# Patient Record
Sex: Male | Born: 1965 | Race: White | Hispanic: No | Marital: Married | Smoking: Never smoker
Health system: Southern US, Community
[De-identification: ages and names within clinical notes are randomized; demographics above are authoritative.]

## PROBLEM LIST (undated history)

## (undated) DIAGNOSIS — I341 Nonrheumatic mitral (valve) prolapse: Secondary | ICD-10-CM

## (undated) DIAGNOSIS — R490 Dysphonia: Secondary | ICD-10-CM

## (undated) DIAGNOSIS — M51369 Other intervertebral disc degeneration, lumbar region without mention of lumbar back pain or lower extremity pain: Secondary | ICD-10-CM

## (undated) DIAGNOSIS — K625 Hemorrhage of anus and rectum: Secondary | ICD-10-CM

## (undated) DIAGNOSIS — M1990 Unspecified osteoarthritis, unspecified site: Secondary | ICD-10-CM

## (undated) DIAGNOSIS — E785 Hyperlipidemia, unspecified: Secondary | ICD-10-CM

## (undated) DIAGNOSIS — D689 Coagulation defect, unspecified: Secondary | ICD-10-CM

## (undated) DIAGNOSIS — F329 Major depressive disorder, single episode, unspecified: Secondary | ICD-10-CM

## (undated) DIAGNOSIS — K5900 Constipation, unspecified: Secondary | ICD-10-CM

## (undated) DIAGNOSIS — M51379 Other intervertebral disc degeneration, lumbosacral region without mention of lumbar back pain or lower extremity pain: Secondary | ICD-10-CM

## (undated) DIAGNOSIS — IMO0002 Reserved for concepts with insufficient information to code with codable children: Secondary | ICD-10-CM

## (undated) DIAGNOSIS — R197 Diarrhea, unspecified: Secondary | ICD-10-CM

## (undated) DIAGNOSIS — D1720 Benign lipomatous neoplasm of skin and subcutaneous tissue of unspecified limb: Secondary | ICD-10-CM

## (undated) DIAGNOSIS — K279 Peptic ulcer, site unspecified, unspecified as acute or chronic, without hemorrhage or perforation: Secondary | ICD-10-CM

## (undated) DIAGNOSIS — R51 Headache: Secondary | ICD-10-CM

## (undated) DIAGNOSIS — D649 Anemia, unspecified: Secondary | ICD-10-CM

## (undated) DIAGNOSIS — R0602 Shortness of breath: Secondary | ICD-10-CM

## (undated) DIAGNOSIS — R140 Abdominal distension (gaseous): Secondary | ICD-10-CM

## (undated) DIAGNOSIS — F32A Depression, unspecified: Secondary | ICD-10-CM

## (undated) DIAGNOSIS — Z9889 Other specified postprocedural states: Secondary | ICD-10-CM

## (undated) DIAGNOSIS — G8929 Other chronic pain: Secondary | ICD-10-CM

## (undated) DIAGNOSIS — R112 Nausea with vomiting, unspecified: Secondary | ICD-10-CM

## (undated) DIAGNOSIS — N529 Male erectile dysfunction, unspecified: Secondary | ICD-10-CM

## (undated) DIAGNOSIS — G4733 Obstructive sleep apnea (adult) (pediatric): Secondary | ICD-10-CM

## (undated) DIAGNOSIS — M5030 Other cervical disc degeneration, unspecified cervical region: Secondary | ICD-10-CM

## (undated) DIAGNOSIS — K219 Gastro-esophageal reflux disease without esophagitis: Secondary | ICD-10-CM

## (undated) DIAGNOSIS — R39198 Other difficulties with micturition: Secondary | ICD-10-CM

## (undated) DIAGNOSIS — R1310 Dysphagia, unspecified: Secondary | ICD-10-CM

## (undated) DIAGNOSIS — R519 Headache, unspecified: Secondary | ICD-10-CM

## (undated) DIAGNOSIS — D126 Benign neoplasm of colon, unspecified: Secondary | ICD-10-CM

## (undated) DIAGNOSIS — M5135 Other intervertebral disc degeneration, thoracolumbar region: Secondary | ICD-10-CM

## (undated) DIAGNOSIS — Z8489 Family history of other specified conditions: Secondary | ICD-10-CM

## (undated) DIAGNOSIS — R002 Palpitations: Secondary | ICD-10-CM

## (undated) DIAGNOSIS — R0981 Nasal congestion: Secondary | ICD-10-CM

## (undated) DIAGNOSIS — I2699 Other pulmonary embolism without acute cor pulmonale: Secondary | ICD-10-CM

## (undated) DIAGNOSIS — M5137 Other intervertebral disc degeneration, lumbosacral region: Secondary | ICD-10-CM

## (undated) DIAGNOSIS — F419 Anxiety disorder, unspecified: Secondary | ICD-10-CM

## (undated) DIAGNOSIS — C801 Malignant (primary) neoplasm, unspecified: Secondary | ICD-10-CM

## (undated) DIAGNOSIS — Z9289 Personal history of other medical treatment: Secondary | ICD-10-CM

## (undated) DIAGNOSIS — Z800 Family history of malignant neoplasm of digestive organs: Secondary | ICD-10-CM

## (undated) DIAGNOSIS — M5136 Other intervertebral disc degeneration, lumbar region: Secondary | ICD-10-CM

## (undated) DIAGNOSIS — K921 Melena: Secondary | ICD-10-CM

## (undated) DIAGNOSIS — M549 Dorsalgia, unspecified: Secondary | ICD-10-CM

## (undated) HISTORY — DX: Headache, unspecified: R51.9

## (undated) HISTORY — DX: Anemia, unspecified: D64.9

## (undated) HISTORY — DX: Hemorrhage of anus and rectum: K62.5

## (undated) HISTORY — DX: Constipation, unspecified: K59.00

## (undated) HISTORY — DX: Other intervertebral disc degeneration, lumbar region: M51.36

## (undated) HISTORY — DX: Benign neoplasm of colon, unspecified: D12.6

## (undated) HISTORY — DX: Male erectile dysfunction, unspecified: N52.9

## (undated) HISTORY — DX: Dysphagia, unspecified: R13.10

## (undated) HISTORY — DX: Benign lipomatous neoplasm of skin and subcutaneous tissue of unspecified limb: D17.20

## (undated) HISTORY — DX: Depression, unspecified: F32.A

## (undated) HISTORY — DX: Family history of malignant neoplasm of digestive organs: Z80.0

## (undated) HISTORY — DX: Palpitations: R00.2

## (undated) HISTORY — DX: Coagulation defect, unspecified: D68.9

## (undated) HISTORY — DX: Abdominal distension (gaseous): R14.0

## (undated) HISTORY — DX: Diarrhea, unspecified: R19.7

## (undated) HISTORY — DX: Anxiety disorder, unspecified: F41.9

## (undated) HISTORY — DX: Hyperlipidemia, unspecified: E78.5

## (undated) HISTORY — PX: LUMBAR FUSION: SHX111

## (undated) HISTORY — DX: Reserved for concepts with insufficient information to code with codable children: IMO0002

## (undated) HISTORY — DX: Gastro-esophageal reflux disease without esophagitis: K21.9

## (undated) HISTORY — DX: Melena: K92.1

## (undated) HISTORY — DX: Major depressive disorder, single episode, unspecified: F32.9

## (undated) HISTORY — DX: Obstructive sleep apnea (adult) (pediatric): G47.33

## (undated) HISTORY — PX: EXCISIONAL HEMORRHOIDECTOMY: SHX1541

## (undated) HISTORY — PX: OTHER SURGICAL HISTORY: SHX169

## (undated) HISTORY — DX: Nonrheumatic mitral (valve) prolapse: I34.1

## (undated) HISTORY — DX: Headache: R51

## (undated) HISTORY — DX: Other difficulties with micturition: R39.198

## (undated) HISTORY — DX: Peptic ulcer, site unspecified, unspecified as acute or chronic, without hemorrhage or perforation: K27.9

## (undated) HISTORY — DX: Nasal congestion: R09.81

## (undated) HISTORY — DX: Dysphonia: R49.0

## (undated) HISTORY — DX: Other intervertebral disc degeneration, lumbar region without mention of lumbar back pain or lower extremity pain: M51.369

---
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
## 1986-09-06 HISTORY — PX: VAGOTOMY AND PYLOROPLASTY: SHX831

## 1992-09-06 DIAGNOSIS — K279 Peptic ulcer, site unspecified, unspecified as acute or chronic, without hemorrhage or perforation: Secondary | ICD-10-CM

## 1992-09-06 DIAGNOSIS — D1720 Benign lipomatous neoplasm of skin and subcutaneous tissue of unspecified limb: Secondary | ICD-10-CM

## 1992-09-06 HISTORY — DX: Peptic ulcer, site unspecified, unspecified as acute or chronic, without hemorrhage or perforation: K27.9

## 1992-09-06 HISTORY — DX: Benign lipomatous neoplasm of skin and subcutaneous tissue of unspecified limb: D17.20

## 1993-01-30 ENCOUNTER — Encounter: Payer: Self-pay | Admitting: Gastroenterology

## 1998-07-16 ENCOUNTER — Encounter: Admission: RE | Admit: 1998-07-16 | Discharge: 1998-07-16 | Payer: Self-pay | Admitting: Family Medicine

## 1998-07-17 ENCOUNTER — Encounter: Admission: RE | Admit: 1998-07-17 | Discharge: 1998-10-15 | Payer: Self-pay

## 1998-07-28 ENCOUNTER — Encounter: Admission: RE | Admit: 1998-07-28 | Discharge: 1998-07-28 | Payer: Self-pay | Admitting: Family Medicine

## 1998-08-18 ENCOUNTER — Encounter: Admission: RE | Admit: 1998-08-18 | Discharge: 1998-08-18 | Payer: Self-pay | Admitting: Family Medicine

## 1998-11-03 ENCOUNTER — Encounter: Admission: RE | Admit: 1998-11-03 | Discharge: 1998-11-03 | Payer: Self-pay | Admitting: Family Medicine

## 1999-06-15 ENCOUNTER — Encounter: Admission: RE | Admit: 1999-06-15 | Discharge: 1999-06-15 | Payer: Self-pay | Admitting: Family Medicine

## 2001-03-20 ENCOUNTER — Encounter: Payer: Self-pay | Admitting: Internal Medicine

## 2001-03-20 ENCOUNTER — Encounter: Admission: RE | Admit: 2001-03-20 | Discharge: 2001-03-20 | Payer: Self-pay | Admitting: Internal Medicine

## 2006-09-06 HISTORY — PX: TONSILLECTOMY: SUR1361

## 2007-06-27 ENCOUNTER — Ambulatory Visit: Payer: Self-pay | Admitting: Gastroenterology

## 2007-07-08 HISTORY — PX: COLONOSCOPY: SHX174

## 2007-07-19 ENCOUNTER — Ambulatory Visit: Payer: Self-pay | Admitting: Gastroenterology

## 2007-07-19 ENCOUNTER — Encounter: Payer: Self-pay | Admitting: Gastroenterology

## 2007-11-03 DIAGNOSIS — K921 Melena: Secondary | ICD-10-CM

## 2007-11-03 DIAGNOSIS — M129 Arthropathy, unspecified: Secondary | ICD-10-CM | POA: Insufficient documentation

## 2007-11-03 DIAGNOSIS — K621 Rectal polyp: Secondary | ICD-10-CM

## 2007-11-03 DIAGNOSIS — K644 Residual hemorrhoidal skin tags: Secondary | ICD-10-CM | POA: Insufficient documentation

## 2007-11-03 DIAGNOSIS — K620 Anal polyp: Secondary | ICD-10-CM | POA: Insufficient documentation

## 2007-11-03 DIAGNOSIS — F411 Generalized anxiety disorder: Secondary | ICD-10-CM | POA: Insufficient documentation

## 2007-11-03 DIAGNOSIS — Z8711 Personal history of peptic ulcer disease: Secondary | ICD-10-CM

## 2007-11-03 DIAGNOSIS — K648 Other hemorrhoids: Secondary | ICD-10-CM | POA: Insufficient documentation

## 2009-01-21 ENCOUNTER — Encounter: Admission: RE | Admit: 2009-01-21 | Discharge: 2009-01-21 | Payer: Self-pay | Admitting: Neurological Surgery

## 2009-09-03 ENCOUNTER — Ambulatory Visit (HOSPITAL_COMMUNITY): Admission: RE | Admit: 2009-09-03 | Discharge: 2009-09-04 | Payer: Self-pay | Admitting: Neurological Surgery

## 2009-10-13 ENCOUNTER — Encounter: Admission: RE | Admit: 2009-10-13 | Discharge: 2009-10-13 | Payer: Self-pay | Admitting: Neurological Surgery

## 2009-12-08 ENCOUNTER — Encounter: Admission: RE | Admit: 2009-12-08 | Discharge: 2009-12-08 | Payer: Self-pay | Admitting: Neurological Surgery

## 2010-03-16 ENCOUNTER — Encounter: Admission: RE | Admit: 2010-03-16 | Discharge: 2010-03-16 | Payer: Self-pay | Admitting: Neurological Surgery

## 2010-09-06 HISTORY — PX: POSTERIOR CERVICAL FUSION/FORAMINOTOMY: SHX5038

## 2010-09-27 ENCOUNTER — Encounter (HOSPITAL_BASED_OUTPATIENT_CLINIC_OR_DEPARTMENT_OTHER): Payer: Self-pay | Admitting: Internal Medicine

## 2010-09-28 ENCOUNTER — Encounter: Payer: Self-pay | Admitting: Neurological Surgery

## 2010-10-26 ENCOUNTER — Other Ambulatory Visit: Payer: Self-pay | Admitting: Neurological Surgery

## 2010-10-26 DIAGNOSIS — M545 Low back pain: Secondary | ICD-10-CM

## 2010-10-30 ENCOUNTER — Ambulatory Visit
Admission: RE | Admit: 2010-10-30 | Discharge: 2010-10-30 | Disposition: A | Discharge status: Home / Self Care | Payer: BC Managed Care – PPO | Source: Ambulatory Visit | Attending: Neurological Surgery | Admitting: Neurological Surgery

## 2010-10-30 DIAGNOSIS — M545 Low back pain: Secondary | ICD-10-CM

## 2010-12-07 LAB — BASIC METABOLIC PANEL
CO2: 26 mEq/L (ref 19–32)
Calcium: 9.2 mg/dL (ref 8.4–10.5)
Chloride: 105 mEq/L (ref 96–112)
GFR calc Af Amer: >60 mL/min (ref >60)
Potassium: 4.1 mEq/L (ref 3.5–5.1)
Sodium: 139 mEq/L (ref 135–145)

## 2010-12-07 LAB — DIFFERENTIAL
Basophils Relative: 1 % (ref 0–1)
Eosinophils Relative: 7 % — ABNORMAL HIGH (ref 0–5)
Monocytes Absolute: 0.6 10*3/uL (ref 0.1–1.0)
Monocytes Relative: 8 % (ref 3–12)
Neutro Abs: 4.6 10*3/uL (ref 1.7–7.7)

## 2010-12-07 LAB — CBC
HCT: 43.4 % (ref 39.0–52.0)
Hemoglobin: 14.9 g/dL (ref 13.0–17.0)
MCHC: 34.4 g/dL (ref 30.0–36.0)
RBC: 4.73 MIL/uL (ref 4.22–5.81)

## 2010-12-07 LAB — APTT: aPTT: 29 seconds (ref 24–37)

## 2011-01-19 NOTE — Assessment & Plan Note (Signed)
[name]                         GASTROENTEROLOGY OFFICE NOTE   Paul Griffin, Paul Griffin                  MRN:          [id number]  DATE:06/27/2007                            DOB:          Aug 13, 1966    REFERRING PHYSICIAN:  Barry Griffin. Paul Griffin, M.D.   REASON FOR CONSULTATION:  Hematochezia.   HISTORY OF PRESENT ILLNESS:  Mr. Paul Griffin is a 45 year old white male  that I have previously evaluated.  I saw him during a hospitalization in  May of 1994 for a bleeding duodenal ulcer and he eventually underwent  vagotomy and pyloroplasty.  He relates frequent problems with heartburn  and indigestion.  He notes long-term problems with alternating diarrhea  and constipation, but these symptoms have not changed for some time.  He  has had rare episodes of small amounts of bright red blood per rectum  over the past ten to twelve years that he has attributed to hemorrhoids.  Over the past two to three months, he has had about ten episodes of  moderate amounts of bright red blood per rectum in the commode,  associated with bowel movements.  He relates no rectal pain or abdominal  pain.  He previously underwent colonoscopy by Dr. Danise Griffin in  April of 1992 that showed small hyperplastic polyps in the rectum.  He  subsequently underwent colonoscopy by me in March of 2004 for  hematochezia.  Polypoid areas in the colon were removed, which turned  out to be areas of enlarged lymphoid aggregates, but adenomatous polyps  or hyperplastic polyps were not noted.  Internal and external  hemorrhoids were noted on that procedure.  There is no family history of  colon cancer, but he thinks there may be a relative or two on his  mother's side with colon cancer.   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   CURRENT  MEDICATIONS:  1. Celebrex 200 mg daily.  2. Zoloft 100 mg daily.  3. Ultram p.r.n.   MEDICATION ALLERGIES:  None known.   SOCIAL HISTORY:  Per the handwritten form.   REVIEW OF SYSTEMS:  Per the handwritten form.   PHYSICAL EXAM:  Well-developed, mildly overweight white male, in no  acute distress.  Height 5 feet 11 inches, weight 200.6 pounds.  Blood pressure is 98/60,  pulse 80 and regular.  HEENT EXAM:  Anicteric sclerae.  Oropharynx clear.  CHEST:  Clear to auscultation bilaterally.  CARDIAC:  Regular rate and rhythm without murmurs appreciated.  ABDOMEN:  Soft, nontender, nondistended.  Normoactive bowel sounds.  No  palpable organomegaly, masses or hernias.  RECTAL EXAMINATION:  Deferred to time of colonoscopy.  EXTREMITIES:  Without clubbing, cyanosis or edema.  NEUROLOGIC:  Alert and oriented times three, grossly nonfocal.   ASSESSMENT AND PLAN:  Moderate volume hematochezia with a history of  internal and external hemorrhoids.  He has not had adenomatous polyps  diagnosed.  He has long-term alternating diarrhea and constipation,  consistent with irritable bowel syndrome.  He is to substantially  increase his fiber and fluid intake.  Risks, benefits, and alternatives  to colonoscopy and possible biopsy, possible polypectomy and possible  destruction of internal hemorrhoids discussed with the patient.  He  consents to proceed.  This will be scheduled electively.     Venita Lick. Paul Dar, MD, [facility]  Electronically Signed    MTS/MedQ  DD: 06/27/2007  DT: 06/28/2007  Job #: [id number]

## 2012-06-19 ENCOUNTER — Encounter: Payer: Self-pay | Admitting: Gastroenterology

## 2012-06-27 ENCOUNTER — Encounter: Payer: Self-pay | Admitting: Gastroenterology

## 2012-08-06 DIAGNOSIS — D126 Benign neoplasm of colon, unspecified: Secondary | ICD-10-CM

## 2012-08-06 HISTORY — DX: Benign neoplasm of colon, unspecified: D12.6

## 2012-08-14 ENCOUNTER — Ambulatory Visit (AMBULATORY_SURGERY_CENTER): Payer: BC Managed Care – PPO | Admitting: *Deleted

## 2012-08-14 VITALS — Ht 71.0 in | Wt 203.0 lb

## 2012-08-14 DIAGNOSIS — Z1211 Encounter for screening for malignant neoplasm of colon: Secondary | ICD-10-CM

## 2012-08-14 MED ORDER — MOVIPREP 100 G PO SOLR: ORAL | Status: DC

## 2012-09-05 ENCOUNTER — Encounter: Payer: Self-pay | Admitting: Gastroenterology

## 2012-09-05 ENCOUNTER — Ambulatory Visit (AMBULATORY_SURGERY_CENTER): Payer: BC Managed Care – PPO | Admitting: Gastroenterology

## 2012-09-05 VITALS — BP 108/74 | HR 58 | Temp 97.5°F | Resp 12 | Ht 71.0 in | Wt 203.0 lb

## 2012-09-05 DIAGNOSIS — D126 Benign neoplasm of colon, unspecified: Secondary | ICD-10-CM

## 2012-09-05 DIAGNOSIS — Z86010 Personal history of colonic polyps: Secondary | ICD-10-CM

## 2012-09-05 DIAGNOSIS — Z1211 Encounter for screening for malignant neoplasm of colon: Secondary | ICD-10-CM

## 2012-09-05 MED ORDER — SODIUM CHLORIDE 0.9 % IV SOLN: 500.0000 mL | INTRAVENOUS | Status: DC

## 2012-09-05 NOTE — Op Note (Signed)
[facility] [address] [place], [address]   COLONOSCOPY PROCEDURE REPORT  PATIENT: Paul Griffin, Paul Griffin  MR#: [id number] BIRTHDATE: 1966/03/30 , 46  yrs. old GENDER: Male ENDOSCOPIST: Meryl Dare, MD, [facility] PROCEDURE DATE:  09/05/2012 PROCEDURE:   Colonoscopy with snare polypectomy and Colonoscopy with biopsy ASA CLASS:   Class II INDICATIONS:Patient's personal history of multiple hyperplastic colon polyps. MEDICATIONS: MAC sedation, administered by CRNA and propofol (Diprivan) 400mg  IV  DESCRIPTION OF PROCEDURE:   After the risks benefits and alternatives of the procedure were thoroughly explained, informed consent was obtained.  A digital rectal exam revealed no abnormalities of the rectum.   The Fuse-Demo-Scope  endoscope was introduced through the anus and advanced to the cecum, which was identified by both the appendix and ileocecal valve. No adverse events experienced.   The quality of the prep was adequate, using MoviPrep  The instrument was then slowly withdrawn as the colon was fully examined.   COLON FINDINGS: Two sessile polyps measuring 5-6 mm in size were found at the cecum.  A polypectomy was performed with a cold snare. The resection was complete and the polyp tissue was completely retrieved.   Two sessile polyps measuring 6-7 mm in size were found in the transverse colon.  A polypectomy was performed with a cold snare.  The resection was complete and the polyp tissue was completely retrieved.  Three sessile polyps ranging between 3-4 mm in size were found in the transverse colon.  A polypectomy was performed with cold forceps.  The resection was complete and the polyp tissue was completely retrieved.   A sessile polyp measuring 6 mm in size was found in the descending colon.  A polypectomy was performed with a cold snare.  The resection was complete and the polyp tissue was completely retrieved. The colon was otherwise normal.  There  was no diverticulosis, inflammation, polyps or cancers unless previously stated.  Images taken but only available in hard copy form that will be scanned into EP[employer]). Retroflexed views revealed internal hemorrhoids. The time to cecum=2 minutes 00 seconds.  Withdrawal time=20 minutes 06 seconds. The scope was withdrawn and the procedure completed.  COMPLICATIONS: There were no complications.  ENDOSCOPIC IMPRESSION: 1.   Two sessile polyps measuring 5-6 mm at the cecum; polypectomy performed with a cold snare 2.   Two sessile polyps measuring 6-7 mm in the transverse colon; polypectomy performed with a cold snare 3.   Three sessile polyps ranging between 3-4 mm in the transverse colon; polypectomy performed with cold forceps 4.   Sessile polyp measuring 6 mm in the descending colon; polypectomy performed with a cold snare 5.   Internal hemorrhoids  RECOMMENDATIONS: 1.  Await pathology results 2.  Repeat Colonoscopy in 5 years with a more extensive bowel   eSigned:  Meryl Dare, MD, [facility] 09/05/2012 10:43 AM   cc: Jarome Matin, MD

## 2012-09-05 NOTE — Progress Notes (Signed)
Patient did not experience any of the following events: a burn prior to discharge; a fall within the facility; wrong site/side/patient/procedure/implant event; or a hospital transfer or hospital admission upon discharge from the facility. (G8907) Patient did not have preoperative order for IV antibiotic SSI prophylaxis. (G8918)  

## 2012-09-05 NOTE — Patient Instructions (Addendum)

## 2012-09-05 NOTE — Progress Notes (Signed)
Pt in recovery complaining of a headache he states he has had since this am. emw

## 2012-09-07 ENCOUNTER — Telehealth: Payer: Self-pay | Admitting: *Deleted

## 2012-09-07 NOTE — Telephone Encounter (Signed)
Left message that we called for f/u 

## 2012-09-12 ENCOUNTER — Encounter: Payer: Self-pay | Admitting: Gastroenterology

## 2012-10-12 ENCOUNTER — Telehealth (INDEPENDENT_AMBULATORY_CARE_PROVIDER_SITE_OTHER): Payer: Self-pay | Admitting: General Surgery

## 2012-10-12 NOTE — Telephone Encounter (Signed)
Called and left message for Malachi Bonds at [facility] to request the office notes and any test results on this patient who was referred by Dr. Jarold Motto. Patient scheduled to be seen by Dr. Derrell Lolling on 10/20/12 at 3:30.

## 2012-10-18 ENCOUNTER — Encounter (INDEPENDENT_AMBULATORY_CARE_PROVIDER_SITE_OTHER): Payer: Self-pay | Admitting: General Surgery

## 2012-10-20 ENCOUNTER — Other Ambulatory Visit (INDEPENDENT_AMBULATORY_CARE_PROVIDER_SITE_OTHER): Payer: Self-pay | Admitting: General Surgery

## 2012-10-20 ENCOUNTER — Encounter (INDEPENDENT_AMBULATORY_CARE_PROVIDER_SITE_OTHER): Payer: Self-pay | Admitting: General Surgery

## 2012-10-20 ENCOUNTER — Telehealth (INDEPENDENT_AMBULATORY_CARE_PROVIDER_SITE_OTHER): Payer: Self-pay | Admitting: General Surgery

## 2012-10-20 ENCOUNTER — Ambulatory Visit (INDEPENDENT_AMBULATORY_CARE_PROVIDER_SITE_OTHER): Payer: BC Managed Care – PPO | Admitting: General Surgery

## 2012-10-20 VITALS — BP 122/70 | HR 74 | Temp 98.0°F | Resp 18 | Ht 71.0 in | Wt 206.2 lb

## 2012-10-20 DIAGNOSIS — K432 Incisional hernia without obstruction or gangrene: Secondary | ICD-10-CM

## 2012-10-20 NOTE — Progress Notes (Addendum)
[name], male   DOB: Feb 06, 1966, 47 y.o.   MRN: [id number]  Chief Complaint  Patient presents with  . Incisional Hernia    HPI Paul Griffin is a 47 y.o. male.  He is referred by Dr. Jarome Matin for evaluation of an incisional hernia.  This patient underwent surgery by me 20 years ago for recurrent duodenal ulcer. He states that I did a vagotomy and pyloroplasty. I do not have those records. He is followed by Dr. Claudette Head for irritable bowel syndrome and gets colonoscopies periodically. Last colonoscopy was 09/05/2012 with findings of a villous adenoma without dysplasia. He has never had another endoscopy.  He has noted a bulge in his upper midline in his incision for about 1 year. He says it has doubled in size over the past 8 months, and he pointed it out to Dr. Jarold Motto. Dr. Jarold Motto referred him for my opinion.  He also has symptoms of postprandial food trapping in the stomach, thinks his stomach doesn't empty, only slowly passes food out. Denies nausea or vomiting. He does have irritable bowel syndrome with alternating diarrhea and constipation and intermittent mid and lower abdominal cramps.  He is in no distress today  Comorbidities include a history of bleeding duodenal ulcer 1994, hemorrhoids, cervical disc surgery 2010, mild obstructive sleep apnea, erectile dysfunction, and chronic anxiety. Family history is positive for lung cancer, his mother died of metastatic colon cancer and breast cancer. HPI  Past Medical History  Diagnosis Date  . Arthritis   . Anxiety     panic attacks  . Ulcer     gastric 1988  . Low back pain   . Obstructive sleep apnea     miald w aith RDI of 6.4 per hour  . Chronic neck pain   . Peptic ulcer 1994    severe requiring vagotomy and pyloroplasty  . Hoarseness of voice     chronic  . Dyslipidemia   . Erectile dysfunction     organic  . Family history of colon cancer   . Lipoma of arm 1994    right  .  Blood transfusion without reported diagnosis   . Constipation   . Diarrhea   . Nasal congestion   . Trouble swallowing   . Palpitations   . Abdominal distension   . Rectal bleeding   . Blood in stool   . Generalized headaches   . Difficulty urinating     Past Surgical History  Procedure Laterality Date  . Stomach surgery  1988    ulcers  . Colonoscopy  07/2007    showed polyps, follow up in 2013  . Tonsillectomy  2008  . Hernia repair  1994    Family History  Problem Relation Age of Onset  . Colon cancer Mother   . Cancer Mother   . COPD Mother     colon  . Breast cancer Sister   . Ovarian cancer Sister   . Cancer Sister     breast  . Cancer Sister     ovarian  . Cancer Father     kidney    Social History History  Substance Use Topics  . Smoking status: Never Smoker   . Smokeless tobacco: Never Used  . Alcohol Use: Yes     Comment: rarely    Allergies  Allergen Reactions  . Morphine And Related Other (See Comments)    hallucinations    Current Outpatient Prescriptions  Medication Sig Dispense Refill  .  LORazepam (ATIVAN) 0.5 MG tablet as needed.      . celecoxib (CELEBREX) 200 MG capsule Take 200 mg by mouth 2 (two) times daily.      [name] HYDROcodone-acetaminophen (VICODIN) 5-500 MG per tablet Take 1 tablet by mouth every 6 (six) hours as needed. pain      . metaxalone (SKELAXIN) 800 MG tablet Take 800 mg by mouth as needed.      . sertraline (ZOLOFT) 100 MG tablet Take 1 tablet by mouth Daily.       No current facility-administered medications for this visit.    Review of Systems Review of Systems  Constitutional: Negative for fever, chills and unexpected weight change.  HENT: Positive for neck pain and neck stiffness. Negative for hearing loss, congestion, sore throat, trouble swallowing and voice change.   Eyes: Negative for visual disturbance.  Respiratory: Negative for cough and wheezing.   Cardiovascular: Negative for chest pain, palpitations  and leg swelling.  Gastrointestinal: Positive for abdominal pain, diarrhea and constipation. Negative for nausea, vomiting, blood in stool, abdominal distention, anal bleeding and rectal pain.  Genitourinary: Negative for hematuria and difficulty urinating.  Musculoskeletal: Negative for arthralgias.  Skin: Negative for rash and wound.  Neurological: Negative for seizures, syncope, weakness and headaches.  Hematological: Negative for adenopathy. Does not bruise/bleed easily.  Psychiatric/Behavioral: Negative for confusion. The patient is nervous/anxious.     Blood pressure 122/70, pulse 74, temperature 98 F (36.7 C), temperature source Temporal, resp. rate 18, height 5\' 11"  (1.803 m), weight 206 lb 4 oz (93.554 kg).  Physical Exam Physical Exam  Constitutional: He is oriented to person, place, and time. He appears well-developed and well-nourished. No distress.  HENT:  Head: Normocephalic.  Nose: Nose normal.  Mouth/Throat: No oropharyngeal exudate.  Eyes: Conjunctivae and EOM are normal. Pupils are equal, round, and reactive to light. Right eye exhibits no discharge. Left eye exhibits no discharge. No scleral icterus.  Neck: Normal range of motion. Neck supple. No JVD present. No tracheal deviation present. No thyromegaly present.  Cardiovascular: Normal rate, regular rhythm, normal heart sounds and intact distal pulses.   No murmur heard. Pulmonary/Chest: Effort normal and breath sounds normal. No stridor. No respiratory distress. He has no wheezes. He has no rales. He exhibits no tenderness.  Abdominal: Soft. Bowel sounds are normal. He exhibits no distension and no mass. There is no tenderness. There is no rebound and no guarding.    Midline incision from xiphoid to just above umbilicus. Small reducible hernia midway between xiphoid and umbilicus. Defect probably 3-5 cm at most. Nontender. Also a small umbilical hernia.  Musculoskeletal: Normal range of motion. He exhibits no edema  and no tenderness.  Lymphadenopathy:    He has no cervical adenopathy.  Neurological: He is alert and oriented to person, place, and time. He has normal reflexes. Coordination normal.  Skin: Skin is warm and dry. No rash noted. He is not diaphoretic. No erythema. No pallor.  Psychiatric: He has a normal mood and affect. His behavior is normal. Judgment and thought content normal.  He seems mildly anxious    Data Reviewed Dr. Silvano Rusk notes. Hospital records.  Assessment    Incisional hernia, upper midline, reducible, defect 5 cm or less. This is enlarging by history.Elective laparoscopic repair with mesh is feasible.  Umbilical hernia, asymptomatic  Postprandial fullness, early satiety, delayed emptying.  Irritable bowel syndrome, followed by Dr. Russella Dar  Adenomatous colon polyps, gets periodic colonoscopy by Dr. Russella Dar  Chronic anxiety  History  cervical disc surgery 2010  History of definitive gastric surgery for bleeding duodenal ulcer by me, 1994. We'll try to get all records  Mild obstructive sleep apnea     Plan    We are going to get an upper GI study before doing anything, because his symptoms of postprandial fullness and delayed gastric emptying are probably not due to the hernia. They may be functional but because of his history of ulcer I to be sure there's not a mechanical gastric problem.. He has never had an endoscopy, according to him, since his ulcer surgery  Return to see me in 2-3 weeks after the upper GI. Hopefully we can make definitive plans regarding laparoscopic repair of his incisional hernia. We can probably cover the umbilical hernia with mesh at the same time.  I gave him booklets and information about hernia surgery.        Angelia Mould. Derrell Lolling, M.D., [facility] General and Minimally invasive Surgery Breast and Colorectal Surgery Office:   [phone] Pager:   [phone]  10/20/2012, 4:06 PM

## 2012-10-20 NOTE — Telephone Encounter (Signed)
Called patient and left message on cell number requesting return call to confirm he will arrive on time for his appointment this afternoon. Requested patient arrive at clinic at 3:00 for the 3:30 appointment. Tried patient work number and unable to leave a message. Line rang continuously without pick up.

## 2012-10-20 NOTE — Patient Instructions (Signed)
You have a small to medium size incisional hernia, and you also have a small umbilical hernia. You should consider having these repaired electively before they get much bigger.  They can probably be repaired laparoscopically through small puncture wounds. Please read over the information booklet that I gave you.  Your symptoms of not emptying your stomach and the trapping of food  In your stomach after eating is probably not from  your hernia. It may be due to irritable bowel syndrome.  You will be scheduled for an upper GI x-ray to make sure that your stomach empties properly.  You will be scheduled to see Dr. Derrell Lolling in the office after the upper GI test to make final decisions about your incisional hernia repair.

## 2012-10-23 ENCOUNTER — Telehealth: Payer: Self-pay | Admitting: Gastroenterology

## 2012-10-23 ENCOUNTER — Telehealth (INDEPENDENT_AMBULATORY_CARE_PROVIDER_SITE_OTHER): Payer: Self-pay | Admitting: General Surgery

## 2012-10-23 NOTE — Telephone Encounter (Signed)
Called patient and left message to advise the test ordered by Dr. Derrell Lolling was scheduled for 11/03/12 at 7:45 am. Advised in message that I was unable to obtain an afternoon appointment as this test is only am. Gave contact number for [facility] in order for the patient to call them back directly if appointment needs to be changed based on his work schedule. Test to be done at the [address] location.

## 2012-10-23 NOTE — Telephone Encounter (Signed)
Patient's wife advised that he will need an office visit to discuss.  Patient has seen Dr. Russella Dar for a colonoscopy in 08/2012 and Dr. Russella Dar recommended an office visit.  He will come in and see Dr. Russella Dar on 11/06/12 2:00.  His wife is asking if he needs UGI series if Dr. Russella Dar performs the endoscopy.  I explained that they are different diagnostic tests and Dr. Derrell Lolling will want them to have this done.  The wife wants to wait until he sees Dr. Russella Dar to decide, she is advised to discuss with Dr. Jacinto Halim office for that

## 2012-11-03 ENCOUNTER — Ambulatory Visit
Admission: RE | Admit: 2012-11-03 | Discharge: 2012-11-03 | Disposition: A | Discharge status: Home / Self Care | Payer: BC Managed Care – PPO | Source: Ambulatory Visit | Attending: General Surgery | Admitting: General Surgery

## 2012-11-03 ENCOUNTER — Other Ambulatory Visit (INDEPENDENT_AMBULATORY_CARE_PROVIDER_SITE_OTHER): Payer: Self-pay | Admitting: General Surgery

## 2012-11-03 DIAGNOSIS — K432 Incisional hernia without obstruction or gangrene: Secondary | ICD-10-CM

## 2012-11-06 ENCOUNTER — Ambulatory Visit (INDEPENDENT_AMBULATORY_CARE_PROVIDER_SITE_OTHER): Payer: BC Managed Care – PPO | Admitting: Gastroenterology

## 2012-11-06 ENCOUNTER — Encounter: Payer: Self-pay | Admitting: Gastroenterology

## 2012-11-06 VITALS — BP 92/70 | HR 76 | Ht 69.5 in | Wt 206.5 lb

## 2012-11-06 DIAGNOSIS — R1013 Epigastric pain: Secondary | ICD-10-CM

## 2012-11-06 DIAGNOSIS — K3189 Other diseases of stomach and duodenum: Secondary | ICD-10-CM

## 2012-11-06 DIAGNOSIS — K432 Incisional hernia without obstruction or gangrene: Secondary | ICD-10-CM

## 2012-11-06 DIAGNOSIS — R933 Abnormal findings on diagnostic imaging of other parts of digestive tract: Secondary | ICD-10-CM

## 2012-11-06 MED ORDER — FLUCONAZOLE 100 MG PO TABS: 100.0000 mg | ORAL_TABLET | Freq: Every day | ORAL | Status: DC

## 2012-11-06 MED ORDER — OMEPRAZOLE 40 MG PO CPDR: 40.0000 mg | DELAYED_RELEASE_CAPSULE | Freq: Every day | ORAL | Status: DC

## 2012-11-06 NOTE — Patient Instructions (Addendum)
We have sent the following medications to your pharmacy for you to pick up at your convenience: Diflucan, omeprazole.  You have been scheduled for an endoscopy with propofol. Please follow written instructions given to you at your visit today. If you use inhalers (even only as needed), please bring them with you on the day of your procedure.  You have been given a Gastroparesis diet.   Thank you for choosing me and [facility].  Venita Lick. Pleas Koch., MD., [facility]  cc: Jarome Matin, MD

## 2012-11-06 NOTE — Progress Notes (Addendum)
History of Present Illness: This is a 47 year old male accompanied by his wife, He has chronic complaints of postprandial gas bloating and urgent stools. In addition he has occasional episodes of constipation. He underwent duodenal ulcer surgery by Dr. Derrell Lolling in 1994 for a bleeding duodenal ulcer. Recently he has noted more problems with postprandial fullness after larger meals. He underwent colonoscopy in December 2013 with small colon polyps found. An upper GI series was performed on February 28. Denies weight loss, abdominal pain, change in stool caliber, melena, hematochezia, nausea, vomiting, dysphagia, reflux symptoms, chest pain.  UGI series: 1. Granular appearance of the mucosa of the esophagus highly suspicious for esophagitis, possibly monilial in origin.  2. Mild gastroesophageal reflux.  3. Some retained food debris within the stomach but no ulceration is seen.  4. Mild tertiary contractions within the distal esophagus.   Current Medications, Allergies, Past Medical History, Past Surgical History, Family History and Social History were reviewed in [employer] record.  Physical Exam: General: Well developed , well nourished, no acute distress Head: Normocephalic and atraumatic Eyes:  sclerae anicteric, EOMI Ears: Normal auditory acuity Mouth: No deformity or lesions Lungs: Clear throughout to auscultation Heart: Regular rate and rhythm; no murmurs, rubs or bruits Abdomen: Soft, non tender and non distended. No masses, hepatosplenomegaly or hernias noted. Normal Bowel sounds Musculoskeletal: Symmetrical with no gross deformities  Pulses:  Normal pulses noted Extremities: No clubbing, cyanosis, edema or deformities noted Neurological: Alert oriented x 4, grossly nonfocal Psychological:  Alert and cooperative. Normal mood and affect  Assessment and Recommendations:  1. Presumed gastroparesis, GERD and possibly esophagitis. Prior ulcer surgery. 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 Possible irritable bowel syndrome. Complete course of Diflucan. Begin omeprazole 40 mg daily for long-term use. Begin standard antireflux measures. Begin standard gastroparesis diet. Schedule upper endoscopy. The risks, benefits, and alternatives to endoscopy with possible biopsy and possible dilation were discussed with the patient and they consent to proceed.   2. Incisional hernia. Per Dr Derrell Lolling.  3. Personal history of adenomatous colon polyps. Surveillance colonoscopy recommended December 2018.

## 2012-11-14 ENCOUNTER — Encounter (INDEPENDENT_AMBULATORY_CARE_PROVIDER_SITE_OTHER): Payer: Self-pay | Admitting: General Surgery

## 2012-11-14 ENCOUNTER — Ambulatory Visit (INDEPENDENT_AMBULATORY_CARE_PROVIDER_SITE_OTHER): Payer: BC Managed Care – PPO | Admitting: General Surgery

## 2012-11-14 VITALS — BP 98/66 | HR 68 | Temp 97.1°F | Resp 16 | Ht 69.0 in | Wt 203.0 lb

## 2012-11-14 DIAGNOSIS — K432 Incisional hernia without obstruction or gangrene: Secondary | ICD-10-CM

## 2012-11-14 NOTE — Progress Notes (Signed)
[name], male   DOB: June 09, 1966, 47 y.o.   MRN: [id number] History: This patient returns to see me for further discussion regarding his ventral incisional hernia, umbilical hernia, and other GI problems. His wife is with him today. Because of his symptoms of postprandial air-trapping and early satiety, and because of his past history of of a vagotomy and pyloroplasty(presumably) for duodenal ulcer, an upper GI was performed. The upper GI shows esophagitis, possible monilial, mild GERD, and residual debris in the stomach.  No significant stricture. He has seen Dr. Russella Dar on March 3 who felt that he had GERD, possible dumping syndrome, possible irritable bowel syndrome, Possible gastroparesis. The patient was started on Diflucan and omeprazole, and is scheduled for upper endoscopy on March 24. In terms of his ventral hernia, he is  having some pain but not much. It really bothers him that bulges out and he wants something done. He would like to do the surgery at the end of April, if possible    ROS: 10 system review of systems is performed. Reveals anxiety, early satiety, food trapping, abdominal bulge and discomfort and is otherwise negative.    Exam: Patient looks well. A little anxious with a little pressured speech but very appropriate and cooperative. Neck no adenopathy or masses Lungs clear to auscultation bilaterally. Heart regular rate and rhythm. No ectopy. No murmur Abdomen examined supine and standing. Upper midline incision. One or 2 defects in the midline incision, not large, certainly smaller than 5 cm, and also small umbilical hernia.    Assessment: Incisional hernia, upper midline, reducible. There may be more than one defects but these are not very large. I think elective laparoscopic repair with mesh as feasible. Umbilical hernia, asymptomatic Irritable bowel syndrome Gastroparesis Adenomatous colon polyps, last colonoscopy December 2013 Family history  colon cancer in mother Chronic anxiety History cervical disc surgery 2010   history of definitive gastric surgery for bleeding duodenal ulcer by May 1994. We are still looking for the old chart of this surgery  mild obstructive sleep apnea    Plan: Schedule CT scan of Abd/pelvis to map out the hernia defects. Upper endoscopy March 24 Return to see me after upper endoscopy for final discussion and planning of his laparoscopic ventral incisional hernia repair with mesh, possible open. We discussed disabilities and out of work issues regarding this surgery. I discussed techniques and the details and numerous risks. He and his wife understand all these issues.   Angelia Mould. Derrell Lolling, M.D., [facility] General and Minimally invasive Surgery Breast and Colorectal Surgery Office:   [phone] Pager:   [phone]

## 2012-11-14 NOTE — Patient Instructions (Signed)
Your upper GI shows that you probably have esophagitis and mild reflux. There was also some food left over in your stomach,  which may be due to a condition called gastroparesis.  You are scheduled for an upper endoscopy on March 24 by Dr. Russella Dar. Be sure to go ahead with that test.  In terms of your ventral hernia, you will be scheduled for a CT scan of the abdomen and pelvis to be sure we understand the anatomy of your hernias.  Return to see Dr. Derrell Lolling before the end of March for a final discussion and planning for your ventral hernia surgery.  Once you have the surgery for your ventral hernia, you will not be able to play sports or lift anything more than 20 pounds for 5-6 weeks. After that time you will be able to resume full duty.

## 2012-11-15 ENCOUNTER — Telehealth (INDEPENDENT_AMBULATORY_CARE_PROVIDER_SITE_OTHER): Payer: Self-pay | Admitting: General Surgery

## 2012-11-15 DIAGNOSIS — K432 Incisional hernia without obstruction or gangrene: Secondary | ICD-10-CM

## 2012-11-15 NOTE — Telephone Encounter (Signed)
Patient calling because he spoke with his insurance company about his CT scan and it will be much cheaper for him to have this done at [facility] and he spoke with them and they can get this scheduled for tomorrow. I put in a new order for [facility] and order was given to our referral coordinator to set up. CT was cancelled for [facility]. Patient aware he will get a CD of images to bring to our office from [facility].

## 2012-11-16 ENCOUNTER — Other Ambulatory Visit: Payer: BC Managed Care – PPO

## 2012-11-28 ENCOUNTER — Ambulatory Visit (AMBULATORY_SURGERY_CENTER): Payer: BC Managed Care – PPO | Admitting: Gastroenterology

## 2012-11-28 ENCOUNTER — Encounter: Payer: Self-pay | Admitting: Gastroenterology

## 2012-11-28 VITALS — BP 106/69 | HR 62 | Temp 96.1°F | Resp 17 | Ht 69.0 in | Wt 206.0 lb

## 2012-11-28 DIAGNOSIS — R1013 Epigastric pain: Secondary | ICD-10-CM

## 2012-11-28 DIAGNOSIS — K209 Esophagitis, unspecified: Secondary | ICD-10-CM

## 2012-11-28 DIAGNOSIS — R933 Abnormal findings on diagnostic imaging of other parts of digestive tract: Secondary | ICD-10-CM

## 2012-11-28 DIAGNOSIS — K432 Incisional hernia without obstruction or gangrene: Secondary | ICD-10-CM

## 2012-11-28 DIAGNOSIS — K3189 Other diseases of stomach and duodenum: Secondary | ICD-10-CM

## 2012-11-28 MED ORDER — SODIUM CHLORIDE 0.9 % IV SOLN: 500.0000 mL | INTRAVENOUS | Status: DC

## 2012-11-28 NOTE — Op Note (Signed)
[facility] [address] [place], [address]   ENDOSCOPY PROCEDURE REPORT  PATIENT: Paul Griffin, Paul Griffin  MR#: [id number] BIRTHDATE: 09/26/1965 , 46  yrs. old GENDER: Male ENDOSCOPIST: Meryl Dare, MD, [facility] PROCEDURE DATE:  11/28/2012 PROCEDURE:  EGD w/ biopsy ASA CLASS:     Class II INDICATIONS:  Dyspepsia.   abnormal UGI series results. MEDICATIONS: MAC sedation, administered by CRNA and propofol (Diprivan) 300mg  IV TOPICAL ANESTHETIC: Cetacaine Spray DESCRIPTION OF PROCEDURE: After the risks benefits and alternatives of the procedure were thoroughly explained, informed consent was obtained.  The LB GIF-H180 [id number] endoscope was introduced through the mouth and advanced to the second portion of the duodenum. Retained solids obscured multiple areas in the stomach  The instrument was slowly withdrawn as the mucosa was fully examined.  ESOPHAGUS: Abnormal mucosa was found in the upper third of the esophagus, middle third of the esophagus, and lower third of the esophagus.  The mucosa was nodular throughout the esophagus, possibly from chronic reflux.  Multiple biopsies were performed. The esophagus was otherwise normal. STOMACH: The visualized mucosa of the stomach appeared normal but multiple areas were covered by retained solids. DUODENUM: The duodenal mucosa showed no abnormalities in the bulb and second portion of the duodenum.  Retroflexed views revealed no abnormalities.   The scope was then withdrawn from the patient and the procedure completed.  COMPLICATIONS: There were no complications.  ENDOSCOPIC IMPRESSION: 1.   Esophageal mucosa was nodular; multiple biopsies 2.   Retained gastric solids  RECOMMENDATIONS: 1.  Anti-reflux regimen and gastroparesis diet long term 2.  Await pathology results 3.  Continue PPI daily    eSigned:  Meryl Dare, MD, [facility] 11/28/2012 1:46 PM   MW:[id number] Eloise Harman, MD

## 2012-11-28 NOTE — Patient Instructions (Addendum)

## 2012-11-28 NOTE — Progress Notes (Signed)
Patient did not experience any of the following events: a burn prior to discharge; a fall within the facility; wrong site/side/patient/procedure/implant event; or a hospital transfer or hospital admission upon discharge from the facility. (G8907) Patient did not have preoperative order for IV antibiotic SSI prophylaxis. (G8918)  

## 2012-11-29 ENCOUNTER — Telehealth: Payer: Self-pay | Admitting: *Deleted

## 2012-11-29 NOTE — Telephone Encounter (Signed)
Number identifier, left message, follow-up  

## 2012-12-01 ENCOUNTER — Encounter (INDEPENDENT_AMBULATORY_CARE_PROVIDER_SITE_OTHER): Payer: BC Managed Care – PPO | Admitting: General Surgery

## 2012-12-04 ENCOUNTER — Ambulatory Visit (INDEPENDENT_AMBULATORY_CARE_PROVIDER_SITE_OTHER): Payer: BC Managed Care – PPO | Admitting: General Surgery

## 2012-12-04 ENCOUNTER — Encounter (INDEPENDENT_AMBULATORY_CARE_PROVIDER_SITE_OTHER): Payer: Self-pay | Admitting: General Surgery

## 2012-12-04 ENCOUNTER — Encounter: Payer: Self-pay | Admitting: Gastroenterology

## 2012-12-04 VITALS — BP 108/70 | HR 72 | Temp 97.6°F | Resp 16 | Ht 69.0 in | Wt 206.0 lb

## 2012-12-04 DIAGNOSIS — K420 Umbilical hernia with obstruction, without gangrene: Secondary | ICD-10-CM

## 2012-12-04 DIAGNOSIS — K432 Incisional hernia without obstruction or gangrene: Secondary | ICD-10-CM

## 2012-12-04 NOTE — Progress Notes (Signed)
[name], male   DOB: [birth year], 47 y.o.   MRN: [id number]  Chief Complaint  Patient presents with  . Incisional Hernia    discuss CT results    HPI Paul Griffin is a 47 y.o. male.  He returns to discuss elective repair of his multiple incisional hernias.  Recent GI workup by Dr. Russella Dar reveals reflux esophagitis and gastroparesis. He's had an upper GI and upper endoscopy and biopsies. Nothing other than that was found.   He is on Diflucan and proton pump inhibitors and being followed by Dr. Russella Dar.  He states his hernia still bothered him. They have enlarged over the past 8 months. A CT scan shows a small hernia at the umbilicus with fat in it and a larger hernia in the midline above the umbilicus also containing only fat. No other abnormalities noted on CT scan.  Comorbidities include history of bleeding duodenal ulcer, he states I performed a vagotomy and pyloroplasty 20 years ago. Cervical disc surgery 2010. Mild sleep apnea. Chronic anxiety.  Family history significant for lung cancer, colon cancer and breast cancer. HPI  Past Medical History  Diagnosis Date  . Arthritis   . Anxiety     panic attacks  . Ulcer     gastric 1988  . Low back pain   . Obstructive sleep apnea     miald w aith RDI of 6.4 per hour  . Chronic neck pain   . Peptic ulcer 1994    severe requiring vagotomy and pyloroplasty  . Hoarseness of voice     chronic  . Dyslipidemia   . Erectile dysfunction     organic  . Family history of colon cancer   . Lipoma of arm 1994    right  . Blood transfusion without reported diagnosis   . Constipation   . Diarrhea   . Nasal congestion   . Trouble swallowing   . Palpitations   . Abdominal distension   . Rectal bleeding   . Blood in stool   . Generalized headaches   . Difficulty urinating   . Tubular adenoma of colon 08/2012  . Mitral valve prolapse   . Anemia   . GERD (gastroesophageal reflux disease)   . Depression      Past Surgical History  Procedure Laterality Date  . Stomach surgery  1988    ulcers  . Colonoscopy  07/2007    showed polyps, follow up in 2013  . Tonsillectomy  2008  . Hernia repair  1994    pt denies 11/06/12 sd    Family History  Problem Relation Age of Onset  . Colon cancer Mother   . Lymphoma Mother     non- hodgkins  . COPD Mother   . Melanoma Mother   . Ovarian cancer Sister   . Ulcerative colitis Sister   . Kidney cancer Father   . Heart disease Father   . Cancer Sister     brain  . Breast cancer Sister     Social History History  Substance Use Topics  . Smoking status: Never Smoker   . Smokeless tobacco: Never Used  . Alcohol Use: Yes     Comment: rarely    Allergies  Allergen Reactions  . Morphine And Related Other (See Comments)    hallucinations    Current Outpatient Prescriptions  Medication Sig Dispense Refill  . celecoxib (CELEBREX) 200 MG capsule Take 200 mg by mouth 2 (two) times daily.      [name]  fluconazole (DIFLUCAN) 100 MG tablet Take 1 tablet (100 mg total) by mouth daily.  7 tablet  0  . HYDROcodone-acetaminophen (VICODIN) 5-500 MG per tablet Take 1 tablet by mouth every 6 (six) hours as needed. pain      . LORazepam (ATIVAN) 0.5 MG tablet as needed.      . metaxalone (SKELAXIN) 800 MG tablet Take 800 mg by mouth as needed.      [name] omeprazole (PRILOSEC) 40 MG capsule Take 1 capsule (40 mg total) by mouth daily.  30 capsule  11  . sertraline (ZOLOFT) 100 MG tablet Take 1 tablet by mouth Daily.       No current facility-administered medications for this visit.    Review of Systems Review of Systems  Gastrointestinal: Positive for abdominal pain.  Psychiatric/Behavioral: The patient is nervous/anxious.     Blood pressure 108/70, pulse 72, temperature 97.6 F (36.4 C), temperature source Temporal, resp. rate 16, height 5\' 9"  (1.753 m), weight 206 lb (93.441 kg).  Physical Exam Physical Exam  Constitutional: He is oriented to person,  place, and time. He appears well-developed and well-nourished. No distress.  HENT:  Head: Normocephalic.  Nose: Nose normal.  Mouth/Throat: No oropharyngeal exudate.  Eyes: Conjunctivae and EOM are normal. Pupils are equal, round, and reactive to light. Right eye exhibits no discharge. Left eye exhibits no discharge. No scleral icterus.  Neck: Normal range of motion. Neck supple. No JVD present. No tracheal deviation present. No thyromegaly present.  Cardiovascular: Normal rate, regular rhythm, normal heart sounds and intact distal pulses.   No murmur heard. Pulmonary/Chest: Effort normal and breath sounds normal. No stridor. No respiratory distress. He has no wheezes. He has no rales. He exhibits no tenderness.  Abdominal: Soft. Bowel sounds are normal. He exhibits no distension and no mass. There is no tenderness. There is no rebound and no guarding.  Midline incision from xiphoid to umbilicus. Small to medium-sized reducible hernia midway between xiphoid and umbilicus. This has an obvious bulge. Defect 5 cm or less. Small nonreducible umbilical hernia.  Musculoskeletal: Normal range of motion. He exhibits no edema and no tenderness.  Lymphadenopathy:    He has no cervical adenopathy.  Neurological: He is alert and oriented to person, place, and time. He has normal reflexes. Coordination normal.  Skin: Skin is warm and dry. No rash noted. He is not diaphoretic. No erythema. No pallor.  Psychiatric: He has a normal mood and affect. His behavior is normal. Judgment and thought content normal.  He is very pleasant and appropriate. Somewhat anxious.    Data Reviewed Dr. Ardell Isaacs GI workup. Recent CT scan. Assessment    Incisional hernia upper midline and incarcerated umbilical hernia. By history this is enlarging. He would like to have this repaired, laparoscopically if possible  Gastroparesis, question whether this might be related to his ulcer surgery  Reflux  esophagitis  IBS  Adenomatous colon polyps, follow by Dr. Russella Dar  Chronic anxiety  History cervical disc surgery 2010  History of gastric surgery for bleeding duodenal ulcer by May 1994.  Mild obstructive sleep apnea     Plan    We will schedule for laparoscopic repair of his incisional hernia and incarcerated umbilical hernia with mesh, possible open in the near future. [facility]  I discussed the indications, details, techniques, and numerous risks of the surgery with him. He understands all these issues and all his questions transferred. He agrees with this plan.  Angelia Mould. Derrell Lolling, M.D., [facility] General and Minimally invasive Surgery Breast and Colorectal Surgery Office:   [phone] Pager:   [phone]  12/04/2012, 4:06 PM

## 2012-12-04 NOTE — Patient Instructions (Addendum)
You have a larger hernia in your incision  and a smaller hernia at your umbilicus, or belly-button.  We have reviewed all of the GI workup that Dr. Russella Dar has performed, and that seems stable.  You have decided to go ahead and schedule surgery to repair your ventral hernia and your umbilical hernia with mesh. We will most likely be able to do this laparoscopically, but there is always a chance this may have to be converted to an open operation       Hernia Repair with Laparoscope A hernia occurs when an internal organ pushes out through a weak spot in the belly (abdominal) wall muscles. Hernias most commonly occur in the groin and around the navel. Hernias can also occur through a cut by the surgeon (incision) after an abdominal operation. A hernia may be caused by:  Lifting heavy objects.  Prolonged coughing.  Straining to move your bowels. Hernias can often be pushed back into place (reduced). Most hernias tend to get worse over time. Problems occur when abdominal contents get stuck in the opening and the blood supply is blocked or impaired (incarcerated hernia). Because of these risks, you require surgery to repair the hernia. Your hernia will be repaired using a laparoscope. Laparoscopic surgery is a type of minimally invasive surgery. It does not involve making a typical surgical cut (incision) in the skin. A laparoscope is a telescope-like rod and lens system. It is usually connected to a video camera and a light source so your caregiver can clearly see the operative area. The instruments are inserted through  to  inch (5 mm or 10 mm) openings in the skin at specific locations. A working and viewing space is created by blowing a small amount of carbon dioxide gas into the abdominal cavity. The abdomen is essentially blown up like a balloon (insufflated). This elevates the abdominal wall above the internal organs like a dome. The carbon dioxide gas is common to the human body and can be  absorbed by tissue and removed by the respiratory system. Once the repair is completed, the small incisions will be closed with either stitches (sutures) or staples (just like a paper stapler only this staple holds the skin together). LET YOUR CAREGIVERS KNOW ABOUT:  Allergies.  Medications taken including herbs, eye drops, over the counter medications, and creams.  Use of steroids (by mouth or creams).  Previous problems with anesthetics or Novocaine.  Possibility of pregnancy, if this applies.  History of blood clots (thrombophlebitis).  History of bleeding or blood problems.  Previous surgery.  Other health problems. BEFORE THE PROCEDURE  Laparoscopy can be done either in a hospital or out-patient clinic. You may be given a mild sedative to help you relax before the procedure. Once in the operating room, you will be given a general anesthesia to make you sleep (unless you and your caregiver choose a different anesthetic).  AFTER THE PROCEDURE  After the procedure you will be watched in a recovery area. Depending on what type of hernia was repaired, you might be admitted to the hospital or you might go home the same day. With this procedure you may have less pain and scarring. This usually results in a quicker recovery and less risk of infection. HOME CARE INSTRUCTIONS   Bed rest is not required. You may continue your normal activities but avoid heavy lifting (more than 10 pounds) or straining.  Cough gently. If you are a smoker it is best to stop, as even the best  hernia repair can break down with the continual strain of coughing.  Avoid driving until given the OK by your surgeon.  There are no dietary restrictions unless given otherwise.  TAKE ALL MEDICATIONS AS DIRECTED.  Only take over-the-counter or prescription medicines for pain, discomfort, or fever as directed by your caregiver. SEEK MEDICAL CARE IF:   There is increasing abdominal pain or pain in your  incisions.  There is more bleeding from incisions, other than minimal spotting.  You feel light headed or faint.  You develop an unexplained fever, chills, and/or an oral temperature above 102 F (38.9 C).  You have redness, swelling, or increasing pain in the wound.  Pus coming from wound.  A foul smell coming from the wound or dressings. SEEK IMMEDIATE MEDICAL CARE IF:   You develop a rash.  You have difficulty breathing.  You have any allergic problems. MAKE SURE YOU:   Understand these instructions.  Will watch your condition.  Will get help right away if you are not doing well or get worse. Document Released: 08/23/2005 Document Revised: 11/15/2011 Document Reviewed: 07/23/2009 [facility] Patient Information 2013 [place], [place].

## 2012-12-05 ENCOUNTER — Telehealth (INDEPENDENT_AMBULATORY_CARE_PROVIDER_SITE_OTHER): Payer: Self-pay

## 2012-12-05 NOTE — Telephone Encounter (Signed)
I called and scheduled the pt for his postoperative appointment 5/14 at 3:45pm

## 2012-12-07 ENCOUNTER — Encounter (INDEPENDENT_AMBULATORY_CARE_PROVIDER_SITE_OTHER): Payer: Self-pay

## 2012-12-12 ENCOUNTER — Encounter (HOSPITAL_COMMUNITY): Payer: Self-pay | Admitting: Pharmacy Technician

## 2012-12-14 ENCOUNTER — Encounter (INDEPENDENT_AMBULATORY_CARE_PROVIDER_SITE_OTHER): Payer: Self-pay

## 2012-12-18 ENCOUNTER — Encounter (HOSPITAL_COMMUNITY)
Admission: RE | Admit: 2012-12-18 | Discharge: 2012-12-18 | Disposition: A | Discharge status: Home / Self Care | Payer: BC Managed Care – PPO | Source: Ambulatory Visit | Attending: General Surgery | Admitting: General Surgery

## 2012-12-18 ENCOUNTER — Encounter (HOSPITAL_COMMUNITY): Payer: Self-pay

## 2012-12-18 ENCOUNTER — Ambulatory Visit (HOSPITAL_COMMUNITY)
Admission: RE | Admit: 2012-12-18 | Discharge: 2012-12-18 | Disposition: A | Discharge status: Home / Self Care | Payer: BC Managed Care – PPO | Source: Ambulatory Visit | Attending: General Surgery | Admitting: General Surgery

## 2012-12-18 DIAGNOSIS — K432 Incisional hernia without obstruction or gangrene: Secondary | ICD-10-CM | POA: Insufficient documentation

## 2012-12-18 DIAGNOSIS — I059 Rheumatic mitral valve disease, unspecified: Secondary | ICD-10-CM | POA: Insufficient documentation

## 2012-12-18 DIAGNOSIS — R002 Palpitations: Secondary | ICD-10-CM | POA: Insufficient documentation

## 2012-12-18 DIAGNOSIS — Z01812 Encounter for preprocedural laboratory examination: Secondary | ICD-10-CM | POA: Insufficient documentation

## 2012-12-18 DIAGNOSIS — Z01818 Encounter for other preprocedural examination: Secondary | ICD-10-CM | POA: Insufficient documentation

## 2012-12-18 DIAGNOSIS — G4730 Sleep apnea, unspecified: Secondary | ICD-10-CM | POA: Insufficient documentation

## 2012-12-18 LAB — CBC WITH DIFFERENTIAL/PLATELET
Basophils Absolute: 0.1 10*3/uL (ref 0.0–0.1)
Basophils Relative: 1 % (ref 0–1)
Eosinophils Relative: 5 % (ref 0–5)
HCT: 42.4 % (ref 39.0–52.0)
MCHC: 35.1 g/dL (ref 30.0–36.0)
MCV: 87.1 fL (ref 78.0–100.0)
Monocytes Absolute: 0.5 10*3/uL (ref 0.1–1.0)
Monocytes Relative: 7 % (ref 3–12)
RDW: 12.9 % (ref 11.5–15.5)

## 2012-12-18 LAB — SURGICAL PCR SCREEN
MRSA, PCR: NEGATIVE
Staphylococcus aureus: POSITIVE — AB

## 2012-12-18 LAB — COMPREHENSIVE METABOLIC PANEL
AST: 20 U/L (ref 0–37)
Albumin: 4.1 g/dL (ref 3.5–5.2)
BUN: 20 mg/dL (ref 6–23)
CO2: 26 mEq/L (ref 19–32)
Calcium: 9.5 mg/dL (ref 8.4–10.5)
Creatinine, Ser: 0.97 mg/dL (ref 0.50–1.35)
GFR calc non Af Amer: >90 mL/min (ref >90)

## 2012-12-18 LAB — URINALYSIS, ROUTINE W REFLEX MICROSCOPIC
Hgb urine dipstick: NEGATIVE
Leukocytes, UA: NEGATIVE
Nitrite: NEGATIVE
Protein, ur: NEGATIVE mg/dL
Specific Gravity, Urine: 1.024 (ref 1.005–1.030)
Urobilinogen, UA: 1 mg/dL (ref 0.0–1.0)

## 2012-12-18 MED ORDER — CHLORHEXIDINE GLUCONATE 4 % EX LIQD: 1.0000 "application " | Freq: Once | CUTANEOUS | Status: DC

## 2012-12-18 NOTE — Pre-Procedure Instructions (Deleted)
Paul Griffin  12/18/2012   Your procedure is scheduled on:  December 25, 2012  Report to [facility] at 10:00 AM.  Call this number if you have problems the morning of surgery: [phone]   Remember:   Do not eat food or drink liquids after midnight.   Take these medicines the morning of surgery with A SIP OF WATER: omeprazole(prilosec), sertraline(zoloft), hydrocodone-acetaminophen(vicodin) as needed, lorazepam   Do not wear jewelry, make-up or nail polish.  Do not wear lotions, powders, or perfumes. You may wear deodorant.  Do not shave 48 hours prior to surgery. Men may shave face and neck.  Do not bring valuables to the hospital.  Contacts, dentures or bridgework may not be worn into surgery.  Leave suitcase in the car. After surgery it may be brought to your room.  For patients admitted to the hospital, checkout time is 11:00 AM the day of  discharge.   Patients discharged the day of surgery will not be allowed to drive  home.  Name and phone number of your driver: family/friend  Special Instructions: Shower using CHG 2 nights before surgery and the night before surgery.  If you shower the day of surgery use CHG.  Use special wash - you have one bottle of CHG for all showers.  You should use approximately 1/3 of the bottle for each shower.   Please read over the following fact sheets that you were given: Pain Booklet, Coughing and Deep Breathing and Surgical Site Infection Prevention

## 2012-12-20 NOTE — H&P (Signed)
Paul Griffin   MRN:  [id number]   Description: 47 year old male  Provider: Ernestene Mention, MD  Department: [facility]       Diagnoses    Incisional hernia, without obstruction or gangrene    -  Primary    553.21    Umbilical hernia with obstruction        552.1     Current Vitals    BP Pulse Temp(Src) Resp Ht Wt    108/70 72 97.6 F (36.4 C) (Temporal) 16 5\' 9"  (1.753 m) 206 lb (93.441 kg)       BMI - 30.41 kg/m2                 History and Phtysical   Ernestene Mention, MD   Status: Signed                               HPI Paul Griffin is a 47 y.o. male.  He returns to discuss elective repair of his multiple incisional hernias.   Recent GI workup by Dr. Russella Dar reveals reflux esophagitis and gastroparesis. He's had an upper GI and upper endoscopy and biopsies. Nothing other than that was found.   He is on Diflucan and proton pump inhibitors and being followed by Dr. Russella Dar.   He states his hernia still bothered him. They have enlarged over the past 8 months. A CT scan shows a small hernia at the umbilicus with fat in it and a larger hernia in the midline above the umbilicus also containing only fat. No other abnormalities noted on CT scan.   Comorbidities include history of bleeding duodenal ulcer, he states I performed a vagotomy and pyloroplasty 20 years ago. Cervical disc surgery 2010. Mild sleep apnea. Chronic anxiety.   Family history significant for lung cancer, colon cancer and breast cancer.       Past Medical History   Diagnosis  Date   .  Arthritis     .  Anxiety         panic attacks   .  Ulcer         gastric 1988   .  Low back pain     .  Obstructive sleep apnea         miald w aith RDI of 6.4 per hour   .  Chronic neck pain     .  Peptic ulcer  1994       severe requiring vagotomy and pyloroplasty   .  Hoarseness of voice         chronic   .  Dyslipidemia     .  Erectile dysfunction         organic   .   Family history of colon cancer     .  Lipoma of arm  1994       right   .  Blood transfusion without reported diagnosis     .  Constipation     .  Diarrhea     .  Nasal congestion     .  Trouble swallowing     .  Palpitations     .  Abdominal distension     .  Rectal bleeding     .  Blood in stool     .  Generalized headaches     .  Difficulty urinating     .  Tubular adenoma of colon  08/2012   .  Mitral valve prolapse     .  Anemia     .  GERD (gastroesophageal reflux disease)     .  Depression           Past Surgical History   Procedure  Laterality  Date   .  Stomach surgery    1988       ulcers   .  Colonoscopy    07/2007       showed polyps, follow up in 2013   .  Tonsillectomy    2008   .  Hernia repair    1994       pt denies 11/06/12 sd         Family History   Problem  Relation  Age of Onset   .  Colon cancer  Mother     .  Lymphoma  Mother         non- hodgkins   .  COPD  Mother     .  Melanoma  Mother     .  Ovarian cancer  Sister     .  Ulcerative colitis  Sister     .  Kidney cancer  Father     .  Heart disease  Father     .  Cancer  Sister         brain   .  Breast cancer  Sister          Social History History   Substance Use Topics   .  Smoking status:  Never Smoker    .  Smokeless tobacco:  Never Used   .  Alcohol Use:  Yes         Comment: rarely         Allergies   Allergen  Reactions   .  Morphine And Related  Other (See Comments)       hallucinations         Current Outpatient Prescriptions   Medication  Sig  Dispense  Refill   .  celecoxib (CELEBREX) 200 MG capsule  Take 200 mg by mouth 2 (two) times daily.         .  fluconazole (DIFLUCAN) 100 MG tablet  Take 1 tablet (100 mg total) by mouth daily.   7 tablet   0   .  HYDROcodone-acetaminophen (VICODIN) 5-500 MG per tablet  Take 1 tablet by mouth every 6 (six) hours as needed. pain         .  LORazepam (ATIVAN) 0.5 MG tablet  as needed.         .  metaxalone  (SKELAXIN) 800 MG tablet  Take 800 mg by mouth as needed.         [name]  omeprazole (PRILOSEC) 40 MG capsule  Take 1 capsule (40 mg total) by mouth daily.   30 capsule   11   .  sertraline (ZOLOFT) 100 MG tablet  Take 1 tablet by mouth Daily.             No current facility-administered medications for this visit.        Review of Systems   Gastrointestinal: Positive for abdominal pain.  Psychiatric/Behavioral: The patient is nervous/anxious.       Blood pressure 108/70, pulse 72, temperature 97.6 F (36.4 C), temperature source Temporal, resp. rate 16, height 5\' 9"  (1.753 m), weight 206 lb (93.441 kg).   Physical Exam  Constitutional: He is oriented to person, place, and time. He appears well-developed and well-nourished. No distress.  HENT:   Head: Normocephalic.   Nose: Nose normal.   Mouth/Throat: No oropharyngeal exudate.  Eyes: Conjunctivae and EOM are normal. Pupils are equal, round, and reactive to light. Right eye exhibits no discharge. Left eye exhibits no discharge. No scleral icterus.  Neck: Normal range of motion. Neck supple. No JVD present. No tracheal deviation present. No thyromegaly present.  Cardiovascular: Normal rate, regular rhythm, normal heart sounds and intact distal pulses.    No murmur heard. Pulmonary/Chest: Effort normal and breath sounds normal. No stridor. No respiratory distress. He has no wheezes. He has no rales. He exhibits no tenderness.  Abdominal: Soft. Bowel sounds are normal. He exhibits no distension and no mass. There is no tenderness. There is no rebound and no guarding.  Midline incision from xiphoid to umbilicus. Small to medium-sized reducible hernia midway between xiphoid and umbilicus. This has an obvious bulge. Defect 5 cm or less. Small nonreducible umbilical hernia.  Musculoskeletal: Normal range of motion. He exhibits no edema and no tenderness.  Lymphadenopathy:    He has no cervical adenopathy.  Neurological: He is alert and  oriented to person, place, and time. He has normal reflexes. Coordination normal.  Skin: Skin is warm and dry. No rash noted. He is not diaphoretic. No erythema. No pallor.  Psychiatric: He has a normal mood and affect. His behavior is normal. Judgment and thought content normal.  He is very pleasant and appropriate. Somewhat anxious.      Data Reviewed Dr. Ardell Isaacs GI workup. Recent CT scan.    Assessment    Incisional hernia upper midline and incarcerated umbilical hernia. By history this is enlarging. He would like to have this repaired, laparoscopically if possible   Gastroparesis, question whether this might be related to his ulcer surgery   Reflux esophagitis   IBS   Adenomatous colon polyps, follow by Dr. Russella Dar   Chronic anxiety   History cervical disc surgery 2010   History of gastric surgery for bleeding duodenal ulcer by me 1994.   Mild obstructive sleep apnea       Plan    We will schedule for laparoscopic repair of his incisional hernia and incarcerated umbilical hernia with mesh, possible open in the near future. He requests [facility]   I discussed the indications, details, techniques, and numerous risks of the surgery with him. He understands all these issues and all his question sare answered.  He agrees with this plan.           Angelia Mould. Derrell Lolling, M.D., [facility] General and Minimally invasive Surgery Breast and Colorectal Surgery Office:   [phone] Pager:   [phone]

## 2012-12-24 MED ORDER — CEFAZOLIN SODIUM-DEXTROSE 2-3 GM-% IV SOLR
2.0000 g | INTRAVENOUS | Status: AC
  Administered 2012-12-25: 2 g via INTRAVENOUS
  Filled 2012-12-24: qty 50

## 2012-12-25 ENCOUNTER — Encounter (HOSPITAL_COMMUNITY)
Admission: RE | Disposition: A | Discharge status: Home / Self Care | Payer: Self-pay | Source: Ambulatory Visit | Attending: General Surgery

## 2012-12-25 ENCOUNTER — Inpatient Hospital Stay (HOSPITAL_COMMUNITY)
Admission: RE | Admit: 2012-12-25 | Discharge: 2012-12-29 | DRG: 160 | Disposition: A | Discharge status: Home / Self Care | Payer: BC Managed Care – PPO | Source: Ambulatory Visit | Attending: General Surgery | Admitting: General Surgery

## 2012-12-25 ENCOUNTER — Encounter (INDEPENDENT_AMBULATORY_CARE_PROVIDER_SITE_OTHER): Payer: Self-pay | Admitting: General Surgery

## 2012-12-25 ENCOUNTER — Encounter (HOSPITAL_COMMUNITY): Payer: Self-pay | Admitting: *Deleted

## 2012-12-25 ENCOUNTER — Ambulatory Visit (HOSPITAL_COMMUNITY): Payer: BC Managed Care – PPO | Admitting: Anesthesiology

## 2012-12-25 ENCOUNTER — Encounter (HOSPITAL_COMMUNITY): Payer: Self-pay | Admitting: Anesthesiology

## 2012-12-25 DIAGNOSIS — Z79899 Other long term (current) drug therapy: Secondary | ICD-10-CM

## 2012-12-25 DIAGNOSIS — K589 Irritable bowel syndrome without diarrhea: Secondary | ICD-10-CM | POA: Diagnosis present

## 2012-12-25 DIAGNOSIS — I059 Rheumatic mitral valve disease, unspecified: Secondary | ICD-10-CM | POA: Diagnosis present

## 2012-12-25 DIAGNOSIS — K429 Umbilical hernia without obstruction or gangrene: Secondary | ICD-10-CM | POA: Diagnosis present

## 2012-12-25 DIAGNOSIS — Z860100 Personal history of colon polyps, unspecified: Secondary | ICD-10-CM

## 2012-12-25 DIAGNOSIS — K5900 Constipation, unspecified: Secondary | ICD-10-CM | POA: Diagnosis present

## 2012-12-25 DIAGNOSIS — K3184 Gastroparesis: Secondary | ICD-10-CM | POA: Diagnosis present

## 2012-12-25 DIAGNOSIS — G4733 Obstructive sleep apnea (adult) (pediatric): Secondary | ICD-10-CM | POA: Diagnosis present

## 2012-12-25 DIAGNOSIS — K430 Incisional hernia with obstruction, without gangrene: Principal | ICD-10-CM | POA: Diagnosis present

## 2012-12-25 DIAGNOSIS — K432 Incisional hernia without obstruction or gangrene: Secondary | ICD-10-CM | POA: Diagnosis present

## 2012-12-25 DIAGNOSIS — F3289 Other specified depressive episodes: Secondary | ICD-10-CM | POA: Diagnosis present

## 2012-12-25 DIAGNOSIS — K2100 Gastro-esophageal reflux disease with esophagitis, without bleeding: Secondary | ICD-10-CM | POA: Diagnosis present

## 2012-12-25 DIAGNOSIS — F411 Generalized anxiety disorder: Secondary | ICD-10-CM | POA: Diagnosis present

## 2012-12-25 DIAGNOSIS — F329 Major depressive disorder, single episode, unspecified: Secondary | ICD-10-CM | POA: Diagnosis present

## 2012-12-25 HISTORY — PX: INCISIONAL HERNIA REPAIR: SHX193

## 2012-12-25 HISTORY — PX: INSERTION OF MESH: SHX5868

## 2012-12-25 HISTORY — PX: HERNIA REPAIR: SHX51

## 2012-12-25 LAB — CBC
MCHC: 34.7 g/dL (ref 30.0–36.0)
Platelets: 188 10*3/uL (ref 150–400)
RDW: 12.9 % (ref 11.5–15.5)

## 2012-12-25 LAB — CREATININE, SERUM
Creatinine, Ser: 0.97 mg/dL (ref 0.50–1.35)
GFR calc non Af Amer: >90 mL/min (ref >90)

## 2012-12-25 SURGERY — REPAIR, HERNIA, INCISIONAL, LAPAROSCOPIC
Anesthesia: General | Site: Abdomen | Wound class: Clean

## 2012-12-25 MED ORDER — HYDROMORPHONE HCL PF 1 MG/ML IJ SOLN
0.2500 mg | INTRAMUSCULAR | Status: DC | PRN
  Administered 2012-12-25 (×3): 0.5 mg via INTRAVENOUS

## 2012-12-25 MED ORDER — HYDROMORPHONE HCL PF 1 MG/ML IJ SOLN
INTRAMUSCULAR | Status: AC
  Administered 2012-12-25: 2 mg via INTRAVENOUS
  Filled 2012-12-25: qty 2

## 2012-12-25 MED ORDER — 0.9 % SODIUM CHLORIDE (POUR BTL) OPTIME
TOPICAL | Status: DC | PRN
  Administered 2012-12-25: 1000 mL

## 2012-12-25 MED ORDER — PANTOPRAZOLE SODIUM 40 MG PO TBEC
40.0000 mg | DELAYED_RELEASE_TABLET | Freq: Every day | ORAL | Status: DC
  Administered 2012-12-25: 40 mg via ORAL
  Filled 2012-12-25: qty 1

## 2012-12-25 MED ORDER — BUPIVACAINE-EPINEPHRINE 0.25% -1:200000 IJ SOLN
INTRAMUSCULAR | Status: DC | PRN
  Administered 2012-12-25: 20 mL

## 2012-12-25 MED ORDER — CELECOXIB 200 MG PO CAPS
200.0000 mg | ORAL_CAPSULE | Freq: Two times a day (BID) | ORAL | Status: DC
Start: 2012-12-25 — End: 2012-12-29
  Administered 2012-12-26 – 2012-12-28 (×5): 200 mg via ORAL
  Filled 2012-12-25 (×9): qty 1

## 2012-12-25 MED ORDER — CEFAZOLIN SODIUM-DEXTROSE 2-3 GM-% IV SOLR
2.0000 g | Freq: Three times a day (TID) | INTRAVENOUS | Status: AC
  Administered 2012-12-25 – 2012-12-26 (×3): 2 g via INTRAVENOUS
  Filled 2012-12-25 (×3): qty 50

## 2012-12-25 MED ORDER — FENTANYL CITRATE 0.05 MG/ML IJ SOLN
INTRAMUSCULAR | Status: DC | PRN
  Administered 2012-12-25 (×3): 50 ug via INTRAVENOUS
  Administered 2012-12-25: 100 ug via INTRAVENOUS

## 2012-12-25 MED ORDER — LIDOCAINE HCL (CARDIAC) 20 MG/ML IV SOLN
INTRAVENOUS | Status: DC | PRN
  Administered 2012-12-25: 50 mg via INTRAVENOUS

## 2012-12-25 MED ORDER — KETOROLAC TROMETHAMINE 30 MG/ML IJ SOLN
30.0000 mg | Freq: Once | INTRAMUSCULAR | Status: AC
  Administered 2012-12-25: 30 mg via INTRAVENOUS

## 2012-12-25 MED ORDER — METAXALONE 800 MG PO TABS
800.0000 mg | ORAL_TABLET | Freq: Three times a day (TID) | ORAL | Status: DC | PRN
  Administered 2012-12-26: 800 mg via ORAL
  Filled 2012-12-25: qty 1

## 2012-12-25 MED ORDER — HYDROMORPHONE HCL PF 1 MG/ML IJ SOLN
INTRAMUSCULAR | Status: AC
  Filled 2012-12-25: qty 1

## 2012-12-25 MED ORDER — GLYCOPYRROLATE 0.2 MG/ML IJ SOLN
INTRAMUSCULAR | Status: DC | PRN
  Administered 2012-12-25: 0.6 mg via INTRAVENOUS

## 2012-12-25 MED ORDER — ONDANSETRON HCL 4 MG PO TABS: 4.0000 mg | ORAL_TABLET | Freq: Four times a day (QID) | ORAL | Status: DC | PRN

## 2012-12-25 MED ORDER — ONDANSETRON HCL 4 MG/2ML IJ SOLN
INTRAMUSCULAR | Status: DC | PRN
  Administered 2012-12-25: 4 mg via INTRAVENOUS

## 2012-12-25 MED ORDER — PROPOFOL 10 MG/ML IV BOLUS
INTRAVENOUS | Status: DC | PRN
  Administered 2012-12-25: 200 mg via INTRAVENOUS

## 2012-12-25 MED ORDER — LORAZEPAM 0.5 MG PO TABS: 0.5000 mg | ORAL_TABLET | Freq: Every day | ORAL | Status: DC | PRN

## 2012-12-25 MED ORDER — OXYCODONE-ACETAMINOPHEN 5-325 MG PO TABS
1.0000 | ORAL_TABLET | ORAL | Status: DC | PRN
  Administered 2012-12-28: 1 via ORAL
  Filled 2012-12-25: qty 1

## 2012-12-25 MED ORDER — HYDROMORPHONE HCL PF 1 MG/ML IJ SOLN
1.0000 mg | INTRAMUSCULAR | Status: DC | PRN
  Administered 2012-12-26 (×3): 2 mg via INTRAVENOUS
  Filled 2012-12-25 (×3): qty 2

## 2012-12-25 MED ORDER — ARTIFICIAL TEARS OP OINT
TOPICAL_OINTMENT | OPHTHALMIC | Status: DC | PRN
  Administered 2012-12-25: 1 via OPHTHALMIC

## 2012-12-25 MED ORDER — WHITE PETROLATUM GEL
Status: AC
  Filled 2012-12-25: qty 5

## 2012-12-25 MED ORDER — SERTRALINE HCL 100 MG PO TABS
100.0000 mg | ORAL_TABLET | Freq: Every day | ORAL | Status: DC
  Administered 2012-12-26 – 2012-12-28 (×3): 100 mg via ORAL
  Filled 2012-12-25 (×5): qty 1

## 2012-12-25 MED ORDER — POTASSIUM CHLORIDE IN NACL 20-0.9 MEQ/L-% IV SOLN
INTRAVENOUS | Status: DC
  Administered 2012-12-26 – 2012-12-28 (×7): via INTRAVENOUS
  Filled 2012-12-25 (×10): qty 1000

## 2012-12-25 MED ORDER — PROMETHAZINE HCL 25 MG/ML IJ SOLN: 6.2500 mg | INTRAMUSCULAR | Status: DC | PRN

## 2012-12-25 MED ORDER — ROCURONIUM BROMIDE 100 MG/10ML IV SOLN
INTRAVENOUS | Status: DC | PRN
  Administered 2012-12-25: 50 mg via INTRAVENOUS
  Administered 2012-12-25: 20 mg via INTRAVENOUS

## 2012-12-25 MED ORDER — MIDAZOLAM HCL 5 MG/5ML IJ SOLN
INTRAMUSCULAR | Status: DC | PRN
  Administered 2012-12-25: 2 mg via INTRAVENOUS

## 2012-12-25 MED ORDER — NEOSTIGMINE METHYLSULFATE 1 MG/ML IJ SOLN
INTRAMUSCULAR | Status: DC | PRN
  Administered 2012-12-25: 4 mg via INTRAVENOUS

## 2012-12-25 MED ORDER — HEPARIN SODIUM (PORCINE) 5000 UNIT/ML IJ SOLN
5000.0000 [IU] | Freq: Three times a day (TID) | INTRAMUSCULAR | Status: DC
  Administered 2012-12-26 – 2012-12-29 (×10): 5000 [IU] via SUBCUTANEOUS
  Filled 2012-12-25 (×15): qty 1

## 2012-12-25 MED ORDER — BUPIVACAINE-EPINEPHRINE 0.25% -1:200000 IJ SOLN
INTRAMUSCULAR | Status: AC
  Filled 2012-12-25: qty 1

## 2012-12-25 MED ORDER — PROMETHAZINE HCL 25 MG/ML IJ SOLN
12.5000 mg | INTRAMUSCULAR | Status: DC | PRN
  Administered 2012-12-25 – 2012-12-26 (×3): 12.5 mg via INTRAVENOUS
  Filled 2012-12-25 (×4): qty 1

## 2012-12-25 MED ORDER — LACTATED RINGERS IV SOLN
INTRAVENOUS | Status: DC | PRN
  Administered 2012-12-25 (×2): via INTRAVENOUS

## 2012-12-25 MED ORDER — ONDANSETRON HCL 4 MG/2ML IJ SOLN
4.0000 mg | Freq: Four times a day (QID) | INTRAMUSCULAR | Status: DC | PRN
  Administered 2012-12-25 – 2012-12-26 (×3): 4 mg via INTRAVENOUS
  Filled 2012-12-25 (×3): qty 2

## 2012-12-25 SURGICAL SUPPLY — 53 items
ADH SKN CLS APL DERMABOND .7 (GAUZE/BANDAGES/DRESSINGS) ×1
APPLIER CLIP LOGIC TI 5 (MISCELLANEOUS) IMPLANT
APR CLP MED LRG 33X5 (MISCELLANEOUS)
BINDER ABD UNIV 12 45-62 (WOUND CARE) IMPLANT
BINDER ABDOMINAL 46IN 62IN (WOUND CARE) ×2
BLADE SURG ROTATE 9660 (MISCELLANEOUS) ×1 IMPLANT
CANISTER SUCTION 2500CC (MISCELLANEOUS) IMPLANT
CHLORAPREP W/TINT 26ML (MISCELLANEOUS) ×2 IMPLANT
CLOTH BEACON ORANGE TIMEOUT ST (SAFETY) ×2 IMPLANT
COVER SURGICAL LIGHT HANDLE (MISCELLANEOUS) ×2 IMPLANT
DECANTER SPIKE VIAL GLASS SM (MISCELLANEOUS) ×2 IMPLANT
DERMABOND ADVANCED (GAUZE/BANDAGES/DRESSINGS) ×1
DERMABOND ADVANCED .7 DNX12 (GAUZE/BANDAGES/DRESSINGS) IMPLANT
DEVICE SECURE STRAP 25 ABSORB (INSTRUMENTS) ×5 IMPLANT
DEVICE TROCAR PUNCTURE CLOSURE (ENDOMECHANICALS) ×2 IMPLANT
DRAPE UTILITY 15X26 W/TAPE STR (DRAPE) ×4 IMPLANT
DRSG PAD ABDOMINAL 8X10 ST (GAUZE/BANDAGES/DRESSINGS) ×2 IMPLANT
ELECT REM PT RETURN 9FT ADLT (ELECTROSURGICAL) ×2
ELECTRODE REM PT RTRN 9FT ADLT (ELECTROSURGICAL) ×1 IMPLANT
GLOVE BIO SURGEON STRL SZ7.5 (GLOVE) ×2 IMPLANT
GLOVE BIOGEL PI IND STRL 6.5 (GLOVE) IMPLANT
GLOVE BIOGEL PI IND STRL 7.0 (GLOVE) IMPLANT
GLOVE BIOGEL PI IND STRL 7.5 (GLOVE) IMPLANT
GLOVE BIOGEL PI INDICATOR 6.5 (GLOVE) ×2
GLOVE BIOGEL PI INDICATOR 7.0 (GLOVE) ×1
GLOVE BIOGEL PI INDICATOR 7.5 (GLOVE) ×2
GLOVE ECLIPSE 7.5 STRL STRAW (GLOVE) ×1 IMPLANT
GLOVE EUDERMIC 7 POWDERFREE (GLOVE) ×2 IMPLANT
GLOVE SURG SS PI 7.0 STRL IVOR (GLOVE) ×1 IMPLANT
GOWN PREVENTION PLUS XLARGE (GOWN DISPOSABLE) ×2 IMPLANT
GOWN STRL NON-REIN LRG LVL3 (GOWN DISPOSABLE) ×5 IMPLANT
KIT BASIN OR (CUSTOM PROCEDURE TRAY) ×2 IMPLANT
KIT ROOM TURNOVER OR (KITS) ×2 IMPLANT
MARKER SKIN DUAL TIP RULER LAB (MISCELLANEOUS) ×2 IMPLANT
MESH PARIETEX 25X20 (Mesh General) ×1 IMPLANT
NDL SPNL 22GX3.5 QUINCKE BK (NEEDLE) ×1 IMPLANT
NEEDLE SPNL 22GX3.5 QUINCKE BK (NEEDLE) ×2 IMPLANT
NS IRRIG 1000ML POUR BTL (IV SOLUTION) ×2 IMPLANT
PAD ARMBOARD 7.5X6 YLW CONV (MISCELLANEOUS) ×4 IMPLANT
SCALPEL HARMONIC ACE (MISCELLANEOUS) ×1 IMPLANT
SCISSORS LAP 5X35 DISP (ENDOMECHANICALS) ×1 IMPLANT
SET IRRIG TUBING LAPAROSCOPIC (IRRIGATION / IRRIGATOR) IMPLANT
SLEEVE ENDOPATH XCEL 5M (ENDOMECHANICALS) ×6 IMPLANT
SUT MON AB 4-0 PC3 18 (SUTURE) ×3 IMPLANT
SUT NOVA NAB DX-16 0-1 5-0 T12 (SUTURE) ×4 IMPLANT
SUT NOVA NAB GS-21 0 18 T12 DT (SUTURE) ×3 IMPLANT
TOWEL OR 17X24 6PK STRL BLUE (TOWEL DISPOSABLE) ×2 IMPLANT
TOWEL OR 17X26 10 PK STRL BLUE (TOWEL DISPOSABLE) ×2 IMPLANT
TOWEL OR NON WOVEN STRL DISP B (DISPOSABLE) ×1 IMPLANT
TRAY FOLEY CATH 14FR (SET/KITS/TRAYS/PACK) ×1 IMPLANT
TRAY LAPAROSCOPIC (CUSTOM PROCEDURE TRAY) ×2 IMPLANT
TROCAR XCEL NON-BLD 11X100MML (ENDOMECHANICALS) ×2 IMPLANT
TROCAR XCEL NON-BLD 5MMX100MML (ENDOMECHANICALS) ×2 IMPLANT

## 2012-12-25 NOTE — Anesthesia Postprocedure Evaluation (Signed)
  Anesthesia Post-op Note  Patient: Paul Griffin  Procedure(s) Performed: Procedure(s): LAPAROSCOPIC REPAIR MULITPLE INCISIONAL HERNIA WITH MESH (N/A) INSERTION OF MESH (N/A)  Patient Location: PACU  Anesthesia Type:General  Level of Consciousness: awake, oriented and patient cooperative  Airway and Oxygen Therapy: Patient Spontanous Breathing  Post-op Pain: mild  Post-op Assessment: Post-op Vital signs reviewed  Post-op Vital Signs: stable  Complications: No apparent anesthesia complications

## 2012-12-25 NOTE — Op Note (Signed)
Patient Name:           Paul Griffin   Date of Surgery:        12/25/2012  Pre op Diagnosis:      Incarcerated ventral incisional hernia, multiple  Post op Diagnosis:    Same  Procedure:                 Laparoscopic lysis of adhesions, reduction of incarcerated omentum, laparoscopic repair multiple incarcerated incisional hernias with 20 cm x 25 cm Parietex PCO composite mesh  Surgeon:                     Angelia Mould. Derrell Lolling, M.D., FACS  Assistant:                      None  Operative Indications:   Paul Griffin is a 47 y.o. male. He is referred by Dr. Jarome Matin for evaluation of an incisional hernia.  This patient underwent surgery by me 20 years ago for recurrent duodenal ulcer. He states that I did a vagotomy and pyloroplasty. I do not have those records. He is followed by Dr. Claudette Head for irritable bowel syndrome and gets colonoscopies periodically. Last colonoscopy was 09/05/2012 with findings of a villous adenoma without dysplasia. [name]  He has noted a bulge in his upper midline in his incision for about 1 year. He says it has doubled in size over the past 8 months, and he pointed it out to Dr. Jarold Motto. Dr. Jarold Motto referred him for my opinion. Examination reveals a ventral hernia in the upper midline in his incision and also a hernia around the umbilicus which is not reducible. He has had a CT scan which confirms multiple hernias. He also has symptoms of postprandial food trapping in the stomach, thinks his stomach doesn't empty, only slowly passes food out. Denies nausea or vomiting. He does have irritable bowel syndrome with alternating diarrhea and constipation and intermittent mid and lower abdominal cramps. He has had recent endoscopy and management of his esophagitis and gastroparesis by Dr. Russella Dar.   Comorbidities include a history of bleeding duodenal ulcer 1994, hemorrhoids, cervical disc surgery 2010, mild obstructive sleep apnea, erectile dysfunction, and  chronic anxiety.    Operative Findings:       The patient had a hernia at his umbilicus, a larger hernia in the midline above the umbilicus containing incarcerated omentum, and a small hernia in the midline above that containing incarcerated omentum. There were extensive omental adhesions from his previous gastric surgery. Each of these hernia defects was fairly small, but they were multiple.  Procedure in Detail:          Following the induction of general endotracheal anesthesia a Foley catheter was inserted and the abdomen was prepped and draped in a sterile fashion. Intravenous antibiotics were given. Surgical time out was performed. 0.5% Marcaine with epinephrine was used as local infiltration anesthetic. A 5 mm optical trocar was placed in the left subcostal region. Entry  was uneventful. Pneumoperitoneum was created with visualization as described above. On the left side I ultimately placed a total of two 5 mm trocars and one 11 mm trocar. On the right side ultimately placed two 5 mm trocars. We assessed the anatomy of the adhesions and incarcerated omentum. The small bowel did not appear to be involved. The gastric antrum was somewhat close but was easily dissected away. We took all the adhesions down sharply and with the  harmonic scalpel to completely reduce it. . I used a 25 cm vertically by 20 cm transversely piece of parietex  PCO composite mesh. The pneumoperitoneum was released and I marked a template on the abdominal wall and marked the mesh. I placed 8 equidistant sutures of #1 Novofil in the perimeter of the mesh. I moistened the mesh, rolled it up and inserted it into the peritoneal cavity and the pneumoperitoneum recreated. I positined the mesh.  . I was very careful to place the  nonadherent surface of the mesh toward the viscera and the rough side of the mesh towards the abdominal wall. I made a small stab wound in the abdominal wall skin at the 8 equidistant suture fixation  sites I drew  the sutures up through these puncture wounds, being careful to take about a 1 cm bite of fascia. After all the sutures were placed and pulled up I then tied them and   the mesh deployed nicely and without redundancy  . I then  further secured the mesh with the Secure Strap   device in a double crown technique. I fired the  device 80 or 85 times and  the coverage and security looked good. There did not appear to be any gaps anywhere in the closure. There was no bleeding. I inspected the stomach and small bowel, colon and omentum and everything looked good. Trocars were removed. The pneumoperitoneum was released. The skin incisions were closed with subcuticular sutures of 4-0 Monocryl and Dermabond.  A velcro binder was placed. The patient taken to recovery in stable condition. EBL 25 cc. Counts correct. Complications none.     Angelia Mould. Derrell Lolling, M.D., FACS General and Minimally Invasive Surgery Breast and Colorectal Surgery  12/25/2012 1:11 PM

## 2012-12-25 NOTE — Anesthesia Preprocedure Evaluation (Addendum)
Anesthesia Evaluation  Patient identified by MRN, date of birth, ID band Patient awake    Reviewed: Allergy & Precautions, H&P , NPO status , Patient's Chart, lab work & pertinent test results  Airway Mallampati: II TM Distance: >3 FB Neck ROM: Limited    Dental  (+) Dental Advisory Given,    Pulmonary sleep apnea ,  breath sounds clear to auscultation        Cardiovascular + Valvular Problems/Murmurs MVP Rhythm:Regular Rate:Normal     Neuro/Psych  Headaches, Anxiety Depression    GI/Hepatic PUD, GERD-  ,  Endo/Other    Renal/GU      Musculoskeletal   Abdominal   Peds  Hematology   Anesthesia Other Findings   Reproductive/Obstetrics                          Anesthesia Physical Anesthesia Plan  ASA: II  Anesthesia Plan: General   Post-op Pain Management:    Induction: Intravenous  Airway Management Planned: Oral ETT  Additional Equipment:   Intra-op Plan:   Post-operative Plan: Extubation in OR  Informed Consent: I have reviewed the patients History and Physical, chart, labs and discussed the procedure including the risks, benefits and alternatives for the proposed anesthesia with the patient or authorized representative who has indicated his/her understanding and acceptance.     Plan Discussed with: CRNA and Surgeon  Anesthesia Plan Comments:         Anesthesia Quick Evaluation

## 2012-12-25 NOTE — Transfer of Care (Signed)
Immediate Anesthesia Transfer of Care Note  Patient: Paul Griffin  Procedure(s) Performed: Procedure(s): LAPAROSCOPIC REPAIR MULITPLE INCISIONAL HERNIA WITH MESH (N/A) INSERTION OF MESH (N/A)  Patient Location: PACU  Anesthesia Type:General  Level of Consciousness: awake  Airway & Oxygen Therapy: Patient Spontanous Breathing and Patient connected to nasal cannula oxygen  Post-op Assessment: Report given to PACU RN, Post -op Vital signs reviewed and stable and Patient moving all extremities  Post vital signs: Reviewed and stable  Complications: No apparent anesthesia complications

## 2012-12-25 NOTE — Progress Notes (Signed)
Patient nauseated and vomiting x2. No relief with Zofran. MD on call, Tseui paged and orders given for Phenergan 12.5 q4h. Patient aware and Phenergan given. Will continue to monitor.

## 2012-12-26 ENCOUNTER — Observation Stay (HOSPITAL_COMMUNITY): Payer: BC Managed Care – PPO

## 2012-12-26 ENCOUNTER — Encounter (HOSPITAL_COMMUNITY): Payer: Self-pay | Admitting: General Surgery

## 2012-12-26 LAB — BASIC METABOLIC PANEL
CO2: 27 mEq/L (ref 19–32)
Chloride: 102 mEq/L (ref 96–112)
Creatinine, Ser: 0.92 mg/dL (ref 0.50–1.35)
GFR calc Af Amer: >90 mL/min (ref >90)
Sodium: 136 mEq/L (ref 135–145)

## 2012-12-26 LAB — CBC
MCV: 88.5 fL (ref 78.0–100.0)
Platelets: 196 10*3/uL (ref 150–400)
RBC: 4.18 MIL/uL — ABNORMAL LOW (ref 4.22–5.81)
RDW: 13 % (ref 11.5–15.5)
WBC: 9.7 10*3/uL (ref 4.0–10.5)

## 2012-12-26 MED ORDER — PANTOPRAZOLE SODIUM 40 MG IV SOLR
40.0000 mg | Freq: Two times a day (BID) | INTRAVENOUS | Status: DC
  Administered 2012-12-26 – 2012-12-27 (×4): 40 mg via INTRAVENOUS
  Filled 2012-12-26 (×7): qty 40

## 2012-12-26 MED ORDER — METOCLOPRAMIDE HCL 5 MG/ML IJ SOLN
10.0000 mg | Freq: Four times a day (QID) | INTRAMUSCULAR | Status: DC
  Administered 2012-12-26 – 2012-12-28 (×9): 10 mg via INTRAVENOUS
  Filled 2012-12-26 (×15): qty 2

## 2012-12-26 NOTE — Progress Notes (Signed)
Pt. cathed for 525 ml. Clear dark yellow urine.  Pt. Continues with inability to void.  States has no feeling to void.

## 2012-12-26 NOTE — Progress Notes (Signed)
Pt voiding small amounts at a time. At 2130 pt feeling discomfort in bladder. Pt wife I&O cath pt for 775 ml.

## 2012-12-26 NOTE — Progress Notes (Signed)
1 Day Post-Op  Subjective: Problems with repeated nausea and low volume emesis last might. Also trouble voiding, had the catheter once or twice. Pain seems would reasonably well-controlled with intermittent Dilaudid. Denies respiratory difficulty or shoulder pain.  Remote history vagotomy and pyloroplasty. 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 He was evaluated by Dr. Russella Dar.  Afebrile. BP 106/70. Heart rate 89 and regular. SpO2 92% on room air.  Objective: Vital signs in last 24 hours: Temp:  [97.1 F (36.2 C)-98.3 F (36.8 C)] 97.7 F (36.5 C) (04/22 0504) Pulse Rate:  [61-94] 89 (04/22 0504) Resp:  [11-20] 20 (04/22 0504) BP: (100-120)/(60-77) 106/70 mmHg (04/22 0504) SpO2:  [92 %-100 %] 92 % (04/22 0504) Weight:  [205 lb 14.6 oz (93.4 kg)] 205 lb 14.6 oz (93.4 kg) (04/21 1900) Last BM Date: 12/24/12  Intake/Output from previous day: 04/21 0701 - 04/22 0700 In: 2396.7 [I.V.:2396.7] Out: 579 [Urine:575; Emesis/NG output:4] Intake/Output this shift: Total I/O In: 896.7 [I.V.:896.7] Out: 579 [Urine:575; Emesis/NG output:4]    Exam: General appearance: sitting up in bed. Alert and cooperative. Mild distress from nausea and vomiting. Mental status normal. Wife in room with him. Does not look toxic or acutely ill. Resp: clear to auscultation bilaterally GI: abdomen is soft. Appropriately tender. Bowel sounds rare. Not distended. Not tympanitic.Wounds looked fine. No clinical evidence of peritonitis or acute abdomen.    Lab Results:  Results for orders placed during the hospital encounter of 12/25/12 (from the past 24 hour(s))  CBC     Status: Abnormal   Collection Time    12/25/12  3:30 PM      Result Value Range   WBC 14.8 (*) 4.0 - 10.5 K/uL   RBC 4.21 (*) 4.22 - 5.81 MIL/uL   Hemoglobin 12.8 (*) 13.0 - 17.0 g/dL   HCT 16.1 (*) 09.6 - 04.5 %   MCV 87.6  78.0 - 100.0 fL   MCH 30.4  26.0 - 34.0 pg   MCHC  34.7  30.0 - 36.0 g/dL   RDW 40.9  81.1 - 91.4 %   Platelets 188  150 - 400 K/uL  CREATININE, SERUM     Status: None   Collection Time    12/25/12  3:30 PM      Result Value Range   Creatinine, Ser 0.97  0.50 - 1.35 mg/dL   GFR calc non Af Amer >90  >90 mL/min   GFR calc Af Amer >90  >90 mL/min     Studies/Results: @RISRSLT24 @  . celecoxib  200 mg Oral BID  . heparin subcutaneous  5,000 Units Subcutaneous Q8H  . metoCLOPramide (REGLAN) injection  10 mg Intravenous Q6H  . pantoprazole (PROTONIX) IV  40 mg Intravenous Q12H  . sertraline  100 mg Oral Daily     Assessment/Plan: s/p Procedure(s): LAPAROSCOPIC REPAIR MULITPLE INCISIONAL HERNIA WITH MESH INSERTION OF MESH  POD #1. Laparoscopic lysis of omental adhesions and laparoscopic repair of incisional hernias with mesh. No apparent wound problems.  Nausea and vomiting. This may be multifactorial. He certainly carries a diagnosis of gastroparesis and delayed gastric emptying. Medication and narcotics maybe contributing to this. Anesthesia may have contributed to it. NPO.     Consider NG tube if persists. Not now. Check lab work IV Reglan and IV Protonix. Mobilize out of bed. Hopefully this will resolve spontaneously  Chronic anxiety, moderate to moderately severe. Continue anti-anxiety medications.  Urinary retention. Will monitor. Insert Foley if this does not  resolve.  @PROBHOSP @  LOS: 1 day    Lukus Binion M. Derrell Lolling, M.D., [facility] General and Minimally invasive Surgery Breast and Colorectal Surgery Office:   [phone] Pager:   [phone]  12/26/2012  . .prob

## 2012-12-26 NOTE — Progress Notes (Signed)
Patient with nausea and intermittent vomiting throughout night. Last vomiting episode was just 0600 and it appeared to be dark black specks that resembled coffee grounds. Zofran given early. Patient vomited a total of 4 times last night. Will continue to alternate Zofran and Phenergan as ordered by Dr. Harlon Flor.

## 2012-12-26 NOTE — Progress Notes (Signed)
O2 sats decreased to 88% on room air after administration of pain medication, afterwhich patient was lethargic, but still able to follow commands and was completely oriented.  Oxygen applied via nasal cannula at 2 liters to bring saturation up to 94%.  Will continue to monitor.

## 2012-12-27 MED ORDER — HYDROMORPHONE HCL PF 1 MG/ML IJ SOLN: 0.5000 mg | INTRAMUSCULAR | Status: DC | PRN

## 2012-12-27 NOTE — Progress Notes (Addendum)
2 Days Post-Op  Subjective: Having a better day. Nausea and vomiting has resolved. Voiding better this morning, 300 cc each time. PVR to be checked.  Ambulated in home.He is using very little pain medication now.  Lab work and abdominal x-rays yesterday were essentially normal.SpO2 98% on 2 L. No respiratory difficulties.  Objective: Vital signs in last 24 hours: Temp:  [97.8 F (36.6 C)-99.7 F (37.6 C)] 97.8 F (36.6 C) (04/23 0554) Pulse Rate:  [85-108] 85 (04/23 0554) Resp:  [17-22] 17 (04/23 0554) BP: (97-116)/(60-70) 97/60 mmHg (04/23 0554) SpO2:  [84 %-100 %] 98 % (04/23 0554) Last BM Date: 12/24/12  Intake/Output from previous day: 04/22 0701 - 04/23 0700 In: 2072.5 [I.V.:2072.5] Out: 3610 [Urine:3610] Intake/Output this shift: Total I/O In: 491.7 [I.V.:491.7] Out: 2325 [Urine:2325]    General appearance: alert. Cooperative. No distress. Wife in room Resp: lungs are clear bilaterally. No rhonchi or wheeze. GI: abdomen soft. Flat. Nondistended. Hypoactive bowel sounds. Minimally tender. Wounds looked fine.  Lab Results:  Results for orders placed during the hospital encounter of 12/25/12 (from the past 24 hour(s))  CBC     Status: Abnormal   Collection Time    12/26/12  6:45 AM      Result Value Range   WBC 9.7  4.0 - 10.5 K/uL   RBC 4.18 (*) 4.22 - 5.81 MIL/uL   Hemoglobin 12.7 (*) 13.0 - 17.0 g/dL   HCT 40.9 (*) 81.1 - 91.4 %   MCV 88.5  78.0 - 100.0 fL   MCH 30.4  26.0 - 34.0 pg   MCHC 34.3  30.0 - 36.0 g/dL   RDW 78.2  95.6 - 21.3 %   Platelets 196  150 - 400 K/uL  BASIC METABOLIC PANEL     Status: Abnormal   Collection Time    12/26/12  6:45 AM      Result Value Range   Sodium 136  135 - 145 mEq/L   Potassium 4.2  3.5 - 5.1 mEq/L   Chloride 102  96 - 112 mEq/L   CO2 27  19 - 32 mEq/L   Glucose, Bld 127 (*) 70 - 99 mg/dL   BUN 18  6 - 23 mg/dL   Creatinine, Ser 0.86  0.50 - 1.35 mg/dL   Calcium 8.6  8.4 - 57.8 mg/dL   GFR calc non Af Amer >90   >90 mL/min   GFR calc Af Amer >90  >90 mL/min     Studies/Results: @RISRSLT24 @  . celecoxib  200 mg Oral BID  . heparin subcutaneous  5,000 Units Subcutaneous Q8H  . metoCLOPramide (REGLAN) injection  10 mg Intravenous Q6H  . pantoprazole (PROTONIX) IV  40 mg Intravenous Q12H  . sertraline  100 mg Oral Daily     Assessment/Plan: s/p Procedure(s): LAPAROSCOPIC REPAIR MULITPLE INCISIONAL HERNIA WITH MESH INSERTION OF MESH  POD #2. Laparoscopic lysis of omental adhesions and laparoscopic repair of incisional hernias with mesh.  No apparent wound problems.   Nausea and vomiting. This appears to be resolving. I suspect this is temporary ileus secondary to surgery and narcotic superimposed on chronic gastroparesis. Will continue IV Reglan. Start full liquid diet.Decrease IV rate. Decreased narcotic dose.  Chronic anxiety, moderate to moderately severe. Continue anti-anxiety medications.   Urinary retention. Seems to be improving, but will need to check PVR this morning.   @PROBHOSP @  LOS: 2 days    .Angelia Mould. Derrell Lolling, M.D., [facility] General and Minimally invasive Surgery Breast  and Colorectal Surgery Office:   [phone] Pager:   [phone]  12/27/2012  . .prob

## 2012-12-28 MED ORDER — HYDROCODONE-ACETAMINOPHEN 10-325 MG PO TABS
1.0000 | ORAL_TABLET | ORAL | Status: DC | PRN
  Administered 2012-12-28 – 2012-12-29 (×2): 1 via ORAL
  Filled 2012-12-28 (×2): qty 1

## 2012-12-28 MED ORDER — POLYETHYLENE GLYCOL 3350 17 G PO PACK
17.0000 g | PACK | Freq: Two times a day (BID) | ORAL | Status: AC
  Administered 2012-12-28 (×2): 17 g via ORAL
  Filled 2012-12-28: qty 1

## 2012-12-28 MED ORDER — PANTOPRAZOLE SODIUM 40 MG PO TBEC
40.0000 mg | DELAYED_RELEASE_TABLET | Freq: Two times a day (BID) | ORAL | Status: DC
  Administered 2012-12-28 (×2): 40 mg via ORAL
  Filled 2012-12-28 (×2): qty 1

## 2012-12-28 MED ORDER — METOCLOPRAMIDE HCL 5 MG PO TABS
5.0000 mg | ORAL_TABLET | Freq: Three times a day (TID) | ORAL | Status: DC
  Administered 2012-12-28 (×4): 5 mg via ORAL
  Filled 2012-12-28 (×8): qty 1

## 2012-12-28 NOTE — Progress Notes (Signed)
UR completed. Addis Bennie RNBSN 

## 2012-12-28 NOTE — Progress Notes (Addendum)
3 Days Post-Op  Subjective: Satisfactory progress. Pain well-controlled. No more nausea and vomiting. Tolerating full liquids. Passing flatus but no stool. Voiding well. Ambulating in hall.  Objective: Vital signs in last 24 hours: Temp:  [97.9 F (36.6 C)-98.2 F (36.8 C)] 98.2 F (36.8 C) (04/23 2234) Pulse Rate:  [75-83] 75 (04/23 2234) Resp:  [18] 18 (04/23 2234) BP: (98-109)/(61-64) 98/61 mmHg (04/23 2234) SpO2:  [95 %-98 %] 98 % (04/23 2234) Last BM Date: 12/24/12  Intake/Output from previous day: 04/23 0701 - 04/24 0700 In: 1420 [P.O.:820; I.V.:600] Out: 3024 [Urine:3024] Intake/Output this shift: Total I/O In: 960 [P.O.:360; I.V.:600] Out: 775 [Urine:775]  General appearance: alert. Mental status normal. No distress. Wife in room. GI: Abdomen soft, flat, nondistended, minimally tender, hypoactive bowel sounds, wounds clean, benign postop exam.  Lab Results:  No results found for this or any previous visit (from the past 24 hour(s)).   Studies/Results: @RISRSLT24 @  . celecoxib  200 mg Oral BID  . heparin subcutaneous  5,000 Units Subcutaneous Q8H  . metoCLOPramide  5 mg Oral TID AC & HS  . pantoprazole  40 mg Oral BID  . polyethylene glycol  17 g Oral BID  . sertraline  100 mg Oral Daily     Assessment/Plan: s/p Procedure(s): LAPAROSCOPIC REPAIR MULITPLE INCISIONAL HERNIA WITH MESH INSERTION OF MESH  POD #3. Laparoscopic lysis of omental adhesions and laparoscopic repair of incisional hernias with mesh.  Satisfactory progress. Advanced to solid diet Switch Reglan and Protonix to tablet form [facility] at [facility] for constipation, chronic Possible discharge tomorrow  Nausea and vomiting. This has resolved.. I suspect this was temporary ileus secondary to surgery and narcotic superimposed on chronic gastroparesis. Switch to by mouth Reglan. Continue proton pump inhibitors in tablet form.  Chronic anxiety, moderate to moderately severe. Continue anti-anxiety  medications.   Urinary retention. Resolved   @PROBHOSP @  LOS: 3 days    Currie Dennin M. Derrell Lolling, M.D., [facility] General and Minimally invasive Surgery Breast and Colorectal Surgery Office:   [phone] Pager:   [phone]  12/28/2012  . .prob

## 2012-12-29 MED ORDER — HYDROCODONE-ACETAMINOPHEN 5-325 MG PO TABS: 1.0000 | ORAL_TABLET | ORAL | Status: DC | PRN

## 2012-12-29 MED ORDER — POLYETHYLENE GLYCOL 3350 17 G PO PACK: 17.0000 g | PACK | Freq: Once | ORAL | Status: DC

## 2012-12-29 NOTE — Progress Notes (Signed)
Pt discharged to home

## 2012-12-29 NOTE — Progress Notes (Signed)
4 Days Post-Op  Subjective: Patient is doing well. Ambulating independently. Voiding without difficulty. Passing flatus. No stool despite MiraLAX. States he feels well. No abdominal pain. Tolerating regular diet. Requesting to go home.  Objective: Vital signs in last 24 hours: Temp:  [98 F (36.7 C)-98.1 F (36.7 C)] 98.1 F (36.7 C) (04/25 0510) Pulse Rate:  [78-99] 88 (04/25 0510) Resp:  [16-18] 17 (04/25 0510) BP: (107-113)/(64-75) 113/74 mmHg (04/25 0510) SpO2:  [94 %-97 %] 95 % (04/25 0510) Last BM Date: 12/24/12  Intake/Output from previous day: 04/24 0701 - 04/25 0700 In: 1813.8 [P.O.:880; I.V.:933.8] Out: 1575 [Urine:1575] Intake/Output this shift: Total I/O In: 160 [I.V.:160] Out: 400 [Urine:400]  General appearance: alert. Cooperative. Mental status normal. In no distress. GI: abdomen soft. Active bowel sounds. Nondistended. Wounds all looked fine.  Lab Results:  No results found for this or any previous visit (from the past 24 hour(s)).   Studies/Results: @RISRSLT24 @  . celecoxib  200 mg Oral BID  . heparin subcutaneous  5,000 Units Subcutaneous Q8H  . metoCLOPramide  5 mg Oral TID AC & HS  . pantoprazole  40 mg Oral BID  . sertraline  100 mg Oral Daily     Assessment/Plan: s/p Procedure(s): LAPAROSCOPIC REPAIR MULITPLE INCISIONAL HERNIA WITH MESH INSERTION OF MESH  POD #4. Laparoscopic lysis of omental adhesions and laparoscopic repair of incisional hernias with mesh.  Very good progress. Meets discharge criteria. Discharge home today Diet and activities discussed Prescription for Vicodin given Discontinue Reglan Chronic constipation. Advise MiraLAX 2-4 times a day while taking opioid analgesics. Stressed low-fat diet and hydration.    Chronic anxiety, moderate to moderately severe. Continue anti-anxiety medications.   Urinary retention. Resolved   @PROBHOSP @  LOS: 4 days    Cong Hightower M. Derrell Lolling, M.D., [facility] General and Minimally invasive Surgery Breast and Colorectal Surgery Office:   [phone] Pager:   [phone]  12/29/2012  . .prob

## 2012-12-29 NOTE — Discharge Summary (Signed)
[name] [id number] 47 y.o. 05-Dec-1965  Admit date: 12/25/2012  Discharge date and time: 12/29/2012  Admitting Physician: Ernestene Mention  Discharge Physician: Ernestene Mention  Admission Diagnoses: multiple incisional hernias  Discharge Diagnoses: Multiple incisional hernias                                         Chronic anxiety                                         Chronic constipation                                          Gastroparesis, chronic                                          Urinary retention, resolved                                          History of vagotomy and pyloroplasty                                          Reflux esophagitis                                             Operations: Procedure(s): LAPAROSCOPIC REPAIR MULITPLE INCISIONAL HERNIA WITH MESH INSERTION OF MESH  Admission Condition: good  Discharged Condition: good  Indication for Admission:Paul Griffin is a 47 y.o. male. He is referred by Dr. Jarome Matin for evaluation of an incisional hernia.  This patient underwent surgery by me 20 years ago for recurrent duodenal ulcer. He states that I did a vagotomy and pyloroplasty. I do not have those records. He is followed by Dr. Claudette Head for irritable bowel syndrome and gets colonoscopies periodically. Last colonoscopy was 09/05/2012 with findings of a villous adenoma without dysplasia. [name]  He has noted a bulge in his upper midline in his incision for about 1 year. He says it has doubled in size over the past 8 months, and he pointed it out to Dr. Jarold Motto. Dr. Jarold Motto referred him for my opinion. Examination reveals a ventral hernia in the upper midline in his incision and also a hernia around the umbilicus which is not reducible. He has had a CT scan which confirms multiple hernias.  He also has symptoms of postprandial food trapping in the stomach, thinks his stomach doesn't empty, only slowly passes food  out. Denies nausea or vomiting. He does have irritable bowel syndrome with alternating diarrhea and constipation and intermittent mid and lower abdominal cramps. He has had recent endoscopy and management of his esophagitis and gastroparesis by Dr. Russella Dar.  Comorbidities include a history of bleeding duodenal ulcer 1994, hemorrhoids, cervical disc surgery 2010, mild obstructive  sleep apnea, erectile dysfunction, and chronic anxiety.    Hospital Course: On the day of admission the patient was taken to the operating room.He underwent laparoscopy with findings of a larger hernia in the midline above the umbilicus containing incarcerated omentum, a small hernia in the midline above that, had an umbilical hernia. After taking down the adhesions we were able to repair these laparoscopically With a large piece of parietex PCO composite mesh.Postoperatively he had problems with urinary retention as well as nausea and vomiting. He required a Foley catheter for 24 hours and once that was removed and he became more mobile he did fine and had no further voiding problems. The nausea and vomiting was felt to be multifactorial. He was maintained on double dose proton pump inhibitors and also Reglan initially IV and subsequently orally. His nausea and vomiting resolved within 24 hours and he resumed his diet. He progressed in his diet and activities without any difficulties. He was passing lots of flatus but had not had a bowel movement. The day of discharge his abdomen is soft and flat and nontender. The abdomen or to go home. He was given advice and diet and activities. He was given advice regarding management of his chronic constipation and chronic gastroparesis. He was asked to return to see me in the office in 2-3 weeks.  Consults: None  Significant Diagnostic Studies: none  Treatments: surgery: Laparoscopic lyses of adhesions and laparoscopic repair of multiple incisional hernias with mesh.  Disposition:  Home  Patient Instructions:    Medication List    TAKE these medications       celecoxib 200 MG capsule  Commonly known as:  CELEBREX  Take 200 mg by mouth 2 (two) times daily.     HYDROcodone-acetaminophen 5-500 MG per tablet  Commonly known as:  VICODIN  Take 1 tablet by mouth every 8 (eight) hours as needed for pain. pain     HYDROcodone-acetaminophen 5-325 MG per tablet  Commonly known as:  NORCO/VICODIN  Take 1-2 tablets by mouth every 4 (four) hours as needed for pain.     LORazepam 0.5 MG tablet  Commonly known as:  ATIVAN  Take 0.5 mg by mouth daily as needed for anxiety.     metaxalone 800 MG tablet  Commonly known as:  SKELAXIN  Take 800 mg by mouth 3 (three) times daily as needed for pain.     omeprazole 40 MG capsule  Commonly known as:  PRILOSEC  Take 1 capsule (40 mg total) by mouth daily.     sertraline 100 MG tablet  Commonly known as:  ZOLOFT  Take 100 mg by mouth Daily.        Activity: ambulation stressed. No sports or heavy lifting for 4-6 weeks. Diet: low fat, low cholesterol diet Wound Care: none needed  Follow-up:  With Dr. Derrell Lolling in 3 weeks.  Signed: Angelia Mould. Derrell Lolling, M.D., FACS General and minimally invasive surgery Breast and Colorectal Surgery  12/29/2012, 6:52 AM

## 2013-01-01 ENCOUNTER — Emergency Department (HOSPITAL_COMMUNITY): Payer: BC Managed Care – PPO

## 2013-01-01 ENCOUNTER — Inpatient Hospital Stay (HOSPITAL_COMMUNITY): Payer: BC Managed Care – PPO

## 2013-01-01 ENCOUNTER — Encounter (HOSPITAL_COMMUNITY): Payer: Self-pay | Admitting: Adult Health

## 2013-01-01 ENCOUNTER — Inpatient Hospital Stay (HOSPITAL_COMMUNITY)
Admission: EM | Admit: 2013-01-01 | Discharge: 2013-01-07 | DRG: 078 | Disposition: A | Discharge status: Home / Self Care | Payer: BC Managed Care – PPO | Attending: Internal Medicine | Admitting: Internal Medicine

## 2013-01-01 DIAGNOSIS — Z79899 Other long term (current) drug therapy: Secondary | ICD-10-CM

## 2013-01-01 DIAGNOSIS — R042 Hemoptysis: Secondary | ICD-10-CM | POA: Diagnosis present

## 2013-01-01 DIAGNOSIS — E785 Hyperlipidemia, unspecified: Secondary | ICD-10-CM | POA: Diagnosis present

## 2013-01-01 DIAGNOSIS — K219 Gastro-esophageal reflux disease without esophagitis: Secondary | ICD-10-CM | POA: Diagnosis present

## 2013-01-01 DIAGNOSIS — F411 Generalized anxiety disorder: Secondary | ICD-10-CM | POA: Diagnosis present

## 2013-01-01 DIAGNOSIS — G4733 Obstructive sleep apnea (adult) (pediatric): Secondary | ICD-10-CM | POA: Diagnosis present

## 2013-01-01 DIAGNOSIS — R0603 Acute respiratory distress: Secondary | ICD-10-CM

## 2013-01-01 DIAGNOSIS — I059 Rheumatic mitral valve disease, unspecified: Secondary | ICD-10-CM | POA: Diagnosis present

## 2013-01-01 DIAGNOSIS — R6510 Systemic inflammatory response syndrome (SIRS) of non-infectious origin without acute organ dysfunction: Secondary | ICD-10-CM

## 2013-01-01 DIAGNOSIS — R0902 Hypoxemia: Secondary | ICD-10-CM

## 2013-01-01 DIAGNOSIS — F3289 Other specified depressive episodes: Secondary | ICD-10-CM | POA: Diagnosis present

## 2013-01-01 DIAGNOSIS — F329 Major depressive disorder, single episode, unspecified: Secondary | ICD-10-CM | POA: Diagnosis present

## 2013-01-01 DIAGNOSIS — K5900 Constipation, unspecified: Secondary | ICD-10-CM | POA: Diagnosis present

## 2013-01-01 DIAGNOSIS — I2699 Other pulmonary embolism without acute cor pulmonale: Principal | ICD-10-CM | POA: Diagnosis present

## 2013-01-01 DIAGNOSIS — M129 Arthropathy, unspecified: Secondary | ICD-10-CM | POA: Diagnosis present

## 2013-01-01 HISTORY — DX: Other intervertebral disc degeneration, lumbosacral region: M51.37

## 2013-01-01 HISTORY — DX: Dorsalgia, unspecified: M54.9

## 2013-01-01 HISTORY — DX: Personal history of other medical treatment: Z92.89

## 2013-01-01 HISTORY — DX: Unspecified osteoarthritis, unspecified site: M19.90

## 2013-01-01 HISTORY — DX: Other intervertebral disc degeneration, lumbosacral region without mention of lumbar back pain or lower extremity pain: M51.379

## 2013-01-01 HISTORY — DX: Other pulmonary embolism without acute cor pulmonale: I26.99

## 2013-01-01 HISTORY — DX: Other specified postprocedural states: Z98.890

## 2013-01-01 HISTORY — DX: Family history of other specified conditions: Z84.89

## 2013-01-01 HISTORY — DX: Other intervertebral disc degeneration, thoracolumbar region: M51.35

## 2013-01-01 HISTORY — DX: Other chronic pain: G89.29

## 2013-01-01 HISTORY — DX: Shortness of breath: R06.02

## 2013-01-01 HISTORY — DX: Nausea with vomiting, unspecified: R11.2

## 2013-01-01 HISTORY — DX: Other cervical disc degeneration, unspecified cervical region: M50.30

## 2013-01-01 LAB — BASIC METABOLIC PANEL
BUN: 14 mg/dL (ref 6–23)
CO2: 27 mEq/L (ref 19–32)
Chloride: 97 mEq/L (ref 96–112)
GFR calc Af Amer: >90 mL/min (ref >90)
Potassium: 3.9 mEq/L (ref 3.5–5.1)

## 2013-01-01 LAB — HEPARIN LEVEL (UNFRACTIONATED): Heparin Unfractionated: 0.28 IU/mL — ABNORMAL LOW (ref 0.30–0.70)

## 2013-01-01 LAB — CBC
HCT: 39.3 % (ref 39.0–52.0)
MCV: 85.8 fL (ref 78.0–100.0)
Platelets: 245 10*3/uL (ref 150–400)
RBC: 4.58 MIL/uL (ref 4.22–5.81)
RDW: 12.7 % (ref 11.5–15.5)
WBC: 15.6 10*3/uL — ABNORMAL HIGH (ref 4.0–10.5)

## 2013-01-01 LAB — HEPATIC FUNCTION PANEL
Alkaline Phosphatase: 158 U/L — ABNORMAL HIGH (ref 39–117)
Bilirubin, Direct: 0.3 mg/dL (ref 0.0–0.3)
Indirect Bilirubin: 0.8 mg/dL (ref 0.3–0.9)
Total Bilirubin: 1.1 mg/dL (ref 0.3–1.2)
Total Protein: 8.2 g/dL (ref 6.0–8.3)

## 2013-01-01 LAB — PROTIME-INR: Prothrombin Time: 13.7 seconds (ref 11.6–15.2)

## 2013-01-01 LAB — TYPE AND SCREEN: ABO/RH(D): O POS

## 2013-01-01 LAB — HOMOCYSTEINE: Homocysteine: 10.3 umol/L (ref 4.0–15.4)

## 2013-01-01 MED ORDER — ONDANSETRON HCL 4 MG PO TABS: 4.0000 mg | ORAL_TABLET | Freq: Four times a day (QID) | ORAL | Status: DC | PRN

## 2013-01-01 MED ORDER — SODIUM CHLORIDE 0.9 % IJ SOLN
3.0000 mL | Freq: Two times a day (BID) | INTRAMUSCULAR | Status: DC
Start: 2013-01-01 — End: 2013-01-07
  Administered 2013-01-01 – 2013-01-07 (×4): 3 mL via INTRAVENOUS

## 2013-01-01 MED ORDER — ONDANSETRON HCL 4 MG/2ML IJ SOLN
4.0000 mg | Freq: Four times a day (QID) | INTRAMUSCULAR | Status: DC | PRN
  Administered 2013-01-03 – 2013-01-06 (×3): 4 mg via INTRAVENOUS
  Filled 2013-01-01 (×3): qty 2

## 2013-01-01 MED ORDER — IOHEXOL 350 MG/ML SOLN
100.0000 mL | Freq: Once | INTRAVENOUS | Status: AC | PRN
  Administered 2013-01-01: 100 mL via INTRAVENOUS

## 2013-01-01 MED ORDER — ONDANSETRON HCL 4 MG/2ML IJ SOLN
4.0000 mg | Freq: Once | INTRAMUSCULAR | Status: AC
  Administered 2013-01-01: 4 mg via INTRAVENOUS

## 2013-01-01 MED ORDER — HEPARIN BOLUS VIA INFUSION
1400.0000 [IU] | INTRAVENOUS | Status: AC
  Administered 2013-01-01: 1400 [IU] via INTRAVENOUS
  Filled 2013-01-01: qty 1400

## 2013-01-01 MED ORDER — BISACODYL 10 MG RE SUPP: 10.0000 mg | Freq: Every day | RECTAL | Status: DC | PRN

## 2013-01-01 MED ORDER — DOCUSATE SODIUM 100 MG PO CAPS
200.0000 mg | ORAL_CAPSULE | Freq: Two times a day (BID) | ORAL | Status: DC
  Administered 2013-01-01 – 2013-01-07 (×13): 200 mg via ORAL
  Filled 2013-01-01 (×10): qty 2
  Filled 2013-01-01: qty 1
  Filled 2013-01-01: qty 2
  Filled 2013-01-01: qty 1
  Filled 2013-01-01 (×3): qty 2

## 2013-01-01 MED ORDER — ONDANSETRON HCL 4 MG/2ML IJ SOLN
INTRAMUSCULAR | Status: AC
  Filled 2013-01-01: qty 2

## 2013-01-01 MED ORDER — HEPARIN BOLUS VIA INFUSION: 60.0000 [IU]/kg | Freq: Once | INTRAVENOUS | Status: DC

## 2013-01-01 MED ORDER — LORAZEPAM 0.5 MG PO TABS: 0.5000 mg | ORAL_TABLET | Freq: Every day | ORAL | Status: DC | PRN

## 2013-01-01 MED ORDER — HEPARIN (PORCINE) IN NACL 100-0.45 UNIT/ML-% IJ SOLN
2050.0000 [IU]/h | INTRAMUSCULAR | Status: DC
  Administered 2013-01-01: 1400 [IU]/h via INTRAVENOUS
  Administered 2013-01-01: 1600 [IU]/h via INTRAVENOUS
  Administered 2013-01-02: 2050 [IU]/h via INTRAVENOUS
  Administered 2013-01-02: 1850 [IU]/h via INTRAVENOUS
  Administered 2013-01-03 – 2013-01-06 (×6): 2050 [IU]/h via INTRAVENOUS
  Filled 2013-01-01 (×14): qty 250

## 2013-01-01 MED ORDER — HYDROMORPHONE HCL PF 1 MG/ML IJ SOLN
1.0000 mg | INTRAMUSCULAR | Status: DC | PRN
  Administered 2013-01-01 – 2013-01-06 (×6): 1 mg via INTRAVENOUS
  Filled 2013-01-01 (×6): qty 1

## 2013-01-01 MED ORDER — METAXALONE 800 MG PO TABS
800.0000 mg | ORAL_TABLET | Freq: Three times a day (TID) | ORAL | Status: DC | PRN
Start: 2013-01-01 — End: 2013-01-07
  Filled 2013-01-01: qty 1

## 2013-01-01 MED ORDER — HYDROCODONE-ACETAMINOPHEN 5-325 MG PO TABS
1.0000 | ORAL_TABLET | Freq: Four times a day (QID) | ORAL | Status: DC | PRN
  Administered 2013-01-01 – 2013-01-07 (×7): 1 via ORAL
  Filled 2013-01-01 (×7): qty 1

## 2013-01-01 MED ORDER — HYDROMORPHONE HCL PF 2 MG/ML IJ SOLN: 2.0000 mg | Freq: Once | INTRAMUSCULAR | Status: DC

## 2013-01-01 MED ORDER — CELECOXIB 200 MG PO CAPS
200.0000 mg | ORAL_CAPSULE | Freq: Every day | ORAL | Status: DC
  Administered 2013-01-01: 200 mg via ORAL
  Filled 2013-01-01 (×2): qty 1

## 2013-01-01 MED ORDER — PANTOPRAZOLE SODIUM 40 MG PO TBEC
40.0000 mg | DELAYED_RELEASE_TABLET | Freq: Every day | ORAL | Status: DC
  Administered 2013-01-01 – 2013-01-07 (×7): 40 mg via ORAL
  Filled 2013-01-01 (×7): qty 1

## 2013-01-01 MED ORDER — ALUM & MAG HYDROXIDE-SIMETH 200-200-20 MG/5ML PO SUSP
30.0000 mL | Freq: Four times a day (QID) | ORAL | Status: DC | PRN
  Filled 2013-01-01: qty 30

## 2013-01-01 MED ORDER — HEPARIN (PORCINE) IN NACL 100-0.45 UNIT/ML-% IJ SOLN: 12.0000 [IU]/kg/h | INTRAMUSCULAR | Status: DC

## 2013-01-01 MED ORDER — HEPARIN BOLUS VIA INFUSION
4000.0000 [IU] | Freq: Once | INTRAVENOUS | Status: AC
  Administered 2013-01-01: 4000 [IU] via INTRAVENOUS

## 2013-01-01 MED ORDER — ACETAMINOPHEN 650 MG RE SUPP: 650.0000 mg | Freq: Four times a day (QID) | RECTAL | Status: DC | PRN

## 2013-01-01 MED ORDER — ADULT MULTIVITAMIN W/MINERALS CH
1.0000 | ORAL_TABLET | Freq: Every day | ORAL | Status: DC
  Administered 2013-01-01 – 2013-01-07 (×7): 1 via ORAL
  Filled 2013-01-01 (×7): qty 1

## 2013-01-01 MED ORDER — ACETAMINOPHEN 325 MG PO TABS: 650.0000 mg | ORAL_TABLET | Freq: Four times a day (QID) | ORAL | Status: DC | PRN

## 2013-01-01 MED ORDER — MAGNESIUM HYDROXIDE 400 MG/5ML PO SUSP
30.0000 mL | Freq: Every day | ORAL | Status: DC
  Administered 2013-01-01 – 2013-01-04 (×3): 30 mL via ORAL
  Filled 2013-01-01 (×7): qty 30

## 2013-01-01 MED ORDER — HYDROMORPHONE HCL PF 1 MG/ML IJ SOLN
INTRAMUSCULAR | Status: AC
  Administered 2013-01-01: 1 mg
  Filled 2013-01-01: qty 1

## 2013-01-01 MED ORDER — SERTRALINE HCL 100 MG PO TABS
100.0000 mg | ORAL_TABLET | Freq: Every day | ORAL | Status: DC
  Administered 2013-01-01 – 2013-01-07 (×7): 100 mg via ORAL
  Filled 2013-01-01 (×7): qty 1

## 2013-01-01 MED ORDER — POLYETHYLENE GLYCOL 3350 17 G PO PACK
17.0000 g | PACK | Freq: Two times a day (BID) | ORAL | Status: DC
  Administered 2013-01-01 – 2013-01-02 (×3): 17 g via ORAL
  Filled 2013-01-01 (×4): qty 1

## 2013-01-01 NOTE — ED Provider Notes (Signed)
History     CSN: [id number]  Arrival date & time 01/01/13  0135   First MD Initiated Contact with Patient 01/01/13 0203      Chief Complaint  Patient presents with  . Hemoptysis    (Consider location/radiation/quality/duration/timing/severity/associated sxs/prior treatment) HPI  Please note that this is a late entry. This patient was seen and evaluated by me shortly after his presentation to the emergency department.  This patient is a middle-aged man who is status post ventral hernia repair approximately 7 days ago. He had prolonged postoperative course complicated by ileus. The patient is here today with complaints of hemoptysis, pleuritic right-sided chest pain and shortness of breath.  The patient's wife notes that while he was hospitalized and on the monitor, he had several episodes of desaturations. She said she also noticed that the patient seems to be panting at times. The patient says he has felt short of breath for the past several days.  Just prior to arrival, he had hemoptysis and coughed up to quarter-sized clots. He has no history of hemoptysis. He has not had a cough. Pleuritic chest pain began yesterday. No fever.  Pain as 8/10, nonradiating and worse with inspiration.  Past Medical History  Diagnosis Date  . Arthritis   . Anxiety     panic attacks  . Ulcer     gastric 1988  . Low back pain   . Obstructive sleep apnea     miald w aith RDI of 6.4 per hour  . Chronic neck pain   . Peptic ulcer 1994    severe requiring vagotomy and pyloroplasty  . Hoarseness of voice     chronic  . Dyslipidemia   . Erectile dysfunction     organic  . Family history of colon cancer   . Lipoma of arm 1994    right  . Blood transfusion without reported diagnosis   . Constipation   . Diarrhea   . Nasal congestion   . Trouble swallowing   . Palpitations   . Abdominal distension   . Rectal bleeding   . Blood in stool   . Generalized headaches   . Difficulty urinating    . Tubular adenoma of colon 08/2012  . Mitral valve prolapse   . Anemia   . GERD (gastroesophageal reflux disease)   . Depression     Past Surgical History  Procedure Laterality Date  . Stomach surgery  1988    ulcers  . Colonoscopy  07/2007    showed polyps, follow up in 2013  . Tonsillectomy  2008  . Neck surgery      fusion  . Hernia repair  12/25/12    ventral incisional with mesh  . Incisional hernia repair N/A 12/25/2012    Procedure: LAPAROSCOPIC REPAIR MULITPLE INCISIONAL HERNIA WITH MESH;  Surgeon: Ernestene Mention, MD;  Location: [facility] OR;  Service: General;  Laterality: N/A;  . Insertion of mesh N/A 12/25/2012    Procedure: INSERTION OF MESH;  Surgeon: Ernestene Mention, MD;  Location: [facility] OR;  Service: General;  Laterality: N/A;    Family History  Problem Relation Age of Onset  . Colon cancer Mother   . Lymphoma Mother     non- hodgkins  . COPD Mother   . Melanoma Mother   . Ovarian cancer Sister   . Ulcerative colitis Sister   . Kidney cancer Father   . Heart disease Father   . Cancer Sister     brain  .  Breast cancer Sister     History  Substance Use Topics  . Smoking status: Never Smoker   . Smokeless tobacco: Never Used  . Alcohol Use: Yes     Comment: rarely      Review of Systems Gen: no weight loss, fevers, chills, night sweats Eyes: no discharge or drainage, no occular pain or visual changes Nose: no epistaxis or rhinorrhea Mouth: no dental pain, no sore throat Neck: no neck pain Lungs: As per history of present illness, otherwise negative CV:  As per history of present illness, otherwise negative Abd: As per history of present illness, otherwise negative GU: no dysuria or gross hematuria MSK: no myalgias or arthralgias Neuro: no headache, no focal neurologic deficits Skin: no rash Psyche: negative.  Allergies  Morphine and related  Home Medications   Current Outpatient Rx  Name  Route  Sig  Dispense  Refill  . celecoxib (CELEBREX)  200 MG capsule   Oral   Take 200 mg by mouth daily.          [name] docusate sodium (COLACE) 100 MG capsule   Oral   Take 200 mg by mouth 2 (two) times daily as needed for constipation.         [name] HYDROcodone-acetaminophen (VICODIN) 5-500 MG per tablet   Oral   Take 1 tablet by mouth every 6 (six) hours as needed for pain. pain         . LORazepam (ATIVAN) 0.5 MG tablet   Oral   Take 0.5 mg by mouth daily as needed for anxiety.          . metaxalone (SKELAXIN) 800 MG tablet   Oral   Take 800 mg by mouth 3 (three) times daily as needed for pain.          . Multiple Vitamin (MULTIVITAMIN WITH MINERALS) TABS   Oral   Take 1 tablet by mouth daily.         [name] omeprazole (PRILOSEC) 40 MG capsule   Oral   Take 1 capsule (40 mg total) by mouth daily.   30 capsule   11   . polyethylene glycol (MIRALAX / GLYCOLAX) packet   Oral   Take 17 g by mouth 2 (two) times daily.         . sertraline (ZOLOFT) 100 MG tablet   Oral   Take 100 mg by mouth Daily.            BP 112/77  Pulse 93  Temp(Src) 100.3 F (37.9 C) (Oral)  Resp 23  SpO2 97%  Physical Exam Gen: well developed and well nourished appearing, appears anxious and uncomfortable Head: NCAT Eyes: PERL, EOMI Nose: no epistaixis or rhinorrhea Mouth/throat: mucosa is moist and pink Neck: supple, no stridor Lungs: RR 32/min, BS diminished on right, no rales, wheezing or rhonchi, RA sats 88% CV: rapid and regular, extremities well perfused.  Abd: soft, notender, nondistended Back: no ttp, no cva ttp Skin: no rashese, wnl Neuro: CN ii-xii grossly intact, no focal deficits Psyche; normal affect,  calm and cooperative.   ED Course  Procedures (including critical care time)  Ct Angio Chest W/cm &/or Wo Cm  01/01/2013  *RADIOLOGY REPORT*  Clinical Data: Hemoptysis.  Postoperative from abdominal hernia repair on Monday.  Coughing up large blood clot since yesterday. Shortness of breath and right sided flank pain.   CT ANGIOGRAPHY CHEST  Technique:  Multidetector CT imaging of the chest using the standard protocol during bolus administration  of intravenous contrast. Multiplanar reconstructed images including MIPs were obtained and reviewed to evaluate the vascular anatomy.  Contrast: OMNIPAQUE IOHEXOL 350 MG/ML SOLN  Comparison: None.  Findings: Technically adequate study with good opacification of the central and segmental pulmonary arteries.  There are filling defects demonstrated in the segmental and subsegmental right lower lobe pulmonary arteries consistent with peripheral pulmonary emboli.  No central pulmonary emboli are demonstrated.  There is infiltration or consolidation in the right lung base near the area of embolus, suggesting pulmonary infarct.  Small bilateral pleural effusions.  Atelectasis is also present on the left lung base.  Normal heart size.  Normal caliber thoracic aorta.  Esophagus is mostly decompressed.  No significant lymphadenopathy in the chest. No pneumothorax.  Visualized portions of the upper abdominal organs are unremarkable.  Surgical clips at the EG junction.  IMPRESSION: Positive study for pulmonary emboli in right lower lobe segmental and subsegmental branches.  Infiltration or atelectasis in both lung bases, possibly representing infarct on the right.  Small pleural effusions.  Results were discussed by telephone with Dr. Lavella Lemons at 0425 hours on 01/01/2013.   Original Report Authenticated By: Burman Nieves, M.D.    Dg Chest Portable 1 View  01/01/2013  *RADIOLOGY REPORT*  Clinical Data: Hemoptysis.  Shortness of breath.  Recent abdominal surgery.  PORTABLE CHEST - 1 VIEW  Comparison: 12/18/2012  Findings: Shallow inspiration with linear atelectasis in both mid and lower lungs.  This is new since previous study.  No focal consolidation.  No blunting of costophrenic angles.  No pneumothorax.  Heart size and pulmonary vascularity are normal. The surgical clips in the upper abdomen.   IMPRESSION: Shallow inspiration with linear atelectasis in the mid and lower lungs bilaterally.   Original Report Authenticated By: Burman Nieves, M.D.      Results for orders placed during the hospital encounter of 01/01/13 (from the past 24 hour(s))  CBC     Status: Abnormal   Collection Time    01/01/13  2:10 AM      Result Value Range   WBC 15.6 (*) 4.0 - 10.5 K/uL   RBC 4.58  4.22 - 5.81 MIL/uL   Hemoglobin 14.0  13.0 - 17.0 g/dL   HCT 16.1  09.6 - 04.5 %   MCV 85.8  78.0 - 100.0 fL   MCH 30.6  26.0 - 34.0 pg   MCHC 35.6  30.0 - 36.0 g/dL   RDW 40.9  81.1 - 91.4 %   Platelets 245  150 - 400 K/uL  BASIC METABOLIC PANEL     Status: Abnormal   Collection Time    01/01/13  2:10 AM      Result Value Range   Sodium 136  135 - 145 mEq/L   Potassium 3.9  3.5 - 5.1 mEq/L   Chloride 97  96 - 112 mEq/L   CO2 27  19 - 32 mEq/L   Glucose, Bld 109 (*) 70 - 99 mg/dL   BUN 14  6 - 23 mg/dL   Creatinine, Ser 7.82  0.50 - 1.35 mg/dL   Calcium 9.9  8.4 - 95.6 mg/dL   GFR calc non Af Amer 89 (*) >90 mL/min   GFR calc Af Amer >90  >90 mL/min  HEPATIC FUNCTION PANEL     Status: Abnormal   Collection Time    01/01/13  2:10 AM      Result Value Range   Total Protein 8.2  6.0 - 8.3 g/dL  Albumin 3.9  3.5 - 5.2 g/dL   AST 38 (*) 0 - 37 U/L   ALT 48  0 - 53 U/L   Alkaline Phosphatase 158 (*) 39 - 117 U/L   Total Bilirubin 1.1  0.3 - 1.2 mg/dL   Bilirubin, Direct 0.3  0.0 - 0.3 mg/dL   Indirect Bilirubin 0.8  0.3 - 0.9 mg/dL  PROTIME-INR     Status: None   Collection Time    01/01/13  2:10 AM      Result Value Range   Prothrombin Time 13.7  11.6 - 15.2 seconds   INR 1.06  0.00 - 1.49  APTT     Status: None   Collection Time    01/01/13  2:10 AM      Result Value Range   aPTT 34  24 - 37 seconds  TYPE AND SCREEN     Status: None   Collection Time    01/01/13  2:25 AM      Result Value Range   ABO/RH(D) O POS     Antibody Screen NEG     Sample Expiration 01/04/2013    ABO/RH      Status: None   Collection Time    01/01/13  2:25 AM      Result Value Range   ABO/RH(D) O POS     EKG: sinus tach, no acute ischemic changes, normal intervals, normal axis, normal qrs complex   MDM  Patient with hypoxia, tachycardia and signs of infarct right lower lobe. We are treating with heparin bolus and gtt.  Treating supportively with analgesia and supplemental 02. Case discussed with on call/admitting physician for [facility] who agrees with ED management and has accepted the patient for admission to the telemetry unit.   CRITICAL CARE Performed by: Brandt Loosen   Total critical care time: 13m  Critical care time was exclusive of separately billable procedures and treating other patients.  Critical care was necessary to treat or prevent imminent or life-threatening deterioration.  Critical care was time spent personally by me on the following activities: development of treatment plan with patient and/or surrogate as well as nursing, discussions with consultants, evaluation of patient's response to treatment, examination of patient, obtaining history from patient or surrogate, ordering and performing treatments and interventions, ordering and review of laboratory studies, ordering and review of radiographic studies, pulse oximetry and re-evaluation of patient's condition.         Brandt Loosen, MD 01/01/13 [phone]

## 2013-01-01 NOTE — Progress Notes (Signed)
Utilization Review Completed.Asif Muchow T4/28/2014  

## 2013-01-01 NOTE — Progress Notes (Signed)
ANTICOAGULATION CONSULT NOTE - Follow up  Pharmacy Consult for heparin Indication: pulmonary embolus  Allergies  Allergen Reactions  . Morphine And Related Other (See Comments)    hallucinations    Patient Measurements: Height: 5\' 9"  (175.3 cm) Weight: 197 lb 12 oz (89.7 kg) IBW/kg (Calculated) : 70.7 Heparin Dosing Weight: 90kg  Vital Signs: Temp: 98.4 F (36.9 C) (04/28 0658) Temp src: Oral (04/28 0658) BP: 109/71 mmHg (04/28 0658) Pulse Rate: 97 (04/28 0658)  Labs:  Recent Labs  01/01/13 0210 01/01/13 1228  HGB 14.0  --   HCT 39.3  --   PLT 245  --   APTT 34  --   LABPROT 13.7  --   INR 1.06  --   HEPARINUNFRC  --  0.28*  CREATININE 1.00  --     Estimated Creatinine Clearance: 102.2 ml/min (by C-G formula based on Cr of 1).   Medical History: Past Medical History  Diagnosis Date  . Arthritis   . Anxiety     panic attacks  . Ulcer     gastric 1988  . Low back pain   . Obstructive sleep apnea     miald w aith RDI of 6.4 per hour  . Chronic neck pain   . Peptic ulcer 1994    severe requiring vagotomy and pyloroplasty  . Hoarseness of voice     chronic  . Dyslipidemia   . Erectile dysfunction     organic  . Family history of colon cancer   . Lipoma of arm 1994    right  . Blood transfusion without reported diagnosis   . Constipation   . Diarrhea   . Nasal congestion   . Trouble swallowing   . Palpitations   . Abdominal distension   . Rectal bleeding   . Blood in stool   . Generalized headaches   . Difficulty urinating   . Tubular adenoma of colon 08/2012  . Mitral valve prolapse   . Anemia   . GERD (gastroesophageal reflux disease)   . Depression      Assessment: 6 hr heparin level = 0.28 after 4000 units IV heparin bolus and heparin infusing at 1400 units/hr for +PE. This is a 47yo male who  had hernia repair one week ago and was discharged on 4/25. He began coughing up blood with clots today with associated sharp pain on R side to  shoulder. CT revealed PE. Initital 6 hour heparin level is slightly low end of therapeutic goal 0.3-0.7 units/ml. No report of new bleeding.   Goal of Therapy:  Heparin level 0.3-0.7 units/ml Monitor platelets by anticoagulation protocol: Yes   Plan:  Will give heparin bolus of 1400 units x1 and increase heparin drip rate to 1600 units/hr. Monitor hepari level 6 hours after bolus/rate increase. Then daily heparin levels and CBC daily.  Noah Delaine, RPh Clinical Pharmacist Pager: [phone] 01/01/2013,1:55 PM

## 2013-01-01 NOTE — ED Notes (Signed)
Pt from home.  Had hernia repair on Monday 4/21, discharged from hospital on Friday.  States that he began to cough up blood today.  Clots noted.  States that he is hurting on his (R) side.  States that he has sharp pain shooting up into his (R) shoulder.  Pt calm and cooperative.  A/O x 4.

## 2013-01-01 NOTE — Progress Notes (Signed)
Notified admission nurse of pt's pending admission assessment to be done.

## 2013-01-01 NOTE — Progress Notes (Signed)
ANTICOAGULATION CONSULT NOTE - Follow up  Pharmacy Consult for heparin Indication: pulmonary embolus  Allergies  Allergen Reactions  . Morphine And Related Other (See Comments)    hallucinations    Patient Measurements: Height: 5\' 9"  (175.3 cm) Weight: 197 lb 12 oz (89.7 kg) IBW/kg (Calculated) : 70.7 Heparin Dosing Weight: 90kg  Vital Signs: Temp: 98.2 F (36.8 C) (04/28 1406) Temp src: Oral (04/28 1406) BP: 123/74 mmHg (04/28 1406) Pulse Rate: 83 (04/28 1406)  Labs:  Recent Labs  01/01/13 0210 01/01/13 1228 01/01/13 2005  HGB 14.0  --   --   HCT 39.3  --   --   PLT 245  --   --   APTT 34  --   --   LABPROT 13.7  --   --   INR 1.06  --   --   HEPARINUNFRC  --  0.28* 0.28*  CREATININE 1.00  --   --     Estimated Creatinine Clearance: 102.2 ml/min (by C-G formula based on Cr of 1).   Medical History: Past Medical History  Diagnosis Date  . Arthritis   . Anxiety     panic attacks  . Ulcer     gastric 1988  . Peptic ulcer 1994    severe requiring vagotomy and pyloroplasty  . Hoarseness of voice     chronic  . Dyslipidemia     "good cholesterol isn't as high as dr would like" (01/01/2013)  . Erectile dysfunction     organic  . Family history of colon cancer   . Lipoma of arm 1994    right  . Constipation   . Diarrhea   . Nasal congestion   . Trouble swallowing   . Palpitations   . Abdominal distension   . Rectal bleeding   . Blood in stool   . Difficulty urinating   . Tubular adenoma of colon 08/2012  . Mitral valve prolapse     "mild" (01/01/2013)  . Anemia   . GERD (gastroesophageal reflux disease)   . Depression   . PONV (postoperative nausea and vomiting)   . Family history of anesthesia complication     "Mom; PONV" (01/01/2013)  . Pulmonary embolism on right 01/01/2013  . Shortness of breath     "related to the PE today" (01/01/2013)  . Obstructive sleep apnea     miald w aith RDI of 6.4 per hour  . History of blood transfusion 1980's     "related to bleeding ulcers" (01/01/2013)  . Generalized headaches     "monthly" (01/01/2013)  . DDD (degenerative disc disease), thoracolumbar   . DDD (degenerative disc disease), cervical   . DDD (degenerative disc disease), lumbosacral   . Osteoarthritis     "hips & hands" (01/01/2013)  . Chronic back pain     "neck to sacral" (01/01/2013)     Assessment: This is a 47yo male who  had hernia repair one week ago and was discharged on 4/25. He began coughing up blood with clots today with associated sharp pain on R side to shoulder. CT revealed PE.  Heparin level remain unchanged at  0.28 at 1600 units/hr for +PE at slightly low end of therapeutic goal 0.3-0.7 units/ml. No report of new bleeding.   Goal of Therapy:  Heparin level 0.3-0.7 units/ml Monitor platelets by anticoagulation protocol: Yes   Plan:  Increase IV heparin to 1850 units/hr and check level with am labs.  Lynasia Meloche S. Merilynn Finland, PharmD, BCPS Clinical Staff Pharmacist  Pager [phone]  01/01/2013,9:22 PM

## 2013-01-01 NOTE — Progress Notes (Signed)
Notified lab to add the coagulation panel to the original sample of blood drawn in ed.  States, "can not be added on d/t types of tubes needed and it includes 11 tubes for this panel".

## 2013-01-01 NOTE — H&P (Signed)
Paul Griffin is an 47 y.o. male.   Chief Complaint: coughing up blood HPI:  The patient is a 47 year old Caucasian man who is generally in very good health. Early this past week he had repair of of a postsurgical abdominal hernia done with mesh. He tolerated the procedure well and was discharged to home 3 days ago. At home he developed increasing dyspnea on exertion with hemoptysis, so he presented to the emergency room for evaluation last night. Workup showed acute pulmonary embolism with probable pulmonary infarction of the right side. Currently he continues to have moderate right-sided pleuritic chest pain and dyspnea with any activity. He has not had any bowel movements for several days and has decreased appetite. He does not have a history of DVT or pulmonary embolism. His mother had a DVT, however. His medical history is as described below:  Past Medical History  Diagnosis Date  . Arthritis   . Anxiety     panic attacks  . Ulcer     gastric 1988  . Low back pain   . Obstructive sleep apnea     miald w aith RDI of 6.4 per hour  . Chronic neck pain   . Peptic ulcer 1994    severe requiring vagotomy and pyloroplasty  . Hoarseness of voice     chronic  . Dyslipidemia   . Erectile dysfunction     organic  . Family history of colon cancer   . Lipoma of arm 1994    right  . Blood transfusion without reported diagnosis   . Constipation   . Diarrhea   . Nasal congestion   . Trouble swallowing   . Palpitations   . Abdominal distension   . Rectal bleeding   . Blood in stool   . Generalized headaches   . Difficulty urinating   . Tubular adenoma of colon 08/2012  . Mitral valve prolapse   . Anemia   . GERD (gastroesophageal reflux disease)   . Depression     Medications Prior to Admission  Medication Sig Dispense Refill  . celecoxib (CELEBREX) 200 MG capsule Take 200 mg by mouth daily.       [name] docusate sodium (COLACE) 100 MG capsule Take 200 mg by mouth 2 (two) times  daily as needed for constipation.      [name] HYDROcodone-acetaminophen (VICODIN) 5-500 MG per tablet Take 1 tablet by mouth every 6 (six) hours as needed for pain. pain      . LORazepam (ATIVAN) 0.5 MG tablet Take 0.5 mg by mouth daily as needed for anxiety.       . metaxalone (SKELAXIN) 800 MG tablet Take 800 mg by mouth 3 (three) times daily as needed for pain.       . Multiple Vitamin (MULTIVITAMIN WITH MINERALS) TABS Take 1 tablet by mouth daily.      [name] omeprazole (PRILOSEC) 40 MG capsule Take 1 capsule (40 mg total) by mouth daily.  30 capsule  11  . polyethylene glycol (MIRALAX / GLYCOLAX) packet Take 17 g by mouth 2 (two) times daily.      . sertraline (ZOLOFT) 100 MG tablet Take 100 mg by mouth Daily.         ADDITIONAL HOME MEDICATIONS: No additional medications  PHYSICIANS INVOLVED IN CARE: Jarome Matin (PCP), Claud Kelp (general surgery)  Past Surgical History  Procedure Laterality Date  . Stomach surgery  1988    ulcers  . Colonoscopy  07/2007    showed polyps, follow  up in 2013  . Tonsillectomy  2008  . Neck surgery      fusion  . Hernia repair  12/25/12    ventral incisional with mesh  . Incisional hernia repair N/A 12/25/2012    Procedure: LAPAROSCOPIC REPAIR MULITPLE INCISIONAL HERNIA WITH MESH;  Surgeon: Ernestene Mention, MD;  Location: [facility] OR;  Service: General;  Laterality: N/A;  . Insertion of mesh N/A 12/25/2012    Procedure: INSERTION OF MESH;  Surgeon: Ernestene Mention, MD;  Location: [facility] OR;  Service: General;  Laterality: N/A;    Family History  Problem Relation Age of Onset  . Colon cancer Mother   . Lymphoma Mother     non- hodgkins  . COPD Mother   . Melanoma Mother   . Ovarian cancer Sister   . Ulcerative colitis Sister   . Kidney cancer Father   . Heart disease Father   . Cancer Sister     brain  . Breast cancer Sister      Social History:  reports that he has never smoked. He has never used smokeless tobacco. He reports that  drinks  alcohol. He reports that he does not use illicit drugs.  Allergies:  Allergies  Allergen Reactions  . Morphine And Related Other (See Comments)    hallucinations     ROS: arthritis, asthma, chest pain and obstructive sleep apnea, chronic anxiety  PHYSICAL EXAM: Blood pressure 109/71, pulse 97, temperature 98.4 F (36.9 C), temperature source Oral, resp. rate 17, height 5\' 9"  (1.753 m), weight 89.7 kg (197 lb 12 oz), SpO2 94.00%. In general, the patient is a well-nourished well-developed white man who was in no apparent distress while lying at 10 elevation head of bed on nasal cannula oxygen. HEENT exam was within normal limits, neck was supple without jugular venous distention, chest was clear to auscultation with decreased breath sounds in the right base, heart had a regular rate and rhythm without significant murmur or gallop, abdomen had very limited bowel sounds with no tenderness and there were multiple laparoscopy incisions that were clean, dry, and intact. Extremities were without cyanosis, clubbing, or edema, neurologic exam was nonfocal  Results for orders placed during the hospital encounter of 01/01/13 (from the past 48 hour(s))  CBC     Status: Abnormal   Collection Time    01/01/13  2:10 AM      Result Value Range   WBC 15.6 (*) 4.0 - 10.5 K/uL   RBC 4.58  4.22 - 5.81 MIL/uL   Hemoglobin 14.0  13.0 - 17.0 g/dL   HCT 16.1  09.6 - 04.5 %   MCV 85.8  78.0 - 100.0 fL   MCH 30.6  26.0 - 34.0 pg   MCHC 35.6  30.0 - 36.0 g/dL   RDW 40.9  81.1 - 91.4 %   Platelets 245  150 - 400 K/uL  BASIC METABOLIC PANEL     Status: Abnormal   Collection Time    01/01/13  2:10 AM      Result Value Range   Sodium 136  135 - 145 mEq/L   Potassium 3.9  3.5 - 5.1 mEq/L   Chloride 97  96 - 112 mEq/L   CO2 27  19 - 32 mEq/L   Glucose, Bld 109 (*) 70 - 99 mg/dL   BUN 14  6 - 23 mg/dL   Creatinine, Ser 7.82  0.50 - 1.35 mg/dL   Calcium 9.9  8.4 - 95.6 mg/dL  GFR calc non Af Amer 89 (*)  >90 mL/min   GFR calc Af Amer >90  >90 mL/min   Comment:            The eGFR has been calculated     using the CKD EPI equation.     This calculation has not been     validated in all clinical     situations.     eGFR's persistently     <90 mL/min signify     possible Chronic Kidney Disease.  HEPATIC FUNCTION PANEL     Status: Abnormal   Collection Time    01/01/13  2:10 AM      Result Value Range   Total Protein 8.2  6.0 - 8.3 g/dL   Albumin 3.9  3.5 - 5.2 g/dL   AST 38 (*) 0 - 37 U/L   ALT 48  0 - 53 U/L   Alkaline Phosphatase 158 (*) 39 - 117 U/L   Total Bilirubin 1.1  0.3 - 1.2 mg/dL   Bilirubin, Direct 0.3  0.0 - 0.3 mg/dL   Indirect Bilirubin 0.8  0.3 - 0.9 mg/dL  PROTIME-INR     Status: None   Collection Time    01/01/13  2:10 AM      Result Value Range   Prothrombin Time 13.7  11.6 - 15.2 seconds   INR 1.06  0.00 - 1.49  APTT     Status: None   Collection Time    01/01/13  2:10 AM      Result Value Range   aPTT 34  24 - 37 seconds  TYPE AND SCREEN     Status: None   Collection Time    01/01/13  2:25 AM      Result Value Range   ABO/RH(D) O POS     Antibody Screen NEG     Sample Expiration 01/04/2013    ABO/RH     Status: None   Collection Time    01/01/13  2:25 AM      Result Value Range   ABO/RH(D) O POS     Ct Angio Chest W/cm &/or Wo Cm  01/01/2013  *RADIOLOGY REPORT*  Clinical Data: Hemoptysis.  Postoperative from abdominal hernia repair on Monday.  Coughing up large blood clot since yesterday. Shortness of breath and right sided flank pain.  CT ANGIOGRAPHY CHEST  Technique:  Multidetector CT imaging of the chest using the standard protocol during bolus administration of intravenous contrast. Multiplanar reconstructed images including MIPs were obtained and reviewed to evaluate the vascular anatomy.  Contrast: OMNIPAQUE IOHEXOL 350 MG/ML SOLN  Comparison: None.  Findings: Technically adequate study with good opacification of the central and segmental  pulmonary arteries.  There are filling defects demonstrated in the segmental and subsegmental right lower lobe pulmonary arteries consistent with peripheral pulmonary emboli.  No central pulmonary emboli are demonstrated.  There is infiltration or consolidation in the right lung base near the area of embolus, suggesting pulmonary infarct.  Small bilateral pleural effusions.  Atelectasis is also present on the left lung base.  Normal heart size.  Normal caliber thoracic aorta.  Esophagus is mostly decompressed.  No significant lymphadenopathy in the chest. No pneumothorax.  Visualized portions of the upper abdominal organs are unremarkable.  Surgical clips at the EG junction.  IMPRESSION: Positive study for pulmonary emboli in right lower lobe segmental and subsegmental branches.  Infiltration or atelectasis in both lung bases, possibly representing infarct on the right.  Small pleural effusions.  Results were discussed by telephone with Dr. Lavella Lemons at 0425 hours on 01/01/2013.   Original Report Authenticated By: Burman Nieves, M.D.    Dg Chest Portable 1 View  01/01/2013  *RADIOLOGY REPORT*  Clinical Data: Hemoptysis.  Shortness of breath.  Recent abdominal surgery.  PORTABLE CHEST - 1 VIEW  Comparison: 12/18/2012  Findings: Shallow inspiration with linear atelectasis in both mid and lower lungs.  This is new since previous study.  No focal consolidation.  No blunting of costophrenic angles.  No pneumothorax.  Heart size and pulmonary vascularity are normal. The surgical clips in the upper abdomen.  IMPRESSION: Shallow inspiration with linear atelectasis in the mid and lower lungs bilaterally.   Original Report Authenticated By: Burman Nieves, M.D.      Assessment/Plan #1 Acute Pulmonary Embolism: Most likely from recent immobilization associated with abdominal surgery. A hypercoagulable state is also possible given his mother's history of DVT. We will continue IV heparin for now to ensure that he stable  on anticoagulation, and add Coumadin if he remains stable over the next 24-48 hours. We will also check a hypercoagulable panel on initial blood specimen from the emergency room, if this has not already been done. #2 Constipation: Moderately severe with decreased bowel sounds. We'll obtain x-rays of the abdomen and we will also try a soap suds enema as well as increased medications for constipation orally. #3 Anxiety: Well controlled on current medications which will be continued.  Aryn Kops G 01/01/2013, 7:49 AM

## 2013-01-01 NOTE — ED Notes (Signed)
Post op from abdominal hernia repair Monday, discharged Friday from hospital. Began coughing up large blood clots yesterday having SOB and right sided flank and chest pain. Breath sounds diminished on right, pt unable to take deep breath due to worsening pain with inspiration. Wife reports no BM since Sunday of last week, pt reports sleepiness and fatigue.

## 2013-01-01 NOTE — Progress Notes (Signed)
ANTICOAGULATION CONSULT NOTE - Initial Consult  Pharmacy Consult for heparin Indication: pulmonary embolus  Allergies  Allergen Reactions  . Morphine And Related Other (See Comments)    hallucinations    Patient Measurements: Height: 5\' 9"  (175.3 cm) Weight: 205 lb 14.6 oz (93.4 kg) IBW/kg (Calculated) : 70.7 Heparin Dosing Weight: 90kg  Vital Signs: Temp: 100.3 F (37.9 C) (04/28 0409) Temp src: Oral (04/28 0409) BP: 112/77 mmHg (04/28 0409) Pulse Rate: 93 (04/28 0409)  Labs:  Recent Labs  01/01/13 0210  HGB 14.0  HCT 39.3  PLT 245  APTT 34  LABPROT 13.7  INR 1.06  CREATININE 1.00    Estimated Creatinine Clearance: 104.2 ml/min (by C-G formula based on Cr of 1).   Medical History: Past Medical History  Diagnosis Date  . Arthritis   . Anxiety     panic attacks  . Ulcer     gastric 1988  . Low back pain   . Obstructive sleep apnea     miald w aith RDI of 6.4 per hour  . Chronic neck pain   . Peptic ulcer 1994    severe requiring vagotomy and pyloroplasty  . Hoarseness of voice     chronic  . Dyslipidemia   . Erectile dysfunction     organic  . Family history of colon cancer   . Lipoma of arm 1994    right  . Blood transfusion without reported diagnosis   . Constipation   . Diarrhea   . Nasal congestion   . Trouble swallowing   . Palpitations   . Abdominal distension   . Rectal bleeding   . Blood in stool   . Generalized headaches   . Difficulty urinating   . Tubular adenoma of colon 08/2012  . Mitral valve prolapse   . Anemia   . GERD (gastroesophageal reflux disease)   . Depression      Assessment: 47yo male had hernia repair one week ago, discharged Friday, began coughing up blood with clots today with associated sharp pain on R side to shoulder, CT reveals PE, to begin heparin.  Goal of Therapy:  Heparin level 0.3-0.7 units/ml Monitor platelets by anticoagulation protocol: Yes   Plan:  Will give heparin bolus of 4000 units x1  followed by gtt at 1400 units/hr and monitor heparin levels and CBC.  Vernard Gambles, PharmD, BCPS  01/01/2013,5:48 AM

## 2013-01-02 ENCOUNTER — Encounter (HOSPITAL_COMMUNITY): Payer: Self-pay | Admitting: *Deleted

## 2013-01-02 LAB — COMPREHENSIVE METABOLIC PANEL
ALT: 39 U/L (ref 0–53)
AST: 30 U/L (ref 0–37)
Alkaline Phosphatase: 151 U/L — ABNORMAL HIGH (ref 39–117)
CO2: 26 mEq/L (ref 19–32)
Chloride: 98 mEq/L (ref 96–112)
GFR calc Af Amer: >90 mL/min (ref >90)
GFR calc non Af Amer: >90 mL/min (ref >90)
Glucose, Bld: 107 mg/dL — ABNORMAL HIGH (ref 70–99)
Sodium: 134 mEq/L — ABNORMAL LOW (ref 135–145)
Total Bilirubin: 0.5 mg/dL (ref 0.3–1.2)

## 2013-01-02 LAB — FACTOR 5 LEIDEN

## 2013-01-02 LAB — BETA-2-GLYCOPROTEIN I ABS, IGG/M/A
Beta-2 Glyco I IgG: 0 G Units (ref <20)
Beta-2-Glycoprotein I IgA: 0 A Units (ref <20)
Beta-2-Glycoprotein I IgM: 2 M Units (ref <20)

## 2013-01-02 LAB — CBC
MCH: 30.1 pg (ref 26.0–34.0)
MCHC: 35.1 g/dL (ref 30.0–36.0)
MCV: 85.9 fL (ref 78.0–100.0)
Platelets: 211 10*3/uL (ref 150–400)
RDW: 12.5 % (ref 11.5–15.5)

## 2013-01-02 LAB — CARDIOLIPIN ANTIBODIES, IGG, IGM, IGA: Anticardiolipin IgG: 7 GPL U/mL — ABNORMAL LOW (ref <23)

## 2013-01-02 LAB — PROTEIN S ACTIVITY: Protein S Activity: 69 % (ref 69–129)

## 2013-01-02 LAB — LUPUS ANTICOAGULANT PANEL
PTT Lupus Anticoagulant: 67.3 secs — ABNORMAL HIGH (ref 28.0–43.0)
PTTLA Confirmation: 23.2 secs — ABNORMAL HIGH (ref <8.0)

## 2013-01-02 LAB — PROTEIN C ACTIVITY: Protein C Activity: 178 % — ABNORMAL HIGH (ref 75–133)

## 2013-01-02 MED ORDER — BISACODYL 5 MG PO TBEC
10.0000 mg | DELAYED_RELEASE_TABLET | Freq: Once | ORAL | Status: AC
  Administered 2013-01-02: 10 mg via ORAL
  Filled 2013-01-02: qty 2

## 2013-01-02 MED ORDER — HEPARIN BOLUS VIA INFUSION
1500.0000 [IU] | INTRAVENOUS | Status: AC
  Administered 2013-01-02: 1500 [IU] via INTRAVENOUS
  Filled 2013-01-02: qty 1500

## 2013-01-02 MED ORDER — COUMADIN BOOK
Freq: Once | Status: AC
  Administered 2013-01-02: 18:00:00
  Filled 2013-01-02: qty 1

## 2013-01-02 MED ORDER — WARFARIN VIDEO: Freq: Once | Status: DC

## 2013-01-02 MED ORDER — POLYETHYLENE GLYCOL 3350 17 G PO PACK
68.0000 g | PACK | Freq: Once | ORAL | Status: DC
  Filled 2013-01-02: qty 4

## 2013-01-02 MED ORDER — WARFARIN SODIUM 10 MG PO TABS
10.0000 mg | ORAL_TABLET | Freq: Once | ORAL | Status: AC
  Administered 2013-01-02: 10 mg via ORAL
  Filled 2013-01-02: qty 1

## 2013-01-02 MED ORDER — WARFARIN - PHARMACIST DOSING INPATIENT: Freq: Every day | Status: DC

## 2013-01-02 NOTE — Progress Notes (Signed)
ANTICOAGULATION CONSULT NOTE - FOLLOW-UP    HL = 0.47 (goal 0.3 - 0.7 units/mL) Heparin dosing weight = 90 kg    Assessment: 46 YOM with PE to continue on IV heparin bridge while INR is sub-therapeutic on Coumadin.  Heparin level at goal; no bleeding reported.   Plan: - Continue heparin gtt at 2050 units/hr - F/U AM labs     Ayad Nieman D. Laney Potash, PharmD, BCPS Pager:  [phone] 01/02/2013, 7:13 PM

## 2013-01-02 NOTE — Progress Notes (Signed)
ANTICOAGULATION CONSULT NOTE - Follow up  Pharmacy Consult for heparin/  Coumadin Indication: pulmonary embolus  Allergies  Allergen Reactions  . Morphine And Related Other (See Comments)    hallucinations    Patient Measurements: Height: 5\' 9"  (175.3 cm) Weight: 197 lb 12 oz (89.7 kg) IBW/kg (Calculated) : 70.7 Heparin Dosing Weight: 90kg  Vital Signs: Temp: 98.6 F (37 C) (04/29 0440) Temp src: Oral (04/29 0440) BP: 103/73 mmHg (04/29 0440) Pulse Rate: 81 (04/29 0440)  Labs:  Recent Labs  01/01/13 0210  01/01/13 2005 01/02/13 0440 01/02/13 0505 01/02/13 0935  HGB 14.0  --   --   --  12.8*  --   HCT 39.3  --   --   --  36.5*  --   PLT 245  --   --   --  211  --   APTT 34  --   --   --   --   --   LABPROT 13.7  --   --   --   --   --   INR 1.06  --   --   --   --   --   HEPARINUNFRC  --   < > 0.28*  --  0.35 0.27*  CREATININE 1.00  --   --  0.85  --   --   < > = values in this interval not displayed.  Estimated Creatinine Clearance: 120.3 ml/min (by C-G formula based on Cr of 0.85).   Medical History: Past Medical History  Diagnosis Date  . Anxiety     panic attacks  . Ulcer     gastric 1988  . Peptic ulcer 1994    severe requiring vagotomy and pyloroplasty  . Hoarseness of voice     chronic  . Dyslipidemia     "good cholesterol isn't as high as dr would like" (01/01/2013)  . Erectile dysfunction     organic  . Family history of colon cancer   . Lipoma of arm 1994    right  . Constipation   . Diarrhea   . Nasal congestion   . Trouble swallowing   . Palpitations   . Abdominal distension   . Rectal bleeding   . Blood in stool   . Difficulty urinating   . Tubular adenoma of colon 08/2012  . Mitral valve prolapse     "mild" (01/01/2013)  . Anemia   . GERD (gastroesophageal reflux disease)   . Depression   . PONV (postoperative nausea and vomiting)   . Family history of anesthesia complication     "Mom; PONV" (01/01/2013)  . Pulmonary embolism  on right 01/01/2013  . Shortness of breath     "related to the PE today" (01/01/2013)  . Obstructive sleep apnea     miald w aith RDI of 6.4 per hour  . History of blood transfusion 1980's    "related to bleeding ulcers" (01/01/2013)  . Generalized headaches     "monthly" (01/01/2013)  . DDD (degenerative disc disease), thoracolumbar   . DDD (degenerative disc disease), cervical   . DDD (degenerative disc disease), lumbosacral   . Osteoarthritis     "hips & hands" (01/01/2013)  . Chronic back pain     "neck to sacral" (01/01/2013)     Assessment: This is a 47yo male who  had hernia repair one week ago and was discharged on 4/25. He began coughing up blood with clots today with associated sharp pain on R side  to shoulder. CT revealed PE.  Heparin level has dropped to 0.27 on 1850 units/hr IV heparin infusion for +PE.  Slightly less than goal of 0.3-0.7 units/ml. No bleeding reported.   Order to start Coumadin per pharmacy consult today. Baseline INR = 1.06 on 4/28.  H/H 12.8/36.5, pltc 211K  Goal of Therapy:  Heparin level 0.3-0.7 units/ml Monitor platelets by anticoagulation protocol: Yes   Plan:  Give IV heparin bolus of 1500 units IV x 1 then increase IV heparin drip rate to 2050 units/hr.  Check HL in 6 hours then daily HL, CBC.  Give coumadin 10 mg x1 Daily PT/INR   Noah Delaine, RPh Clinical Pharmacist Pager: [phone] 01/02/2013,11:32 AM

## 2013-01-02 NOTE — Care Management Note (Unsigned)
    Page 1 of 1   01/02/2013     3:11:48 PM   CARE MANAGEMENT NOTE 01/02/2013  Patient:  Paul Griffin, Paul Griffin   Account Number:  [id number]  Date Initiated:  01/02/2013  Documentation initiated by:  Archie Shea  Subjective/Objective Assessment:   PT ADM ON 01/01/13 WITH PNA AND PULMONARY EMBOLUS.  PTA, PT INDEPENDENT, LIVES WITH SPOUSE.     Action/Plan:   WILL FOLLOW FOR HOME NEEDS AS PT PROGRESSES.   Anticipated DC Date:  01/04/2013   Anticipated DC Plan:  HOME/SELF CARE      DC Planning Services  CM consult      Choice offered to / List presented to:             Status of service:  In process, will continue to follow Medicare Important Message given?   (If response is "NO", the following Medicare IM given date fields will be blank) Date Medicare IM given:   Date Additional Medicare IM given:    Discharge Disposition:    Per UR Regulation:  Reviewed for med. necessity/level of care/duration of stay  If discussed at Long Length of Stay Meetings, dates discussed:    Comments:

## 2013-01-02 NOTE — Progress Notes (Signed)
ANTICOAGULATION CONSULT NOTE - Follow Up Consult  Pharmacy Consult for heparin Indication: pulmonary embolus  Labs:  Recent Labs  01/01/13 0210 01/01/13 1228 01/01/13 2005 01/02/13 0505  HGB 14.0  --   --  12.8*  HCT 39.3  --   --  36.5*  PLT 245  --   --  211  APTT 34  --   --   --   LABPROT 13.7  --   --   --   INR 1.06  --   --   --   HEPARINUNFRC  --  0.28* 0.28* 0.35  CREATININE 1.00  --   --   --     Assessment/Plan:  47yo male now therapeutic on heparin after rate increases.  Will continue gtt at current rate and confirm stable with additional level.  Vernard Gambles, PharmD, BCPS  01/02/2013,6:11 AM

## 2013-01-02 NOTE — Progress Notes (Signed)
Subjective: He had a small bowel movement yesterday and continues to have cough productive of a small amount of clotted blood, no dyspnea or nausea or vomiting.  Objective: Vital signs in last 24 hours: Temp:  [98.2 F (36.8 C)-98.6 F (37 C)] 98.6 F (37 C) (04/29 0440) Pulse Rate:  [81-83] 81 (04/29 0440) Resp:  [17-18] 18 (04/29 0440) BP: (103-123)/(72-74) 103/73 mmHg (04/29 0440) SpO2:  [95 %-97 %] 97 % (04/29 0440) Weight change:    Intake/Output from previous day: 04/28 0701 - 04/29 0700 In: 1833.1 [P.O.:1680; I.V.:153.1] Out: 750 [Urine:750]   General appearance: alert, cooperative and no distress Resp: minimal right  basilar crackles Cardio: regular rate and rhythm, S1, S2 normal, no murmur, click, rub or gallop GI: soft, non-tender; bowel sounds normal; no masses,  no organomegaly Extremities: extremities normal, atraumatic, no cyanosis or edema  Lab Results:  Recent Labs  01/01/13 0210 01/02/13 0505  WBC 15.6* 10.0  HGB 14.0 12.8*  HCT 39.3 36.5*  PLT 245 211   BMET  Recent Labs  01/01/13 0210 01/02/13 0440  NA 136 134*  K 3.9 3.8  CL 97 98  CO2 27 26  GLUCOSE 109* 107*  BUN 14 16  CREATININE 1.00 0.85  CALCIUM 9.9 9.3   CMET CMP     Component Value Date/Time   NA 134* 01/02/2013 0440   K 3.8 01/02/2013 0440   CL 98 01/02/2013 0440   CO2 26 01/02/2013 0440   GLUCOSE 107* 01/02/2013 0440   BUN 16 01/02/2013 0440   CREATININE 0.85 01/02/2013 0440   CALCIUM 9.3 01/02/2013 0440   PROT 7.2 01/02/2013 0440   ALBUMIN 3.2* 01/02/2013 0440   AST 30 01/02/2013 0440   ALT 39 01/02/2013 0440   ALKPHOS 151* 01/02/2013 0440   BILITOT 0.5 01/02/2013 0440   GFRNONAA >90 01/02/2013 0440   GFRAA >90 01/02/2013 0440    CBG (last 3)  No results found for this basename: GLUCAP,  in the last 72 hours  INR RESULTS:   Lab Results  Component Value Date   INR 1.06 01/01/2013   INR 1.08 08/27/2009     Studies/Results: Ct Angio Chest W/cm &/or Wo Cm  01/01/2013   *RADIOLOGY REPORT*  Clinical Data: Hemoptysis.  Postoperative from abdominal hernia repair on Monday.  Coughing up large blood clot since yesterday. Shortness of breath and right sided flank pain.  CT ANGIOGRAPHY CHEST  Technique:  Multidetector CT imaging of the chest using the standard protocol during bolus administration of intravenous contrast. Multiplanar reconstructed images including MIPs were obtained and reviewed to evaluate the vascular anatomy.  Contrast: OMNIPAQUE IOHEXOL 350 MG/ML SOLN  Comparison: None.  Findings: Technically adequate study with good opacification of the central and segmental pulmonary arteries.  There are filling defects demonstrated in the segmental and subsegmental right lower lobe pulmonary arteries consistent with peripheral pulmonary emboli.  No central pulmonary emboli are demonstrated.  There is infiltration or consolidation in the right lung base near the area of embolus, suggesting pulmonary infarct.  Small bilateral pleural effusions.  Atelectasis is also present on the left lung base.  Normal heart size.  Normal caliber thoracic aorta.  Esophagus is mostly decompressed.  No significant lymphadenopathy in the chest. No pneumothorax.  Visualized portions of the upper abdominal organs are unremarkable.  Surgical clips at the EG junction.  IMPRESSION: Positive study for pulmonary emboli in right lower lobe segmental and subsegmental branches.  Infiltration or atelectasis in both lung bases, possibly  representing infarct on the right.  Small pleural effusions.  Results were discussed by telephone with Dr. Lavella Lemons at 0425 hours on 01/01/2013.   Original Report Authenticated By: Burman Nieves, M.D.    Dg Chest Portable 1 View  01/01/2013  *RADIOLOGY REPORT*  Clinical Data: Hemoptysis.  Shortness of breath.  Recent abdominal surgery.  PORTABLE CHEST - 1 VIEW  Comparison: 12/18/2012  Findings: Shallow inspiration with linear atelectasis in both mid and lower lungs.  This is  new since previous study.  No focal consolidation.  No blunting of costophrenic angles.  No pneumothorax.  Heart size and pulmonary vascularity are normal. The surgical clips in the upper abdomen.  IMPRESSION: Shallow inspiration with linear atelectasis in the mid and lower lungs bilaterally.   Original Report Authenticated By: Burman Nieves, M.D.    Dg Abd 2 Views  01/01/2013  *RADIOLOGY REPORT*  Clinical Data: Constipation, postop hernia repair.  Right-sided pain.  ABDOMEN - 2 VIEW  Comparison: 12/26/2012  Findings: Moderate stool burden persists throughout the colon.  No evidence of bowel obstruction.  No free air, organomegaly or suspicious calcification.  No acute bony abnormality.  Minimal bibasilar opacities, likely atelectasis.  IMPRESSION: No evidence of bowel obstruction or free air.   Original Report Authenticated By: Charlett Nose, M.D.     Medications: I have reviewed the patient's current medications.  Assessment/Plan: #1 Pulmonary embolism: stable on heparin and we will start coumadin tonight. #2 Constipation: moderately severe and improved, will adjust meds today   LOS: 1 day   Aymara Sassi G 01/02/2013, 8:53 AM

## 2013-01-03 LAB — CBC
HCT: 36.3 % — ABNORMAL LOW (ref 39.0–52.0)
MCH: 29.9 pg (ref 26.0–34.0)
MCHC: 35 g/dL (ref 30.0–36.0)
MCV: 85.4 fL (ref 78.0–100.0)
RDW: 12.4 % (ref 11.5–15.5)
WBC: 10.2 10*3/uL (ref 4.0–10.5)

## 2013-01-03 LAB — PROTEIN C, TOTAL: Protein C, Total: 106 % (ref 72–160)

## 2013-01-03 LAB — PROTEIN S, TOTAL: Protein S Ag, Total: 81 % (ref 60–150)

## 2013-01-03 MED ORDER — WARFARIN SODIUM 10 MG PO TABS
10.0000 mg | ORAL_TABLET | Freq: Once | ORAL | Status: DC
  Filled 2013-01-03: qty 1

## 2013-01-03 MED ORDER — MAGNESIUM HYDROXIDE 400 MG/5ML PO SUSP
30.0000 mL | Freq: Once | ORAL | Status: AC
  Administered 2013-01-03: 30 mL via ORAL
  Filled 2013-01-03: qty 30

## 2013-01-03 MED ORDER — HYDROCORTISONE ACETATE 25 MG RE SUPP
25.0000 mg | Freq: Two times a day (BID) | RECTAL | Status: DC
  Administered 2013-01-03 – 2013-01-04 (×2): 25 mg via RECTAL
  Filled 2013-01-03 (×9): qty 1

## 2013-01-03 MED ORDER — POLYETHYLENE GLYCOL 3350 17 G PO PACK
17.0000 g | PACK | Freq: Two times a day (BID) | ORAL | Status: DC
  Administered 2013-01-03: 17 g via ORAL
  Filled 2013-01-03 (×4): qty 1

## 2013-01-03 MED ORDER — WARFARIN SODIUM 7.5 MG PO TABS
7.5000 mg | ORAL_TABLET | Freq: Once | ORAL | Status: AC
  Administered 2013-01-03: 7.5 mg via ORAL
  Filled 2013-01-03: qty 1

## 2013-01-03 NOTE — Progress Notes (Signed)
Subjective: Still having constipation, and had transient mild right groin pain. No longer having hemoptysis.  Objective: Vital signs in last 24 hours: Temp:  [98.4 F (36.9 C)-99.7 F (37.6 C)] 98.4 F (36.9 C) (04/30 0535) Pulse Rate:  [74-98] 74 (04/30 0535) Resp:  [17-18] 17 (04/30 0535) BP: (103-127)/(67-78) 103/76 mmHg (04/30 0535) SpO2:  [97 %] 97 % (04/30 0535) Weight change:    Intake/Output from previous day: 04/29 0701 - 04/30 0700 In: 1678.3 [P.O.:960; I.V.:718.3] Out: 2350 [Urine:2350]   General appearance: alert, cooperative and no distress Resp: clear to auscultation bilaterally Cardio: regular rate and rhythm, S1, S2 normal, no murmur, click, rub or gallop GI: soft, non-tender; bowel sounds normal; no masses,  no organomegaly Extremities: extremities normal, atraumatic, no cyanosis or edema  Lab Results:  Recent Labs  01/02/13 0505 01/03/13 0530  WBC 10.0 10.2  HGB 12.8* 12.7*  HCT 36.5* 36.3*  PLT 211 264   BMET  Recent Labs  2013-01-12 0210 01/02/13 0440  NA 136 134*  K 3.9 3.8  CL 97 98  CO2 27 26  GLUCOSE 109* 107*  BUN 14 16  CREATININE 1.00 0.85  CALCIUM 9.9 9.3   CMET CMP     Component Value Date/Time   NA 134* 01/02/2013 0440   K 3.8 01/02/2013 0440   CL 98 01/02/2013 0440   CO2 26 01/02/2013 0440   GLUCOSE 107* 01/02/2013 0440   BUN 16 01/02/2013 0440   CREATININE 0.85 01/02/2013 0440   CALCIUM 9.3 01/02/2013 0440   PROT 7.2 01/02/2013 0440   ALBUMIN 3.2* 01/02/2013 0440   AST 30 01/02/2013 0440   ALT 39 01/02/2013 0440   ALKPHOS 151* 01/02/2013 0440   BILITOT 0.5 01/02/2013 0440   GFRNONAA >90 01/02/2013 0440   GFRAA >90 01/02/2013 0440    CBG (last 3)  No results found for this basename: GLUCAP,  in the last 72 hours  INR RESULTS:   Lab Results  Component Value Date   INR 1.10 01/03/2013   INR 1.06 01/12/13   INR 1.08 08/27/2009     Studies/Results: Dg Abd 2 Views  01-12-13  *RADIOLOGY REPORT*  Clinical Data:  Constipation, postop hernia repair.  Right-sided pain.  ABDOMEN - 2 VIEW  Comparison: 12/26/2012  Findings: Moderate stool burden persists throughout the colon.  No evidence of bowel obstruction.  No free air, organomegaly or suspicious calcification.  No acute bony abnormality.  Minimal bibasilar opacities, likely atelectasis.  IMPRESSION: No evidence of bowel obstruction or free air.   Original Report Authenticated By: Charlett Nose, M.D.     Medications: I have reviewed the patient's current medications.  Assessment/Plan: #1 Pulmonary Embolism: stable awaiting therapeutic INR. Labs to evaluate a hypercoagulable state are pending. #2 Constipation: moderate and from use of narcotics. Bowel sounds have normalized. Will give soap suds enema today and increase other meds.   LOS: 2 days   Marrio Scribner G 01/03/2013, 8:39 AM

## 2013-01-03 NOTE — Progress Notes (Signed)
Pt c/o hemorrhoids and requesting med for it; MD paged to make aware of request; will await callback.

## 2013-01-03 NOTE — Progress Notes (Signed)
Patient complaining of pain in his right groin area, describing the pain as a dull ache, rating pain 2/10.  Patient did not want pain medication at this time

## 2013-01-03 NOTE — Progress Notes (Signed)
Pt c/o nausea; pt given 4mg IV Zofran at this time; will cont. To monitor. 

## 2013-01-03 NOTE — Progress Notes (Addendum)
ANTICOAGULATION CONSULT NOTE - Follow up  Pharmacy Consult for heparin/  Coumadin Indication: pulmonary embolus  Allergies  Allergen Reactions  . Morphine And Related Other (See Comments)    hallucinations    Patient Measurements: Height: 5\' 9"  (175.3 cm) Weight: 197 lb 12 oz (89.7 kg) IBW/kg (Calculated) : 70.7 Heparin Dosing Weight: 90kg  Vital Signs: Temp: 98.4 F (36.9 C) (04/30 0535) Temp src: Oral (04/30 0535) BP: 103/76 mmHg (04/30 0535) Pulse Rate: 74 (04/30 0535)  Labs:  Recent Labs  01/01/13 0210  01/02/13 0440 01/02/13 0505 01/02/13 0935 01/02/13 1802 01/03/13 0530  HGB 14.0  --   --  12.8*  --   --  12.7*  HCT 39.3  --   --  36.5*  --   --  36.3*  PLT 245  --   --  211  --   --  264  APTT 34  --   --   --   --   --   --   LABPROT 13.7  --   --   --   --   --  14.1  INR 1.06  --   --   --   --   --  1.10  HEPARINUNFRC  --   < >  --  0.35 0.27* 0.47 0.52  CREATININE 1.00  --  0.85  --   --   --   --   < > = values in this interval not displayed.  Estimated Creatinine Clearance: 120.3 ml/min (by C-G formula based on Cr of 0.85).   Medical History: Past Medical History  Diagnosis Date  . Anxiety     panic attacks  . Ulcer     gastric 1988  . Peptic ulcer 1994    severe requiring vagotomy and pyloroplasty  . Hoarseness of voice     chronic  . Dyslipidemia     "good cholesterol isn't as high as dr would like" (01/01/2013)  . Erectile dysfunction     organic  . Family history of colon cancer   . Lipoma of arm 1994    right  . Constipation   . Diarrhea   . Nasal congestion   . Trouble swallowing   . Palpitations   . Abdominal distension   . Rectal bleeding   . Blood in stool   . Difficulty urinating   . Tubular adenoma of colon 08/2012  . Mitral valve prolapse     "mild" (01/01/2013)  . Anemia   . GERD (gastroesophageal reflux disease)   . Depression   . PONV (postoperative nausea and vomiting)   . Family history of anesthesia  complication     "Mom; PONV" (01/01/2013)  . Pulmonary embolism on right 01/01/2013  . Shortness of breath     "related to the PE today" (01/01/2013)  . Obstructive sleep apnea     miald w aith RDI of 6.4 per hour  . History of blood transfusion 1980's    "related to bleeding ulcers" (01/01/2013)  . Generalized headaches     "monthly" (01/01/2013)  . DDD (degenerative disc disease), thoracolumbar   . DDD (degenerative disc disease), cervical   . DDD (degenerative disc disease), lumbosacral   . Osteoarthritis     "hips & hands" (01/01/2013)  . Chronic back pain     "neck to sacral" (01/01/2013)     Assessment: Heparin /coumadin overlap day #2/5 minimum.   Today the heparin level is 0.52, therapeutic on heparin IV drip @  2050 units/hr for + PE in this 47yo male . He is s/p hernia repair 12/25/12 and was discharged on 4/25. On Admitted 4/28 with report of coughing up blood with clots  with associated sharp pain on R side to shoulder. CT revealed PE.  Heparin level currently therapeutic.Coumadin started yesterday. INR = 1.1 today. CBC stable. No further bleeding reported.   Goal of Therapy:  INR = 2-3 Heparin level 0.3-0.7 units/ml Monitor platelets by anticoagulation protocol: Yes   Plan:  Continue IV heparin drip rate at 2050 units/hr.  Give coumadin 10 mg today x1 Daily HL, CBC, INR.    Noah Delaine, RPh Clinical Pharmacist Pager: [phone] 01/03/2013,10:16 AM  Addendum:  Will changed today's coumadin dose to 7.5 mg po x1   Noah Delaine, RPh Clinical Pharmacist Pager: [phone] 01/03/2013, 12:33 PM

## 2013-01-03 NOTE — Progress Notes (Signed)
Pt's wife in RN and is at bedside; pt requesting for wife to administer soap suds enema, but pt wants to rest for a while before taking any medications or the enema; all meds re timed for 1200; will cont. To monitor.

## 2013-01-03 NOTE — Progress Notes (Signed)
Pt c/o chest and back pain and some SOB; sats 97% RA; pt encouraged to use O2 if SOB persists; pt denies need for O2 at this time; pt also denies need for pain meds at this time; wife at bedside; will cont. To monitor.

## 2013-01-04 LAB — CBC
HCT: 36.2 % — ABNORMAL LOW (ref 39.0–52.0)
Hemoglobin: 12.9 g/dL — ABNORMAL LOW (ref 13.0–17.0)
MCHC: 35.6 g/dL (ref 30.0–36.0)
MCV: 86 fL (ref 78.0–100.0)

## 2013-01-04 LAB — PROTIME-INR: INR: 1.75 — ABNORMAL HIGH (ref 0.00–1.49)

## 2013-01-04 LAB — HEPARIN LEVEL (UNFRACTIONATED): Heparin Unfractionated: 0.39 IU/mL (ref 0.30–0.70)

## 2013-01-04 MED ORDER — ONDANSETRON 8 MG/NS 50 ML IVPB
8.0000 mg | Freq: Once | INTRAVENOUS | Status: AC
  Administered 2013-01-04: 8 mg via INTRAVENOUS
  Filled 2013-01-04: qty 8

## 2013-01-04 MED ORDER — PEG 3350-KCL-NABCB-NACL-NASULF 236 G PO SOLR
4000.0000 mL | Freq: Once | ORAL | Status: AC
  Administered 2013-01-04: 4000 mL via ORAL
  Filled 2013-01-04: qty 4000

## 2013-01-04 MED ORDER — WARFARIN SODIUM 4 MG PO TABS
4.0000 mg | ORAL_TABLET | Freq: Once | ORAL | Status: AC
  Administered 2013-01-04: 4 mg via ORAL
  Filled 2013-01-04 (×2): qty 1

## 2013-01-04 MED ORDER — WARFARIN SODIUM 5 MG PO TABS
5.0000 mg | ORAL_TABLET | Freq: Once | ORAL | Status: DC
  Filled 2013-01-04: qty 1

## 2013-01-04 NOTE — Progress Notes (Addendum)
ANTICOAGULATION CONSULT NOTE - Follow up  Pharmacy Consult for heparin/  Coumadin Indication: pulmonary embolus  Allergies  Allergen Reactions  . Morphine And Related Other (See Comments)    hallucinations    Patient Measurements: Height: 5\' 9"  (175.3 cm) Weight: 197 lb 12 oz (89.7 kg) IBW/kg (Calculated) : 70.7 Heparin Dosing Weight: 90kg  Vital Signs: Temp: 98.7 F (37.1 C) (05/01 0510) Temp src: Oral (05/01 0510) BP: 112/74 mmHg (05/01 0510) Pulse Rate: 79 (05/01 0510)  Labs:  Recent Labs  01/02/13 0440  01/02/13 0505  01/02/13 1802 01/03/13 0530 01/04/13 0420  HGB  --   < > 12.8*  --   --  12.7* 12.9*  HCT  --   --  36.5*  --   --  36.3* 36.2*  PLT  --   --  211  --   --  264 311  LABPROT  --   --   --   --   --  14.1 19.8*  INR  --   --   --   --   --  1.10 1.75*  HEPARINUNFRC  --   --  0.35  < > 0.47 0.52 0.39  CREATININE 0.85  --   --   --   --   --   --   < > = values in this interval not displayed.  Estimated Creatinine Clearance: 120.3 ml/min (by C-G formula based on Cr of 0.85).   Medical History: Past Medical History  Diagnosis Date  . Anxiety     panic attacks  . Ulcer     gastric 1988  . Peptic ulcer 1994    severe requiring vagotomy and pyloroplasty  . Hoarseness of voice     chronic  . Dyslipidemia     "good cholesterol isn't as high as dr would like" (01/01/2013)  . Erectile dysfunction     organic  . Family history of colon cancer   . Lipoma of arm 1994    right  . Constipation   . Diarrhea   . Nasal congestion   . Trouble swallowing   . Palpitations   . Abdominal distension   . Rectal bleeding   . Blood in stool   . Difficulty urinating   . Tubular adenoma of colon 08/2012  . Mitral valve prolapse     "mild" (01/01/2013)  . Anemia   . GERD (gastroesophageal reflux disease)   . Depression   . PONV (postoperative nausea and vomiting)   . Family history of anesthesia complication     "Mom; PONV" (01/01/2013)  . Pulmonary  embolism on right 01/01/2013  . Shortness of breath     "related to the PE today" (01/01/2013)  . Obstructive sleep apnea     miald w aith RDI of 6.4 per hour  . History of blood transfusion 1980's    "related to bleeding ulcers" (01/01/2013)  . Generalized headaches     "monthly" (01/01/2013)  . DDD (degenerative disc disease), thoracolumbar   . DDD (degenerative disc disease), cervical   . DDD (degenerative disc disease), lumbosacral   . Osteoarthritis     "hips & hands" (01/01/2013)  . Chronic back pain     "neck to sacral" (01/01/2013)     Assessment: Heparin /coumadin overlap day #3/5 minimum.   Today the INR is 1.75 and the heparin level is 0.39 on heparin IV drip @ 2050 units/hr on coumadin/heparin overlap day #3/5 minimum for + PE in this 47yo male .  H/o s/p hernia repair 12/25/12 and was discharged on 4/25. Admitted on 4/28 with report of coughing up blood with clots and associated sharp pain on R side to shoulder. CT revealed PE.  Heparin level currently therapeutic. INR has increased from 1.1 to 1.75 after 2 days of coumadin.  CBC stable. No further bleeding reported. Will decrease dose today due to  rise in INR today.  Goal of Therapy:  INR = 2-3 Heparin level 0.3-0.7 units/ml Monitor platelets by anticoagulation protocol: Yes   Plan:  Continue IV heparin drip rate at 2050 units/hr.  Give coumadin 4 mg today x 1. Daily HL, CBC, INR.    Noah Delaine, RPh Clinical Pharmacist Pager: [phone] 01/04/2013,9:09 AM

## 2013-01-04 NOTE — Progress Notes (Signed)
Subjective: Had some low back pain and pleuritic chest pain last night, continues to have constipation. Feels OK now  Objective: Vital signs in last 24 hours: Temp:  [97.7 F (36.5 C)-99.2 F (37.3 C)] 98.7 F (37.1 C) (05/01 0510) Pulse Rate:  [72-87] 79 (05/01 0510) Resp:  [17-18] 17 (05/01 0510) BP: (99-112)/(64-78) 112/74 mmHg (05/01 0510) SpO2:  [95 %-100 %] 95 % (05/01 0510) Weight change:    Intake/Output from previous day: 04/30 0701 - 05/01 0700 In: 1678.5 [P.O.:1320; I.V.:238.5] Out: 3120 [Urine:3120]   General appearance: alert, cooperative and no distress Resp: clear to auscultation bilaterally Cardio: regular rate and rhythm, S1, S2 normal, no murmur, click, rub or gallop GI: soft, non-tender; bowel sounds normal; no masses,  no organomegaly Extremities: extremities normal, atraumatic, no cyanosis or edema  Lab Results:  Recent Labs  01/03/13 0530 01/04/13 0420  WBC 10.2 12.0*  HGB 12.7* 12.9*  HCT 36.3* 36.2*  PLT 264 311   BMET  Recent Labs  01/02/13 0440  NA 134*  K 3.8  CL 98  CO2 26  GLUCOSE 107*  BUN 16  CREATININE 0.85  CALCIUM 9.3   CMET CMP     Component Value Date/Time   NA 134* 01/02/2013 0440   K 3.8 01/02/2013 0440   CL 98 01/02/2013 0440   CO2 26 01/02/2013 0440   GLUCOSE 107* 01/02/2013 0440   BUN 16 01/02/2013 0440   CREATININE 0.85 01/02/2013 0440   CALCIUM 9.3 01/02/2013 0440   PROT 7.2 01/02/2013 0440   ALBUMIN 3.2* 01/02/2013 0440   AST 30 01/02/2013 0440   ALT 39 01/02/2013 0440   ALKPHOS 151* 01/02/2013 0440   BILITOT 0.5 01/02/2013 0440   GFRNONAA >90 01/02/2013 0440   GFRAA >90 01/02/2013 0440    CBG (last 3)  No results found for this basename: GLUCAP,  in the last 72 hours  INR RESULTS:   Lab Results  Component Value Date   INR 1.75* 01/04/2013   INR 1.10 01/03/2013   INR 1.06 01/01/2013     Studies/Results: No results found.  Medications: I have reviewed the patient's current  medications.  Assessment/Plan: #1 Pulmonary Embolism: stable on heparin and coumadin, awaiting therapeutic INR level. #2 Constipation: moderate, so will do Moviprep today   LOS: 3 days   Paul Griffin G 01/04/2013, 8:05 AM

## 2013-01-04 NOTE — Progress Notes (Deleted)
ANTICOAGULATION CONSULT NOTE - Follow up  Pharmacy Consult for heparin/  Coumadin Indication: pulmonary embolus  Allergies  Allergen Reactions  . Morphine And Related Other (See Comments)    hallucinations    Patient Measurements: Height: 5\' 9"  (175.3 cm) Weight: 197 lb 12 oz (89.7 kg) IBW/kg (Calculated) : 70.7 Heparin Dosing Weight: 90kg  Vital Signs: Temp: 98.7 F (37.1 C) (05/01 0510) Temp src: Oral (05/01 0510) BP: 112/74 mmHg (05/01 0510) Pulse Rate: 79 (05/01 0510)  Labs:  Recent Labs  01/02/13 0440  01/02/13 0505  01/02/13 1802 01/03/13 0530 01/04/13 0420  HGB  --   < > 12.8*  --   --  12.7* 12.9*  HCT  --   --  36.5*  --   --  36.3* 36.2*  PLT  --   --  211  --   --  264 311  LABPROT  --   --   --   --   --  14.1 19.8*  INR  --   --   --   --   --  1.10 1.75*  HEPARINUNFRC  --   --  0.35  < > 0.47 0.52 0.39  CREATININE 0.85  --   --   --   --   --   --   < > = values in this interval not displayed.  Estimated Creatinine Clearance: 120.3 ml/min (by C-G formula based on Cr of 0.85).   Medical History: Past Medical History  Diagnosis Date  . Anxiety     panic attacks  . Ulcer     gastric 1988  . Peptic ulcer 1994    severe requiring vagotomy and pyloroplasty  . Hoarseness of voice     chronic  . Dyslipidemia     "good cholesterol isn't as high as dr would like" (01/01/2013)  . Erectile dysfunction     organic  . Family history of colon cancer   . Lipoma of arm 1994    right  . Constipation   . Diarrhea   . Nasal congestion   . Trouble swallowing   . Palpitations   . Abdominal distension   . Rectal bleeding   . Blood in stool   . Difficulty urinating   . Tubular adenoma of colon 08/2012  . Mitral valve prolapse     "mild" (01/01/2013)  . Anemia   . GERD (gastroesophageal reflux disease)   . Depression   . PONV (postoperative nausea and vomiting)   . Family history of anesthesia complication     "Mom; PONV" (01/01/2013)  . Pulmonary  embolism on right 01/01/2013  . Shortness of breath     "related to the PE today" (01/01/2013)  . Obstructive sleep apnea     miald w aith RDI of 6.4 per hour  . History of blood transfusion 1980's    "related to bleeding ulcers" (01/01/2013)  . Generalized headaches     "monthly" (01/01/2013)  . DDD (degenerative disc disease), thoracolumbar   . DDD (degenerative disc disease), cervical   . DDD (degenerative disc disease), lumbosacral   . Osteoarthritis     "hips & hands" (01/01/2013)  . Chronic back pain     "neck to sacral" (01/01/2013)     Assessment: Heparin /coumadin overlap day #3/5 minimum.   Today the INR is 1.75 and the heparin level is 0.39 on heparin IV drip @ 2050 units/hr on coumadin/heparin overlap day #3/5 minimum for + PE in this 47yo male .  H/o s/p hernia repair 12/25/12 and was discharged on 4/25. Admitted on 4/28 with report of coughing up blood with clots and associated sharp pain on R side to shoulder. CT revealed PE.  Heparin level currently therapeutic. INR has increased from 1.1 to 1.75 after 2 days of coumadin.  CBC stable. No further bleeding reported. Will decrease dose today due to  rise in INR today.  Goal of Therapy:  INR = 2-3 Heparin level 0.3-0.7 units/ml Monitor platelets by anticoagulation protocol: Yes   Plan:  Continue IV heparin drip rate at 2050 units/hr.  Give coumadin 4 mg today x 1. Daily HL, CBC, INR.    Paul Griffin, RPh Clinical Pharmacist Pager: [phone] 01/04/2013,9:36 AM

## 2013-01-04 NOTE — Progress Notes (Signed)
Pt reports nausea has improved with IV Zofran; will cont. To monitor.

## 2013-01-04 NOTE — Progress Notes (Signed)
Pt c/o nausea; pt given 4mg IV Zofran at this time; will cont. To monitor. 

## 2013-01-05 LAB — CBC
HCT: 35.8 % — ABNORMAL LOW (ref 39.0–52.0)
Hemoglobin: 12.5 g/dL — ABNORMAL LOW (ref 13.0–17.0)
MCH: 29.5 pg (ref 26.0–34.0)
MCHC: 34.9 g/dL (ref 30.0–36.0)
RBC: 4.24 MIL/uL (ref 4.22–5.81)

## 2013-01-05 LAB — PROTIME-INR: Prothrombin Time: 32.2 seconds — ABNORMAL HIGH (ref 11.6–15.2)

## 2013-01-05 NOTE — Progress Notes (Signed)
Subjective: Constipation relieved and nausea has resolved, no dyspnea.  Objective: Vital signs in last 24 hours: Temp:  [98.2 F (36.8 C)-98.5 F (36.9 C)] 98.2 F (36.8 C) (05/02 0521) Pulse Rate:  [79-87] 79 (05/02 0521) Resp:  [18] 18 (05/02 0521) BP: (107-119)/(72-75) 110/72 mmHg (05/02 0521) SpO2:  [98 %-99 %] 98 % (05/02 0521) Weight change:    Intake/Output from previous day: 05/01 0701 - 05/02 0700 In: 1787.1 [P.O.:1200; I.V.:467.1] Out: 1626 [Urine:1625; Stool:1]   General appearance: alert, cooperative and no distress Resp: clear to auscultation bilaterally Cardio: regular rate and rhythm, S1, S2 normal, no murmur, click, rub or gallop GI: soft, non-tender; bowel sounds normal; no masses,  no organomegaly Extremities: extremities normal, atraumatic, no cyanosis or edema  Lab Results:  Recent Labs  01/04/13 0420 01/05/13 0500  WBC 12.0* 10.1  HGB 12.9* 12.5*  HCT 36.2* 35.8*  PLT 311 334   BMET No results found for this basename: NA, K, CL, CO2, GLUCOSE, BUN, CREATININE, CALCIUM,  in the last 72 hours CMET CMP     Component Value Date/Time   NA 134* 01/02/2013 0440   K 3.8 01/02/2013 0440   CL 98 01/02/2013 0440   CO2 26 01/02/2013 0440   GLUCOSE 107* 01/02/2013 0440   BUN 16 01/02/2013 0440   CREATININE 0.85 01/02/2013 0440   CALCIUM 9.3 01/02/2013 0440   PROT 7.2 01/02/2013 0440   ALBUMIN 3.2* 01/02/2013 0440   AST 30 01/02/2013 0440   ALT 39 01/02/2013 0440   ALKPHOS 151* 01/02/2013 0440   BILITOT 0.5 01/02/2013 0440   GFRNONAA >90 01/02/2013 0440   GFRAA >90 01/02/2013 0440    CBG (last 3)  No results found for this basename: GLUCAP,  in the last 72 hours  INR RESULTS:   Lab Results  Component Value Date   INR 3.37* 01/05/2013   INR 1.75* 01/04/2013   INR 1.10 01/03/2013     Studies/Results: No results found.  Medications: I have reviewed the patient's current medications.  Assessment/Plan: #1 Pulmonary Embolism: stable and now INR is  supratherapeutic. Will likely discharge him to home tomorrow after 24 hours of overlap heparin and coumadin at therapeutic range. Will see what coumadin dosing is recommended by the pharmacist.   LOS: 4 days   Paul Griffin 01/05/2013, 8:07 AM

## 2013-01-05 NOTE — Progress Notes (Signed)
ANTICOAGULATION CONSULT NOTE - Follow up  Pharmacy Consult: Heparin / Coumadin (Overlap D#4/5) Indication: pulmonary embolus  Allergies  Allergen Reactions  . Morphine And Related Other (See Comments)    hallucinations    Patient Measurements: Height: 5\' 9"  (175.3 cm) Weight: 197 lb 12 oz (89.7 kg) IBW/kg (Calculated) : 70.7 Heparin Dosing Weight: 90kg  Vital Signs: Temp: 98.2 F (36.8 C) (05/02 0521) Temp src: Oral (05/02 0521) BP: 110/72 mmHg (05/02 0521) Pulse Rate: 79 (05/02 0521)  Labs:  Recent Labs  01/03/13 0530 01/04/13 0420 01/05/13 0500  HGB 12.7* 12.9* 12.5*  HCT 36.3* 36.2* 35.8*  PLT 264 311 334  LABPROT 14.1 19.8* 32.2*  INR 1.10 1.75* 3.37*  HEPARINUNFRC 0.52 0.39 0.36    Estimated Creatinine Clearance: 120.3 ml/min (by C-G formula based on Cr of 0.85).      Assessment:  36 YOM s/p recent hernia repair admitted with chief complaints of coughing up blood with clots and associated sharp pain on right side to shoulder.  CT revealed PE.  Patient was started on IV heparin and Coumadin for PE as well as +lupus anticoagulant.  Heparin level therapeutic and INR increased significantly to supra-therapeutic level today.  Although INR is elevated, heparin is to continue through tomorrow to complete 5 days of overlap (today is day #4/5).  No bleeding per RN.   Goal of Therapy:  INR 2 - 3 Heparin level 0.3-0.7 units/ml Monitor platelets by anticoagulation protocol: Yes     Plan:  - Hold Coumadin today - Continue heparin gtt at 2050 units/hr - Daily HL / CBC - F/U PT / INR in AM to recommend a maintenance dose at discharge (perhaps 5mg  PO daily) - Will need to continue heparin through tomorrow to complete 5 days of overlap requirement       Nicholette Dolson D. Laney Potash, PharmD, BCPS Pager:  [phone] 01/05/2013, 10:53 AM

## 2013-01-06 ENCOUNTER — Inpatient Hospital Stay (HOSPITAL_COMMUNITY): Payer: BC Managed Care – PPO

## 2013-01-06 LAB — CBC
Hemoglobin: 13.2 g/dL (ref 13.0–17.0)
MCH: 30 pg (ref 26.0–34.0)
MCV: 87 fL (ref 78.0–100.0)
Platelets: 367 10*3/uL (ref 150–400)
RBC: 4.4 MIL/uL (ref 4.22–5.81)
WBC: 11.6 10*3/uL — ABNORMAL HIGH (ref 4.0–10.5)

## 2013-01-06 LAB — PROTIME-INR: Prothrombin Time: 33.3 seconds — ABNORMAL HIGH (ref 11.6–15.2)

## 2013-01-06 MED ORDER — WARFARIN SODIUM 5 MG PO TABS
5.0000 mg | ORAL_TABLET | Freq: Once | ORAL | Status: AC
  Administered 2013-01-06: 5 mg via ORAL
  Filled 2013-01-06: qty 1

## 2013-01-06 NOTE — Progress Notes (Signed)
Subjective: Patient with some issues of progressive sternal pain, especially in standing up, or leaning back, with some pleuritic discomfort, over the last 2-3 days. Otherwise no bleeding complications, appetite is somewhat suppressed, and the patient is able to ambulate around the room.  Objective: Vital signs in last 24 hours: Temp:  [98 F (36.7 C)-98.3 F (36.8 C)] 98 F (36.7 C) (05/03 0522) Pulse Rate:  [72-78] 72 (05/03 0522) Resp:  [18] 18 (05/03 0522) BP: (99-102)/(68-72) 99/68 mmHg (05/03 0522) SpO2:  [96 %-97 %] 96 % (05/03 0522) Weight change:  Last BM Date: 01/05/13  CBG (last 3)  No results found for this basename: GLUCAP,  in the last 72 hours  Intake/Output from previous day: 05/02 0701 - 05/03 0700 In: 1672.7 [P.O.:1440; I.V.:232.7] Out: 1400 [Urine:1400] Intake/Output this shift:   Physical exam- No apparent distress answering questions appropriately Oropharyngeal lesions, neck supple Lungs clear to auscultation without any evidence or rhonchi or bronchospasm however unable to fully inhale secondary to discomfort Cardiovascular reveals regular rate and rhythm Abdomen, recent surgical incisions clean dry and intact without evidence of discharge or infection or surrounding erythema, soft, nondistended bowel sounds are present No lower extremity edema clubbing cyanosis   Lab Results: No results found for this basename: NA, K, CL, CO2, GLUCOSE, BUN, CREATININE, CALCIUM, MG, PHOS,  in the last 72 hours No results found for this basename: AST, ALT, ALKPHOS, BILITOT, PROT, ALBUMIN,  in the last 72 hours  Recent Labs  01/05/13 0500 01/06/13 0428  WBC 10.1 11.6*  HGB 12.5* 13.2  HCT 35.8* 38.3*  MCV 84.4 87.0  PLT 334 367   No results found for this basename: CKTOTAL, CKMB, CKMBINDEX, TROPONINI,  in the last 72 hours No results found for this basename: TSH, T4TOTAL, FREET3, T3FREE, THYROIDAB,  in the last 72 hours No results found for this basename:  VITAMINB12, FOLATE, FERRITIN, TIBC, IRON, RETICCTPCT,  in the last 72 hours Labs:   Recent Labs   01/04/13 0420  01/05/13 0500  01/06/13 0428   HGB  12.9*  12.5*  13.2   HCT  36.2*  35.8*  38.3*   PLT  311  334  367   LABPROT  19.8*  32.2*  33.3*   INR  1.75*  3.37*  3.52*   HEPARINUNFRC  0.39  0.36  0.48     Studies/Results: No results found.   Medications: Scheduled: . docusate sodium  200 mg Oral BID  . hydrocortisone  25 mg Rectal BID  . magnesium hydroxide  30 mL Oral Daily  . multivitamin with minerals  1 tablet Oral Daily  . pantoprazole  40 mg Oral Q breakfast  . polyethylene glycol  68 g Oral Once  . sertraline  100 mg Oral Daily  . sodium chloride  3 mL Intravenous Q12H  . warfarin  5 mg Oral ONCE-1800  . warfarin   Does not apply Once  . Warfarin - Pharmacist Dosing Inpatient   Does not apply q1800   Continuous: . heparin 2,050 Units/hr (01/06/13 7829)    Assessment/Plan: Principal Problem:   Pulmonary embolism based on CT angiogram 4/28, as well as a hypercoagulable panel positive for lupus anticoagulant-initiated on Coumadin as well as IV heparin, now status post 5 days of overlap with 2 days of therapeutic overlap, will discontinue IV heparin however given significant elevation in quick elevation and PT/INR and the need to keep therapeutic level, will keep one more day to follow PT/INR poor pharmacy consultation notes,  this is necessary for monitoring for bleeding complications as well as on returning for subtherapeutic anticoagulation in the setting of known hypercoagulable state.  Pleuritic chest pain --unclear etiology I presume this is musculoskeletal secondary to recent abdominal surgery with mesh repair, relative immobility, pleuritic discomfort secondary to PE, will check chest x-ray for stability  Constipation-resolved after intervention by Dr. Jarold Motto      LOS: 5 days   Kynlee Koenigsberg R 01/06/2013, 12:50 PM

## 2013-01-06 NOTE — Progress Notes (Signed)
ANTICOAGULATION CONSULT NOTE - Follow up  Pharmacy Consult: Heparin / Coumadin (Overlap D#5/5) Indication: pulmonary embolus  Allergies  Allergen Reactions  . Morphine And Related Other (See Comments)    hallucinations    Patient Measurements: Height: 5\' 9"  (175.3 cm) Weight: 197 lb 12 oz (89.7 kg) IBW/kg (Calculated) : 70.7 Heparin Dosing Weight: 90kg  Vital Signs: Temp: 98 F (36.7 C) (05/03 0522) Temp src: Oral (05/03 0522) BP: 99/68 mmHg (05/03 0522) Pulse Rate: 72 (05/03 0522)  Labs:  Recent Labs  01/04/13 0420 01/05/13 0500 01/06/13 0428  HGB 12.9* 12.5* 13.2  HCT 36.2* 35.8* 38.3*  PLT 311 334 367  LABPROT 19.8* 32.2* 33.3*  INR 1.75* 3.37* 3.52*  HEPARINUNFRC 0.39 0.36 0.48    Estimated Creatinine Clearance: 120.3 ml/min (by C-G formula based on Cr of 0.85).      Assessment:  Heparin level = 0.48 on 2050 units/hr IV heparin and INR is 3.52 today in this 46 YOM on anticoagulation day#5/5 overlap for new PE.  Hypercoagulable panel on 4/28 showed +lupus anticoagulant.  He is s/p recent hernia repair admitted with chief complaints of coughing up blood with clots and associated sharp pain on right side to shoulder.  CT revealed PE.    Heparin level therapeutic and INR supratherapeutic x 2 days. The rise in the INR is not as significant today as previously seen.(INR trend 1.1-1.75-3.37-3.52). Appears INR rise is tapering off as dose was decreased on 5/1. With dose held yesterday I anticipate INR to decrease further and hopefully be in therapeutic range of 2-3.  Patient appears more sensitive to coumadin than expected for his age. No bleeding including no hemoptysis noted. CBC is stable   Goal of Therapy:  INR 2 - 3 Heparin level 0.3-0.7 units/ml Monitor platelets by anticoagulation protocol: Yes     Plan:  Continue heparin gtt at 2050 units/hr. May discontinue heparin today.  I prefer one more day as inpatient to safely monitor the INR. If patient is to be  discharged today, ideally he needs INR checked tomorrow but at the latest Monday  01/08/13.  It is difficult to determine a discharge dose as INR is not stabilized, remains above goal in patient with + PE and I do not want to dose too low causing INR to fall below goal.  Since INR rise has tapered off, my best estimate is to give 5 mg today and  5mg  tomorrow to keep INR =2-3 and check INR on Mon. 5/5 if not able to check INR tomorrow.   - Daily HL / CBC/INR if remains in hospital.    Noah Delaine, RPh Clinical Pharmacist Pager: [phone] 01/06/2013, 11:07 AM

## 2013-01-07 LAB — CBC
HCT: 38.8 % — ABNORMAL LOW (ref 39.0–52.0)
Platelets: 404 10*3/uL — ABNORMAL HIGH (ref 150–400)
RBC: 4.53 MIL/uL (ref 4.22–5.81)
RDW: 12.6 % (ref 11.5–15.5)
WBC: 15.2 10*3/uL — ABNORMAL HIGH (ref 4.0–10.5)

## 2013-01-07 LAB — PROTIME-INR: INR: 2.68 — ABNORMAL HIGH (ref 0.00–1.49)

## 2013-01-07 LAB — HEPARIN LEVEL (UNFRACTIONATED): Heparin Unfractionated: <0.1 IU/mL — ABNORMAL LOW (ref 0.30–0.70)

## 2013-01-07 MED ORDER — WARFARIN SODIUM 5 MG PO TABS
5.0000 mg | ORAL_TABLET | Freq: Once | ORAL | Status: DC
  Filled 2013-01-07: qty 1

## 2013-01-07 MED ORDER — DSS 100 MG PO CAPS: 200.0000 mg | ORAL_CAPSULE | Freq: Two times a day (BID) | ORAL | Status: DC

## 2013-01-07 MED ORDER — WARFARIN SODIUM 5 MG PO TABS: 5.0000 mg | ORAL_TABLET | Freq: Once | ORAL | Status: DC

## 2013-01-07 NOTE — Progress Notes (Signed)
ANTICOAGULATION CONSULT NOTE - Follow up  Pharmacy Consult:  Coumadin  Indication: pulmonary embolus  Allergies  Allergen Reactions  . Morphine And Related Other (See Comments)    hallucinations    Patient Measurements: Height: 5\' 9"  (175.3 cm) Weight: 197 lb 12 oz (89.7 kg) IBW/kg (Calculated) : 70.7 Heparin Dosing Weight: 90kg  Vital Signs: Temp: 97.9 F (36.6 C) (05/04 0435) Temp src: Oral (05/04 0435) BP: 101/64 mmHg (05/04 0435) Pulse Rate: 85 (05/04 0435)  Labs:  Recent Labs  01/05/13 0500 01/06/13 0428 01/07/13 0532  HGB 12.5* 13.2 13.6  HCT 35.8* 38.3* 38.8*  PLT 334 367 404*  LABPROT 32.2* 33.3* 27.2*  INR 3.37* 3.52* 2.68*  HEPARINUNFRC 0.36 0.48 <0.10*    Estimated Creatinine Clearance: 120.3 ml/min (by C-G formula based on Cr of 0.85).      Assessment:  INR is 2.68, therapeutic today in this 46 YOM on anticoagulation for new PE.  Hypercoagulable panel on 4/28 showed +lupus anticoagulant.  He is s/p recent hernia repair admitted with chief complaints of coughing up blood with clots and associated sharp pain on right side to shoulder.  CT revealed PE.  Appetitie has been somewhat depressed. He had initial quick and significant rise in INR likely due to several factors including low appetite, 10 mg initial dose, and/or more sensitive to coumadin than expected. Would recommend close outpatient INR follow up initially.   No bleeding including no hemoptysis noted. H/H has increased, pltc stable.    Goal of Therapy:  INR 2 - 3 Heparin level 0.3-0.7 units/ml Monitor platelets by anticoagulation protocol: Yes     Plan:  If discharged home today I recommend Coumadin 5 mg po daily with close INR follow up initially.  Recommend 1st outpatient INR either Monday 5/5 or Tues 5/6 then may need recheck on  Fri 5/9 then weekly.  Coumadin 5mg  tonight x1 and daily INR/CBC if remains in hospital.   Noah Delaine, RPh Clinical Pharmacist Pager: [phone] 01/07/2013,  10:25 AM

## 2013-01-07 NOTE — Progress Notes (Signed)
Discharge instructions along with med list and scripts provided. IV d/c'd with catheter intact. Patient transported via wheel chair to main lobby for d/c home. Belongings with patient and family. Paul Griffin

## 2013-01-07 NOTE — Discharge Summary (Signed)
DISCHARGE SUMMARY  Paul Griffin  MR#: [id number]  DOB:1965/10/22  Date of Admission: 01/01/2013 Date of Discharge: 01/07/2013  Attending Physician:[place] Searles R  Patient's WUJ:[id number],GNFAOZ G, MD   Discharge Diagnoses: Principal Problem:   Pulmonary embolism Active Problems:   ANXIETY   Unspecified constipation   Obstructive sleep apnea   Hemoptysis   Discharge Medications:   Medication List    STOP taking these medications       celecoxib 200 MG capsule  Commonly known as:  CELEBREX      TAKE these medications       DSS 100 MG Caps  Take 200 mg by mouth 2 (two) times daily.     HYDROcodone-acetaminophen 5-500 MG per tablet  Commonly known as:  VICODIN  Take 1 tablet by mouth every 6 (six) hours as needed for pain. pain     LORazepam 0.5 MG tablet  Commonly known as:  ATIVAN  Take 0.5 mg by mouth daily as needed for anxiety.     metaxalone 800 MG tablet  Commonly known as:  SKELAXIN  Take 800 mg by mouth 3 (three) times daily as needed for pain.     multivitamin with minerals Tabs  Take 1 tablet by mouth daily.     omeprazole 40 MG capsule  Commonly known as:  PRILOSEC  Take 1 capsule (40 mg total) by mouth daily.     polyethylene glycol packet  Commonly known as:  MIRALAX / GLYCOLAX  Take 17 g by mouth 2 (two) times daily.     sertraline 100 MG tablet  Commonly known as:  ZOLOFT  Take 100 mg by mouth Daily.     warfarin 5 MG tablet  Commonly known as:  COUMADIN  Take 1 tablet (5 mg total) by mouth one time only at 6 PM.        History of Present Illness: The patient is a 47 year old Caucasian man who is generally in very good health. Early this past week he had repair of of a postsurgical abdominal hernia done with mesh. He tolerated the procedure well and was discharged to home 3 days ago. At home he developed increasing dyspnea on exertion with hemoptysis, so he presented to the emergency room for evaluation last night. Workup  showed acute pulmonary embolism with probable pulmonary infarction of the right side. Currently he continues to have moderate right-sided pleuritic chest pain and dyspnea with any activity. He has not had any bowel movements for several days and has decreased appetite. He does not have a history of DVT or pulmonary embolism. His mother had a DVT, however.   Hospital Course: Acute pulmonary embolism diagnosed with CT angiogram-secondary to recent immobilization associated with abdominal surgery in the setting of a hypercoagulable state with positive lupus anticoagulant and with mother's history of DVT. Patient placed on IV heparin with 5 day overlap of Coumadin with therapeutic Coumadin for least 2 days before heparin discontinued. No bleeding complications noted. Anticoagulation-patient initially placed on 10 mg of Coumadin with the development of the supratherapeutic PT/INR, and then held for least one day, and then resumed on 5 mg a day with therapeutic PT/INR on May 4, with recheck in the next one to 2 days planned. Constipation significant with the use of narcotics for abdominal surgery as well as immobilization pulmonary embolism. Patient previously on MiraLAX, did require significant positive or his as well as GoLYTELY use for moving bowels, now stable on Colace with as needed MiraLAX. KUB unremarkable Anxiety stable on  current medications Atypical chest discomfort status post major mesh repair of abdominal hernia, chest x-ray unremarkable, no exertional difficulties, no evidence of volume overload, musculoskeletal in origin given positioning, repeat chest x-ray revealing basilar atelectasis possibly complicated by pulmonary embolism . After extensive discussion with the patient and wife, will use Vicodin in generic form along with muscle relaxants on an outpatient basis.   Day of Discharge Exam BP 101/64  Pulse 85  Temp(Src) 97.9 F (36.6 C) (Oral)  Resp 18  Ht 5\' 9"  (1.753 m)  Wt 89.7 kg  (197 lb 12 oz)  BMI 29.19 kg/m2  SpO2 97%  Physical Exam: No apparent distress answering questions appropriately  Oropharyngeal lesions, neck supple  Lungs clear to auscultation without any evidence or rhonchi or bronchospasm however unable to fully inhale secondary to discomfort  Cardiovascular reveals regular rate and rhythm  Abdomen, recent surgical incisions clean dry and intact without evidence of discharge or infection or surrounding erythema, soft, nondistended bowel sounds are present  No lower extremity edema clubbing cyanosis    Discharge Labs: No results found for this basename: NA, K, CL, CO2, GLUCOSE, BUN, CREATININE, CALCIUM, MG, PHOS,  in the last 72 hours No results found for this basename: AST, ALT, ALKPHOS, BILITOT, PROT, ALBUMIN,  in the last 72 hours  Recent Labs  01/06/13 0428 01/07/13 0532  WBC 11.6* 15.2*  HGB 13.2 13.6  HCT 38.3* 38.8*  MCV 87.0 85.7  PLT 367 404*   PT/INR on May 4 is 2.68 PT/INR on May 3 is 3.52 PT/INR on May 2 is 3.37  Discharge instructions:     Discharge Orders   Future Appointments Provider Department Dept Phone   01/17/2013 3:45 PM Ernestene Mention, MD [facility], [place] [phone]   Future Orders Complete By Expires     Call MD for:  difficulty breathing, headache or visual disturbances  As directed     Call MD for:  persistant nausea and vomiting  As directed     Call MD for:  severe uncontrolled pain  As directed     Call MD for:  temperature >100.4  As directed     Diet - low sodium heart healthy  As directed     Discharge instructions  As directed     Comments:      Call Dr. Silvano Rusk office after  9 AM the day after discharge to obtain appointment time for PT/INR evaluation the next 1-2 days.    Increase activity slowly  As directed        Disposition: Home with wife who has a medical background and RN  Follow-up Appts: Follow-up with Dr. Eloise Harman at [facility] in 1-2 days for  PT/INR evaluation.  Call for appointment.  Condition on Discharge: Stable and improved  Tests Needing Follow-up: PT/INR  Signed: Juwaun Inskeep R 01/07/2013, 12:23 PM

## 2013-01-17 ENCOUNTER — Encounter (INDEPENDENT_AMBULATORY_CARE_PROVIDER_SITE_OTHER): Payer: BC Managed Care – PPO | Admitting: General Surgery

## 2013-01-24 ENCOUNTER — Ambulatory Visit (INDEPENDENT_AMBULATORY_CARE_PROVIDER_SITE_OTHER): Payer: BC Managed Care – PPO | Admitting: General Surgery

## 2013-01-24 ENCOUNTER — Encounter (INDEPENDENT_AMBULATORY_CARE_PROVIDER_SITE_OTHER): Payer: Self-pay | Admitting: General Surgery

## 2013-01-24 VITALS — BP 110/82 | HR 77 | Temp 97.2°F | Ht 71.0 in | Wt 194.8 lb

## 2013-01-24 DIAGNOSIS — K432 Incisional hernia without obstruction or gangrene: Secondary | ICD-10-CM

## 2013-01-24 NOTE — Progress Notes (Signed)
Patient ID: ALISTAIR SENFT, male   DOB: Aug 21, 1966, 47 y.o.   MRN: [id number] History: This patient underwent laparoscopic lysis of adhesions, and laparoscopic repair of multiple ventral incisional hernias with a 20 x 25 cm piece of parietex composite mesh. He did well in the hospital. He was on SCDs and subcutaneous heparin the entire time. He went home. He was readmitted 48 hours later with acute pulmonary embolism. Dr. Ivery Quale took care of him and he is now on Coumadin. He says that he is feeling better. Not really having any chest pain or shortness of breath. No cough. Tolerating diet. Bowel movements normal. He wants to go back to work at [employer] June 2.  Exam: Patient looks well. No distress. Lungs clear to auscultation bilaterally Abdomen: Examined small supine and standing. Hernia repair intact. No significant tenderness. No wound problems. No seroma  Assessment: Complex, multiple incarcerated ventral hernias, no apparent wound problems one month following laparoscopic repair with mesh Postoperative pulmonary embolism, despite pharmacologic prophylaxis. Stable on Coumadin  Plan: Diet and activities were discussed. Daily walk  and stretching exercises discussed Okay to return to work without restriction on June 2 Return to see me in 2 months Dr. Jarold Motto will regulate coumadin   .Angelia Mould. Derrell Lolling, M.D., [facility] General and Minimally invasive Surgery Breast and Colorectal Surgery Office:   [phone] Pager:   [phone]

## 2013-01-24 NOTE — Patient Instructions (Signed)
Your hernia repair is intact and I do not see any evidence of wound problems.  Take a long walk everyday and stretch once or twice a day  You may return to work on June 2 without any restrictions  Return to see Dr. Derrell Lolling in 2 months

## 2013-03-27 ENCOUNTER — Ambulatory Visit (INDEPENDENT_AMBULATORY_CARE_PROVIDER_SITE_OTHER): Payer: BC Managed Care – PPO | Admitting: General Surgery

## 2013-03-27 ENCOUNTER — Encounter (INDEPENDENT_AMBULATORY_CARE_PROVIDER_SITE_OTHER): Payer: Self-pay | Admitting: General Surgery

## 2013-03-27 VITALS — BP 110/78 | HR 72 | Temp 97.0°F | Resp 18 | Ht 71.0 in | Wt 200.5 lb

## 2013-03-27 DIAGNOSIS — K432 Incisional hernia without obstruction or gangrene: Secondary | ICD-10-CM

## 2013-03-27 NOTE — Progress Notes (Signed)
[name], male   DOB: 1965/12/08, 47 y.o.   MRN: [id number] History: This patient underwent laparoscopic lysis of adhesions and laparoscopic repair of multiple ventral incisional hernias with a 20 x 20 cm piece of parietex composite mesh date of surgery 12/25/2012. He was readmitted 48 hours following discharge with acute pulmonary embolism. Dr. Jarome Matin took care of him and he is on Coumadin. He feels fine. He states he has no abdominal pain or incisional tenderness. No problems with the wound. No cough.  Exam: Patient looks well. No distress. Abdomen: Examined supine and standing. Hernia repair intact. No tenderness. No seroma  Assessment: Complex, multiple incarcerated ventral hernias, no apparent wound problems 3 months postop laparoscopic repair of mesh Postoperative pulmonary embolism, despite pharmacologic prophylaxis, stable Coumadin  Plan: Diet and activities discussed. No restriction of activities, but advised to avoid heavy lifting if possible Dr. Eloise Harman  is regulating his Coumadin Return to see me if further problems arise   Angelia Mould. Derrell Lolling, M.D., [facility] General and Minimally invasive Surgery Breast and Colorectal Surgery Office:   [phone] Pager:   [phone]

## 2013-03-27 NOTE — Patient Instructions (Signed)
You had recovered from your laparoscopic ventral hernia repair without any obvious surgical complications.  You may continue normal activities without restriction. Avoid heavy lifting, if possible.  Return to see Dr. Derrell Lolling if further problems arise.

## 2013-05-29 ENCOUNTER — Encounter (HOSPITAL_COMMUNITY): Payer: Self-pay | Admitting: Emergency Medicine

## 2013-05-29 ENCOUNTER — Emergency Department (HOSPITAL_COMMUNITY): Payer: Worker's Compensation

## 2013-05-29 ENCOUNTER — Emergency Department (HOSPITAL_COMMUNITY)
Admission: EM | Admit: 2013-05-29 | Discharge: 2013-05-29 | Disposition: A | Discharge status: Home / Self Care | Payer: Worker's Compensation | Attending: Emergency Medicine | Admitting: Emergency Medicine

## 2013-05-29 DIAGNOSIS — F329 Major depressive disorder, single episode, unspecified: Secondary | ICD-10-CM | POA: Insufficient documentation

## 2013-05-29 DIAGNOSIS — S0100XA Unspecified open wound of scalp, initial encounter: Secondary | ICD-10-CM | POA: Insufficient documentation

## 2013-05-29 DIAGNOSIS — S0101XA Laceration without foreign body of scalp, initial encounter: Secondary | ICD-10-CM

## 2013-05-29 DIAGNOSIS — M549 Dorsalgia, unspecified: Secondary | ICD-10-CM | POA: Insufficient documentation

## 2013-05-29 DIAGNOSIS — Y990 Civilian activity done for income or pay: Secondary | ICD-10-CM | POA: Insufficient documentation

## 2013-05-29 DIAGNOSIS — Z860100 Personal history of colon polyps, unspecified: Secondary | ICD-10-CM | POA: Insufficient documentation

## 2013-05-29 DIAGNOSIS — Z79899 Other long term (current) drug therapy: Secondary | ICD-10-CM | POA: Insufficient documentation

## 2013-05-29 DIAGNOSIS — Z8669 Personal history of other diseases of the nervous system and sense organs: Secondary | ICD-10-CM | POA: Insufficient documentation

## 2013-05-29 DIAGNOSIS — Z85038 Personal history of other malignant neoplasm of large intestine: Secondary | ICD-10-CM | POA: Insufficient documentation

## 2013-05-29 DIAGNOSIS — G8929 Other chronic pain: Secondary | ICD-10-CM | POA: Insufficient documentation

## 2013-05-29 DIAGNOSIS — Z86711 Personal history of pulmonary embolism: Secondary | ICD-10-CM | POA: Insufficient documentation

## 2013-05-29 DIAGNOSIS — K5900 Constipation, unspecified: Secondary | ICD-10-CM | POA: Insufficient documentation

## 2013-05-29 DIAGNOSIS — K219 Gastro-esophageal reflux disease without esophagitis: Secondary | ICD-10-CM | POA: Insufficient documentation

## 2013-05-29 DIAGNOSIS — Y9289 Other specified places as the place of occurrence of the external cause: Secondary | ICD-10-CM | POA: Insufficient documentation

## 2013-05-29 DIAGNOSIS — W1809XA Striking against other object with subsequent fall, initial encounter: Secondary | ICD-10-CM | POA: Insufficient documentation

## 2013-05-29 DIAGNOSIS — M1990 Unspecified osteoarthritis, unspecified site: Secondary | ICD-10-CM | POA: Insufficient documentation

## 2013-05-29 DIAGNOSIS — F411 Generalized anxiety disorder: Secondary | ICD-10-CM | POA: Insufficient documentation

## 2013-05-29 DIAGNOSIS — Z7901 Long term (current) use of anticoagulants: Secondary | ICD-10-CM | POA: Insufficient documentation

## 2013-05-29 DIAGNOSIS — Y9339 Activity, other involving climbing, rappelling and jumping off: Secondary | ICD-10-CM | POA: Insufficient documentation

## 2013-05-29 DIAGNOSIS — S060X0A Concussion without loss of consciousness, initial encounter: Secondary | ICD-10-CM

## 2013-05-29 DIAGNOSIS — F3289 Other specified depressive episodes: Secondary | ICD-10-CM | POA: Insufficient documentation

## 2013-05-29 DIAGNOSIS — Z8711 Personal history of peptic ulcer disease: Secondary | ICD-10-CM | POA: Insufficient documentation

## 2013-05-29 DIAGNOSIS — Z8679 Personal history of other diseases of the circulatory system: Secondary | ICD-10-CM | POA: Insufficient documentation

## 2013-05-29 DIAGNOSIS — Z862 Personal history of diseases of the blood and blood-forming organs and certain disorders involving the immune mechanism: Secondary | ICD-10-CM | POA: Insufficient documentation

## 2013-05-29 DIAGNOSIS — Z8639 Personal history of other endocrine, nutritional and metabolic disease: Secondary | ICD-10-CM | POA: Insufficient documentation

## 2013-05-29 DIAGNOSIS — Z872 Personal history of diseases of the skin and subcutaneous tissue: Secondary | ICD-10-CM | POA: Insufficient documentation

## 2013-05-29 LAB — PROTIME-INR
INR: 1.86 — ABNORMAL HIGH (ref 0.00–1.49)
Prothrombin Time: 20.9 seconds — ABNORMAL HIGH (ref 11.6–15.2)

## 2013-05-29 MED ORDER — PROMETHAZINE HCL 25 MG PO TABS
25.0000 mg | ORAL_TABLET | Freq: Once | ORAL | Status: AC
  Administered 2013-05-29: 25 mg via ORAL
  Filled 2013-05-29: qty 1

## 2013-05-29 MED ORDER — DIPHENHYDRAMINE HCL 25 MG PO CAPS
50.0000 mg | ORAL_CAPSULE | Freq: Once | ORAL | Status: AC
  Administered 2013-05-29: 50 mg via ORAL
  Filled 2013-05-29: qty 2

## 2013-05-29 MED ORDER — PREDNISONE 20 MG PO TABS
60.0000 mg | ORAL_TABLET | Freq: Once | ORAL | Status: AC
  Administered 2013-05-29: 60 mg via ORAL
  Filled 2013-05-29: qty 3

## 2013-05-29 NOTE — ED Provider Notes (Addendum)
CSN: [id number]     Arrival date & time 05/29/13  1758 History   First MD Initiated Contact with Patient 05/29/13 2034     Chief Complaint  Patient presents with  . Headache   HPI  History provided by the patient and wife. Patient is a 47 year old male with history of PE currently on Coumadin, cervical spine surgeries with chronic pain who presents with concerns for severe headache after head injury yesterday. 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 He had a small laceration to the top of his scalp. When he returned home there was still some continued oozing and bleeding. His wound was cleaned and his wife who is an Training and development officer and [employer]. Since that time patient has complained of continued severe diffuse headache. Today headache has also caused pressure and pain behind both eyes. Pain is worse with bright lights and activities. There has been no associated weakness or numbness of extremities. No dizziness or vertigo symptoms. He denies any vision change or loss. He denies any speech changes. Patient has no prior history of significant head injuries or concussions. No other aggravating or alleviating factors. No other associated symptoms. Patient has not taken any additional medications for his headache pains.    Past Medical History  Diagnosis Date  . Anxiety     panic attacks  . Ulcer     gastric 1988  . Peptic ulcer 1994    severe requiring vagotomy and pyloroplasty  . Hoarseness of voice     chronic  . Dyslipidemia     "good cholesterol isn't as high as dr would like" (01/01/2013)  . Erectile dysfunction     organic  . Family history of colon cancer   . Lipoma of arm 1994    right  . Constipation   . Diarrhea   . Nasal congestion   . Trouble swallowing   . Palpitations   . Abdominal distension   . Rectal bleeding   . Blood in stool   . Difficulty urinating    . Tubular adenoma of colon 08/2012  . Mitral valve prolapse     "mild" (01/01/2013)  . Anemia   . GERD (gastroesophageal reflux disease)   . Depression   . PONV (postoperative nausea and vomiting)   . Family history of anesthesia complication     "Mom; PONV" (01/01/2013)  . Pulmonary embolism on right 01/01/2013  . Shortness of breath     "related to the PE today" (01/01/2013)  . Obstructive sleep apnea     miald w aith RDI of 6.4 per hour  . History of blood transfusion 1980's    "related to bleeding ulcers" (01/01/2013)  . Generalized headaches     "monthly" (01/01/2013)  . DDD (degenerative disc disease), thoracolumbar   . DDD (degenerative disc disease), cervical   . DDD (degenerative disc disease), lumbosacral   . Osteoarthritis     "hips & hands" (01/01/2013)  . Chronic back pain     "neck to sacral" (01/01/2013)   Past Surgical History  Procedure Laterality Date  . Vagotomy and pyloroplasty  1988    "partial; for ulcers" (01/01/2013)  . Colonoscopy  07/2007    showed polyps, follow up in 2013  . Tonsillectomy  2008  . Posterior cervical fusion/foraminotomy  2012    fusion  . Incisional hernia repair N/A 12/25/2012  Procedure: LAPAROSCOPIC REPAIR MULITPLE INCISIONAL HERNIA WITH MESH;  Surgeon: Ernestene Mention, MD;  Location: [facility] OR;  Service: General;  Laterality: N/A;  . Insertion of mesh N/A 12/25/2012    Procedure: INSERTION OF MESH;  Surgeon: Ernestene Mention, MD;  Location: [facility] OR;  Service: General;  Laterality: N/A;  . Excisional hemorrhoidectomy      "w/colonoscopies" (01/01/2013)  . Hernia repair  12/25/12    multi inc hernia   Family History  Problem Relation Age of Onset  . Colon cancer Mother   . Lymphoma Mother     non- hodgkins  . COPD Mother   . Melanoma Mother   . Ovarian cancer Sister   . Ulcerative colitis Sister   . Kidney cancer Father   . Heart disease Father   . Cancer Sister     brain  . Breast cancer Sister    History  Substance Use  Topics  . Smoking status: Never Smoker   . Smokeless tobacco: Never Used  . Alcohol Use: Yes     Comment: 01/01/2013 "maybe 1-2 beers/month"    Review of Systems  Constitutional: Negative for fever, chills, diaphoresis and fatigue.  Eyes: Positive for photophobia and pain. Negative for visual disturbance.  Gastrointestinal: Negative for nausea and vomiting.  Neurological: Positive for headaches. Negative for dizziness, weakness, light-headedness and numbness.  All other systems reviewed and are negative.    Allergies  Morphine and related  Home Medications   Current Outpatient Rx  Name  Route  Sig  Dispense  Refill  . docusate sodium 100 MG CAPS   Oral   Take 200 mg by mouth 2 (two) times daily.   60 capsule   0   . HYDROcodone-acetaminophen (VICODIN) 5-500 MG per tablet   Oral   Take 1 tablet by mouth every 6 (six) hours as needed for pain. pain         . LORazepam (ATIVAN) 0.5 MG tablet   Oral   Take 0.5 mg by mouth daily as needed for anxiety.          . metaxalone (SKELAXIN) 800 MG tablet   Oral   Take 800 mg by mouth 3 (three) times daily as needed for pain.          . Multiple Vitamin (MULTIVITAMIN WITH MINERALS) TABS   Oral   Take 1 tablet by mouth daily.         [name] omeprazole (PRILOSEC) 40 MG capsule   Oral   Take 1 capsule (40 mg total) by mouth daily.   30 capsule   11   . sertraline (ZOLOFT) 100 MG tablet   Oral   Take 100 mg by mouth Daily.          [name] warfarin (COUMADIN) 2.5 MG tablet   Oral   Take 2.5-5 mg by mouth daily.           BP 96/59  Pulse 85  Temp(Src) 98.2 F (36.8 C) (Oral)  Resp 18  SpO2 99% Physical Exam  Nursing note and vitals reviewed. Constitutional: He is oriented to person, place, and time. He appears well-developed and well-nourished. No distress.  HENT:  Head: Normocephalic.  No sinus pressure or nasal congestion. Small 1 cm laceration to the top of the scalp closed with Dermabond and Steri-Strips. No  significant swelling or hematoma. No bleeding or drainage. No erythema skin.  Eyes: Conjunctivae and EOM are normal. Pupils are equal, round, and reactive to light.  Neck:  Normal range of motion. Neck supple.  No meningeal sign  Cardiovascular: Normal rate and regular rhythm.   Pulmonary/Chest: Effort normal and breath sounds normal. No respiratory distress. He has no wheezes.  Abdominal: Soft. There is no tenderness.  Musculoskeletal: Normal range of motion. He exhibits no edema and no tenderness.  Neurological: He is alert and oriented to person, place, and time. He has normal strength. No cranial nerve deficit or sensory deficit. Coordination and gait normal.  Skin: Skin is warm. No rash noted.  Psychiatric: He has a normal mood and affect. His behavior is normal.    ED Course  Procedures  Labs Review Labs Reviewed  PROTIME-INR - Abnormal; Notable for the following:    Prothrombin Time 20.9 (*)    INR 1.86 (*)    All other components within normal limits   Imaging Review Ct Head Wo Contrast  05/29/2013   CLINICAL DATA:  Head injury.  Laceration head vertex with headache.  EXAM: CT HEAD WITHOUT CONTRAST  TECHNIQUE: Contiguous axial images were obtained from the base of the skull through the vertex without intravenous contrast.  COMPARISON:  None.  FINDINGS: There is no evidence of acute intracranial hemorrhage, mass lesion, brain edema or extra-axial fluid collection. The ventricles and subarachnoid spaces are appropriately sized for age. There is no CT evidence of acute cortical infarction. Peripherally calcified cysts of the choroid plexus are noted in the atria of both lateral ventricles.  Mild ethmoid sinus and maxillary sinus mucosal thickening is noted without air-fluid levels. The middle ears and mastoid air cells are clear. The calvarium is intact.  IMPRESSION: No acute intracranial or calvarial findings. Ethmoid and maxillary sinus mucosal thickening.   Electronically Signed   By:  Roxy Horseman   On: 05/29/2013 19:29    MDM   1. Concussion, without loss of consciousness, initial encounter   2. Scalp laceration, initial encounter       8:35 PM patient seen and evaluated. Patient well-appearing in no acute distress. Does appear slightly uncomfortable.  Scalp laceration is already repaired without need of additional treatment at this time. CT scan does not show any acute or concerning findings. Patient has normal nonfocal neuro exam. Medications order to treat his headache symptoms. He has refused any injections or IV medications. Will plan to provide information on concussions and have patient continue to followup with PCP and occupational health.    Angus Seller, PA-C 05/29/13 2137  Angus Seller, PA-C 06/11/13 2002

## 2013-05-29 NOTE — ED Notes (Signed)
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

## 2013-05-30 NOTE — ED Provider Notes (Signed)
Medical screening examination/treatment/procedure(s) were performed by non-physician practitioner and as supervising physician I was immediately available for consultation/collaboration.  Derwood Kaplan, MD 05/30/13 0236

## 2013-06-12 NOTE — ED Provider Notes (Signed)
Medical screening examination/treatment/procedure(s) were performed by non-physician practitioner and as supervising physician I was immediately available for consultation/collaboration.  Maegan Buller, MD 06/12/13 0715 

## 2013-07-31 ENCOUNTER — Other Ambulatory Visit: Payer: Self-pay | Admitting: Internal Medicine

## 2013-07-31 DIAGNOSIS — R0789 Other chest pain: Secondary | ICD-10-CM

## 2013-07-31 DIAGNOSIS — I2699 Other pulmonary embolism without acute cor pulmonale: Secondary | ICD-10-CM

## 2013-07-31 DIAGNOSIS — R0609 Other forms of dyspnea: Secondary | ICD-10-CM

## 2013-07-31 DIAGNOSIS — R0989 Other specified symptoms and signs involving the circulatory and respiratory systems: Secondary | ICD-10-CM

## 2013-08-01 ENCOUNTER — Ambulatory Visit
Admission: RE | Admit: 2013-08-01 | Discharge: 2013-08-01 | Disposition: A | Discharge status: Home / Self Care | Payer: BC Managed Care – PPO | Source: Ambulatory Visit | Attending: Internal Medicine | Admitting: Internal Medicine

## 2013-08-01 DIAGNOSIS — R0789 Other chest pain: Secondary | ICD-10-CM

## 2013-08-01 DIAGNOSIS — R0609 Other forms of dyspnea: Secondary | ICD-10-CM

## 2013-08-01 DIAGNOSIS — I2699 Other pulmonary embolism without acute cor pulmonale: Secondary | ICD-10-CM

## 2013-08-01 MED ORDER — IOHEXOL 350 MG/ML SOLN
100.0000 mL | Freq: Once | INTRAVENOUS | Status: AC | PRN
  Administered 2013-08-01: 100 mL via INTRAVENOUS

## 2013-08-16 ENCOUNTER — Other Ambulatory Visit: Payer: Self-pay | Admitting: Internal Medicine

## 2013-08-16 DIAGNOSIS — E041 Nontoxic single thyroid nodule: Secondary | ICD-10-CM

## 2013-08-20 ENCOUNTER — Ambulatory Visit
Admission: RE | Admit: 2013-08-20 | Discharge: 2013-08-20 | Disposition: A | Discharge status: Home / Self Care | Payer: BC Managed Care – PPO | Source: Ambulatory Visit | Attending: Internal Medicine | Admitting: Internal Medicine

## 2013-08-20 ENCOUNTER — Other Ambulatory Visit: Payer: Self-pay | Admitting: Internal Medicine

## 2013-08-20 DIAGNOSIS — R52 Pain, unspecified: Secondary | ICD-10-CM

## 2013-08-20 DIAGNOSIS — E041 Nontoxic single thyroid nodule: Secondary | ICD-10-CM

## 2013-08-27 ENCOUNTER — Other Ambulatory Visit: Payer: Self-pay | Admitting: Internal Medicine

## 2013-08-27 DIAGNOSIS — E041 Nontoxic single thyroid nodule: Secondary | ICD-10-CM

## 2013-09-04 ENCOUNTER — Ambulatory Visit (HOSPITAL_COMMUNITY)
Admission: RE | Admit: 2013-09-04 | Discharge: 2013-09-04 | Disposition: A | Discharge status: Home / Self Care | Payer: BC Managed Care – PPO | Source: Ambulatory Visit | Attending: Internal Medicine | Admitting: Internal Medicine

## 2013-09-04 ENCOUNTER — Other Ambulatory Visit (HOSPITAL_COMMUNITY): Payer: Self-pay | Admitting: Internal Medicine

## 2013-09-04 DIAGNOSIS — M545 Low back pain, unspecified: Secondary | ICD-10-CM | POA: Insufficient documentation

## 2013-09-04 DIAGNOSIS — R52 Pain, unspecified: Secondary | ICD-10-CM

## 2013-09-04 DIAGNOSIS — M5137 Other intervertebral disc degeneration, lumbosacral region: Secondary | ICD-10-CM | POA: Insufficient documentation

## 2013-09-04 DIAGNOSIS — M51379 Other intervertebral disc degeneration, lumbosacral region without mention of lumbar back pain or lower extremity pain: Secondary | ICD-10-CM | POA: Insufficient documentation

## 2013-09-04 DIAGNOSIS — E041 Nontoxic single thyroid nodule: Secondary | ICD-10-CM | POA: Insufficient documentation

## 2013-09-04 DIAGNOSIS — M25559 Pain in unspecified hip: Secondary | ICD-10-CM | POA: Insufficient documentation

## 2013-09-04 NOTE — Procedures (Signed)
US guided Rt thyroid nodule biopsy  25 g x 4 Sent to cyto  Pt tolerated well

## 2013-10-12 ENCOUNTER — Encounter (HOSPITAL_COMMUNITY): Payer: Self-pay | Admitting: Pharmacy Technician

## 2013-10-15 ENCOUNTER — Ambulatory Visit (HOSPITAL_COMMUNITY)
Admission: RE | Admit: 2013-10-15 | Discharge: 2013-10-15 | Disposition: A | Discharge status: Home / Self Care | Payer: BC Managed Care – PPO | Source: Ambulatory Visit | Attending: Cardiology | Admitting: Cardiology

## 2013-10-15 ENCOUNTER — Encounter (HOSPITAL_COMMUNITY)
Admission: RE | Disposition: A | Discharge status: Home / Self Care | Payer: Self-pay | Source: Ambulatory Visit | Attending: Cardiology

## 2013-10-15 DIAGNOSIS — Z86711 Personal history of pulmonary embolism: Secondary | ICD-10-CM | POA: Insufficient documentation

## 2013-10-15 DIAGNOSIS — F329 Major depressive disorder, single episode, unspecified: Secondary | ICD-10-CM | POA: Insufficient documentation

## 2013-10-15 DIAGNOSIS — R0989 Other specified symptoms and signs involving the circulatory and respiratory systems: Secondary | ICD-10-CM | POA: Insufficient documentation

## 2013-10-15 DIAGNOSIS — R0609 Other forms of dyspnea: Secondary | ICD-10-CM | POA: Insufficient documentation

## 2013-10-15 DIAGNOSIS — I2510 Atherosclerotic heart disease of native coronary artery without angina pectoris: Secondary | ICD-10-CM | POA: Insufficient documentation

## 2013-10-15 DIAGNOSIS — F3289 Other specified depressive episodes: Secondary | ICD-10-CM | POA: Insufficient documentation

## 2013-10-15 DIAGNOSIS — F411 Generalized anxiety disorder: Secondary | ICD-10-CM | POA: Insufficient documentation

## 2013-10-15 HISTORY — PX: LEFT HEART CATHETERIZATION WITH CORONARY ANGIOGRAM: SHX5451

## 2013-10-15 LAB — BASIC METABOLIC PANEL
BUN: 27 mg/dL — AB (ref 6–23)
CALCIUM: 8.7 mg/dL (ref 8.4–10.5)
CO2: 25 mEq/L (ref 19–32)
Chloride: 106 mEq/L (ref 96–112)
Creatinine, Ser: 1.05 mg/dL (ref 0.50–1.35)
GFR, EST NON AFRICAN AMERICAN: 83 mL/min — AB (ref >90)
Glucose, Bld: 95 mg/dL (ref 70–99)
POTASSIUM: 3.8 meq/L (ref 3.7–5.3)
SODIUM: 144 meq/L (ref 137–147)

## 2013-10-15 LAB — PROTIME-INR
INR: 0.96 (ref 0.00–1.49)
PROTHROMBIN TIME: 12.6 s (ref 11.6–15.2)

## 2013-10-15 LAB — CBC
HCT: 41.5 % (ref 39.0–52.0)
HEMOGLOBIN: 14.2 g/dL (ref 13.0–17.0)
MCH: 30.9 pg (ref 26.0–34.0)
MCHC: 34.2 g/dL (ref 30.0–36.0)
MCV: 90.2 fL (ref 78.0–100.0)
PLATELETS: 215 10*3/uL (ref 150–400)
RBC: 4.6 MIL/uL (ref 4.22–5.81)
RDW: 13.2 % (ref 11.5–15.5)
WBC: 8.2 10*3/uL (ref 4.0–10.5)

## 2013-10-15 SURGERY — LEFT HEART CATHETERIZATION WITH CORONARY ANGIOGRAM
Anesthesia: LOCAL

## 2013-10-15 MED ORDER — NITROGLYCERIN 0.2 MG/ML ON CALL CATH LAB
INTRAVENOUS | Status: AC
  Filled 2013-10-15: qty 1

## 2013-10-15 MED ORDER — SODIUM CHLORIDE 0.9 % IV BOLUS (SEPSIS)
500.0000 mL | Freq: Once | INTRAVENOUS | Status: AC
  Administered 2013-10-15: 06:00:00 via INTRAVENOUS

## 2013-10-15 MED ORDER — ACETAMINOPHEN 325 MG PO TABS: 650.0000 mg | ORAL_TABLET | ORAL | Status: DC | PRN

## 2013-10-15 MED ORDER — ASPIRIN 81 MG PO CHEW
81.0000 mg | CHEWABLE_TABLET | ORAL | Status: AC
  Administered 2013-10-15: 81 mg via ORAL
  Filled 2013-10-15: qty 1

## 2013-10-15 MED ORDER — HEPARIN (PORCINE) IN NACL 2-0.9 UNIT/ML-% IJ SOLN
INTRAMUSCULAR | Status: AC
  Filled 2013-10-15: qty 1000

## 2013-10-15 MED ORDER — VERAPAMIL HCL 2.5 MG/ML IV SOLN
INTRAVENOUS | Status: AC
  Filled 2013-10-15: qty 2

## 2013-10-15 MED ORDER — ONDANSETRON HCL 4 MG/2ML IJ SOLN: 4.0000 mg | Freq: Four times a day (QID) | INTRAMUSCULAR | Status: DC | PRN

## 2013-10-15 MED ORDER — SODIUM CHLORIDE 0.9 % IV SOLN: 1.0000 mL/kg/h | INTRAVENOUS | Status: DC

## 2013-10-15 MED ORDER — SODIUM CHLORIDE 0.9 % IJ SOLN: 3.0000 mL | INTRAMUSCULAR | Status: DC | PRN

## 2013-10-15 MED ORDER — SODIUM CHLORIDE 0.9 % IV SOLN: 250.0000 mL | INTRAVENOUS | Status: DC | PRN

## 2013-10-15 MED ORDER — LIDOCAINE HCL (PF) 1 % IJ SOLN
INTRAMUSCULAR | Status: AC
  Filled 2013-10-15: qty 30

## 2013-10-15 MED ORDER — HYDROMORPHONE HCL PF 1 MG/ML IJ SOLN
INTRAMUSCULAR | Status: AC
  Filled 2013-10-15: qty 1

## 2013-10-15 MED ORDER — SODIUM CHLORIDE 0.9 % IV SOLN: INTRAVENOUS | Status: DC

## 2013-10-15 MED ORDER — MIDAZOLAM HCL 2 MG/2ML IJ SOLN
INTRAMUSCULAR | Status: AC
  Filled 2013-10-15: qty 2

## 2013-10-15 MED ORDER — SODIUM CHLORIDE 0.9 % IJ SOLN
3.0000 mL | Freq: Two times a day (BID) | INTRAMUSCULAR | Status: DC
  Administered 2013-10-15: 3 mL via INTRAVENOUS

## 2013-10-15 NOTE — H&P (Signed)
  Please see office visit notes for complete details of HPI.  

## 2013-10-15 NOTE — Discharge Instructions (Signed)

## 2013-10-15 NOTE — CV Procedure (Signed)
Procedure performed:  Left heart catheterization including hemodynamic monitoring of the left ventricle, LV gram, selective right and left coronary arteriography.  Indication patient is a 48 year-old male with history of anxiety, depression, dyspnea and chest pain, h/o PE in the remote past. Patient has  had non invasive testing which was abnormal, revealing LV dilatation and EF 37%. Due to ongoing symptoms of chest pain brought to the cardiac catheterization lab to evaluate the  coronary anatomy for definitive diagnosis of CAD.  Hemodynamic data:  Left ventricular pressure was 90/5 with LVEDP of 8 mm mercury. Aortic pressure was 90/66 with a mean of 79 mm mercury. There was no pressure gradient across the aortic valve  Left ventricle: Performed in the RAO projection revealed LVEF of 50-55%. There was No MR. No wall motion abnormality.  Right coronary artery: The vessel is smooth, normal, Dominant. There is slow flow evident in the RCA. This may suggest endothelial dysfunction.  Left main coronary artery is large and normal.  Circumflex coronary artery: A large vessel giving origin to a large obtuse marginal 1. It is smooth and normal except in the ostium, has a mild 10- 20% smooth stenosis.    LAD:  LAD gives origin to a large diagonal-1.  LAD has minimal smooth luminal irregularities in the mid segment constituting 5-10%. The ostium of the D1 has about a 10% smooth stenosis.  Ramus intermediate: Large vessel, smooth and normal.   Impression: Normal left ventricle systolic function, EF 79-89%. Slow flow evident in the RCA suggest endothelial dysfunction. Ostium of the circumflex, midsegment of the LAD, ostial D1 has a smooth and percent stenosis, may suggest soft plaque burden. No high-grade stenosis is evident. Recommend aggressive medical therapy.  Technique: Under sterile precautions using a 6 French right radial  arterial access, a 6 French sheath was introduced into the right radial  artery. A 5 [place] Tig 4 catheter was advanced into the ascending aorta selective  right coronary artery and left coronary artery was cannulated and angiography was performed in multiple views. The catheter was pulled back Out of the body over exchange length J-wire. Same Catheter was used to perform LV gram which was performed in RAO projection. Catheter exchanged out of the body over J-Wire. NO immediate complications noted. Patient tolerated the procedure well. A total of 55 cc of contrast was utilized for diagnostic angiography.

## 2013-11-05 ENCOUNTER — Other Ambulatory Visit: Payer: Self-pay | Admitting: Internal Medicine

## 2013-11-05 DIAGNOSIS — R1310 Dysphagia, unspecified: Secondary | ICD-10-CM

## 2013-11-07 ENCOUNTER — Inpatient Hospital Stay: Admission: RE | Admit: 2013-11-07 | Payer: Self-pay | Source: Ambulatory Visit

## 2014-01-18 ENCOUNTER — Encounter (HOSPITAL_COMMUNITY): Payer: Self-pay | Admitting: Emergency Medicine

## 2014-01-18 ENCOUNTER — Emergency Department (HOSPITAL_COMMUNITY)
Admission: EM | Admit: 2014-01-18 | Discharge: 2014-01-18 | Disposition: A | Discharge status: Home / Self Care | Payer: Worker's Compensation | Attending: Emergency Medicine | Admitting: Emergency Medicine

## 2014-01-18 DIAGNOSIS — I059 Rheumatic mitral valve disease, unspecified: Secondary | ICD-10-CM | POA: Insufficient documentation

## 2014-01-18 DIAGNOSIS — Z860100 Personal history of colon polyps, unspecified: Secondary | ICD-10-CM | POA: Insufficient documentation

## 2014-01-18 DIAGNOSIS — G8929 Other chronic pain: Secondary | ICD-10-CM | POA: Insufficient documentation

## 2014-01-18 DIAGNOSIS — Z79899 Other long term (current) drug therapy: Secondary | ICD-10-CM | POA: Insufficient documentation

## 2014-01-18 DIAGNOSIS — M1610 Unilateral primary osteoarthritis, unspecified hip: Secondary | ICD-10-CM | POA: Insufficient documentation

## 2014-01-18 DIAGNOSIS — Z87448 Personal history of other diseases of urinary system: Secondary | ICD-10-CM | POA: Insufficient documentation

## 2014-01-18 DIAGNOSIS — H5500 Unspecified nystagmus: Secondary | ICD-10-CM | POA: Insufficient documentation

## 2014-01-18 DIAGNOSIS — M169 Osteoarthritis of hip, unspecified: Secondary | ICD-10-CM | POA: Insufficient documentation

## 2014-01-18 DIAGNOSIS — K219 Gastro-esophageal reflux disease without esophagitis: Secondary | ICD-10-CM | POA: Insufficient documentation

## 2014-01-18 DIAGNOSIS — E785 Hyperlipidemia, unspecified: Secondary | ICD-10-CM | POA: Insufficient documentation

## 2014-01-18 DIAGNOSIS — F329 Major depressive disorder, single episode, unspecified: Secondary | ICD-10-CM | POA: Insufficient documentation

## 2014-01-18 DIAGNOSIS — R42 Dizziness and giddiness: Secondary | ICD-10-CM | POA: Insufficient documentation

## 2014-01-18 DIAGNOSIS — R110 Nausea: Secondary | ICD-10-CM | POA: Insufficient documentation

## 2014-01-18 DIAGNOSIS — Z86711 Personal history of pulmonary embolism: Secondary | ICD-10-CM | POA: Insufficient documentation

## 2014-01-18 DIAGNOSIS — Z862 Personal history of diseases of the blood and blood-forming organs and certain disorders involving the immune mechanism: Secondary | ICD-10-CM | POA: Insufficient documentation

## 2014-01-18 DIAGNOSIS — Z8711 Personal history of peptic ulcer disease: Secondary | ICD-10-CM | POA: Insufficient documentation

## 2014-01-18 DIAGNOSIS — M19049 Primary osteoarthritis, unspecified hand: Secondary | ICD-10-CM | POA: Insufficient documentation

## 2014-01-18 DIAGNOSIS — Z87898 Personal history of other specified conditions: Secondary | ICD-10-CM | POA: Insufficient documentation

## 2014-01-18 DIAGNOSIS — R269 Unspecified abnormalities of gait and mobility: Secondary | ICD-10-CM | POA: Insufficient documentation

## 2014-01-18 DIAGNOSIS — F3289 Other specified depressive episodes: Secondary | ICD-10-CM | POA: Insufficient documentation

## 2014-01-18 DIAGNOSIS — F410 Panic disorder [episodic paroxysmal anxiety] without agoraphobia: Secondary | ICD-10-CM | POA: Insufficient documentation

## 2014-01-18 DIAGNOSIS — Z8709 Personal history of other diseases of the respiratory system: Secondary | ICD-10-CM | POA: Insufficient documentation

## 2014-01-18 DIAGNOSIS — K5900 Constipation, unspecified: Secondary | ICD-10-CM | POA: Insufficient documentation

## 2014-01-18 LAB — COMPREHENSIVE METABOLIC PANEL
ALBUMIN: 4.1 g/dL (ref 3.5–5.2)
ALK PHOS: 115 U/L (ref 39–117)
ALT: 19 U/L (ref 0–53)
AST: 22 U/L (ref 0–37)
BILIRUBIN TOTAL: 0.6 mg/dL (ref 0.3–1.2)
BUN: 18 mg/dL (ref 6–23)
CHLORIDE: 106 meq/L (ref 96–112)
CO2: 23 mEq/L (ref 19–32)
Calcium: 9.5 mg/dL (ref 8.4–10.5)
Creatinine, Ser: 1.01 mg/dL (ref 0.50–1.35)
GFR calc Af Amer: >90 mL/min (ref >90)
GFR calc non Af Amer: 87 mL/min — ABNORMAL LOW (ref >90)
Glucose, Bld: 96 mg/dL (ref 70–99)
Potassium: 4 mEq/L (ref 3.7–5.3)
SODIUM: 143 meq/L (ref 137–147)
Total Protein: 7.3 g/dL (ref 6.0–8.3)

## 2014-01-18 LAB — CBC WITH DIFFERENTIAL/PLATELET
BASOS PCT: 0 % (ref 0–1)
Basophils Absolute: 0 10*3/uL (ref 0.0–0.1)
Eosinophils Absolute: 0.1 10*3/uL (ref 0.0–0.7)
Eosinophils Relative: 1 % (ref 0–5)
HCT: 43.9 % (ref 39.0–52.0)
HEMOGLOBIN: 15 g/dL (ref 13.0–17.0)
Lymphocytes Relative: 17 % (ref 12–46)
Lymphs Abs: 1.5 10*3/uL (ref 0.7–4.0)
MCH: 30.8 pg (ref 26.0–34.0)
MCHC: 34.2 g/dL (ref 30.0–36.0)
MCV: 90.1 fL (ref 78.0–100.0)
MONOS PCT: 5 % (ref 3–12)
Monocytes Absolute: 0.4 10*3/uL (ref 0.1–1.0)
Neutro Abs: 6.6 10*3/uL (ref 1.7–7.7)
Neutrophils Relative %: 77 % (ref 43–77)
PLATELETS: 186 10*3/uL (ref 150–400)
RBC: 4.87 MIL/uL (ref 4.22–5.81)
RDW: 13 % (ref 11.5–15.5)
WBC: 8.6 10*3/uL (ref 4.0–10.5)

## 2014-01-18 MED ORDER — ONDANSETRON 4 MG PO TBDP: 4.0000 mg | ORAL_TABLET | Freq: Three times a day (TID) | ORAL | Status: DC | PRN

## 2014-01-18 MED ORDER — MECLIZINE HCL 25 MG PO TABS: 25.0000 mg | ORAL_TABLET | Freq: Three times a day (TID) | ORAL | Status: DC | PRN

## 2014-01-18 MED ORDER — MECLIZINE HCL 25 MG PO TABS
25.0000 mg | ORAL_TABLET | Freq: Once | ORAL | Status: AC
  Administered 2014-01-18: 25 mg via ORAL
  Filled 2014-01-18: qty 1

## 2014-01-18 MED ORDER — ONDANSETRON 4 MG PO TBDP
8.0000 mg | ORAL_TABLET | Freq: Once | ORAL | Status: AC
  Administered 2014-01-18: 8 mg via ORAL
  Filled 2014-01-18: qty 2

## 2014-01-18 MED ORDER — DIAZEPAM 5 MG/ML IJ SOLN
2.5000 mg | Freq: Once | INTRAMUSCULAR | Status: DC
  Filled 2014-01-18: qty 2

## 2014-01-18 NOTE — ED Notes (Signed)
PA at bedside.

## 2014-01-18 NOTE — ED Notes (Signed)
Pt discussed with dr Eulis Foster .  No further orders given

## 2014-01-18 NOTE — ED Provider Notes (Signed)
CSN: [id number]     Arrival date & time 01/18/14  1528 History   First MD Initiated Contact with Patient 01/18/14 1740     Chief Complaint  Patient presents with  . Headache     (Consider location/radiation/quality/duration/timing/severity/associated sxs/prior Treatment) HPI Comments: Patient is a 48 year old male with past medical of anxiety, ulcers, cervical laminectomy, presenting to the emergency department with chief complaint of room spinning for over a week. 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 Patient reports recent exposure he had headache, nausea, dizziness. The patient reports he was evaluated by a Worker's Comp. physician several days ago and was diagnosed with "propane exposure".  He reports dizziness increased with head movement, body movement, opening of eyes.  Denies history of vertigo. The Worker's Comp provider advised patient to stop taking his Ativan, while he was experiencing symptoms  The history is provided by the spouse. No language interpreter was used.    Past Medical History  Diagnosis Date  . Anxiety     panic attacks  . Ulcer     gastric 1988  . Peptic ulcer 1994    severe requiring vagotomy and pyloroplasty  . Hoarseness of voice     chronic  . Dyslipidemia     "good cholesterol isn't as high as dr would like" (01/01/2013)  . Erectile dysfunction     organic  . Family history of colon cancer   . Lipoma of arm 1994    right  . Constipation   . Diarrhea   . Nasal congestion   . Trouble swallowing   . Palpitations   . Abdominal distension   . Rectal bleeding   . Blood in stool   . Difficulty urinating   . Tubular adenoma of colon 08/2012  . Mitral valve prolapse     "mild" (01/01/2013)  . Anemia   . GERD (gastroesophageal reflux disease)   . Depression   . PONV (postoperative nausea and vomiting)   . Family history of anesthesia complication     "Mom; PONV" (01/01/2013)  .  Pulmonary embolism on right 01/01/2013  . Shortness of breath     "related to the PE today" (01/01/2013)  . Obstructive sleep apnea     miald w aith RDI of 6.4 per hour  . History of blood transfusion 1980's    "related to bleeding ulcers" (01/01/2013)  . Generalized headaches     "monthly" (01/01/2013)  . DDD (degenerative disc disease), thoracolumbar   . DDD (degenerative disc disease), cervical   . DDD (degenerative disc disease), lumbosacral   . Osteoarthritis     "hips & hands" (01/01/2013)  . Chronic back pain     "neck to sacral" (01/01/2013)   Past Surgical History  Procedure Laterality Date  . Vagotomy and pyloroplasty  1988    "partial; for ulcers" (01/01/2013)  . Colonoscopy  07/2007    showed polyps, follow up in 2013  . Tonsillectomy  2008  . Posterior cervical fusion/foraminotomy  2012    fusion  . Incisional hernia repair N/A 12/25/2012    Procedure: LAPAROSCOPIC REPAIR MULITPLE INCISIONAL HERNIA WITH MESH;  Surgeon: Adin Hector, MD;  Location: [facility];  Service: General;  Laterality: N/A;  . Insertion of mesh N/A 12/25/2012    Procedure: INSERTION OF MESH;  Surgeon: Adin Hector, MD;  Location: [facility];  Service: General;  Laterality: N/A;  . Excisional hemorrhoidectomy      "  w/colonoscopies" (01/01/2013)  . Hernia repair  12/25/12    multi inc hernia   Family History  Problem Relation Age of Onset  . Colon cancer Mother   . Lymphoma Mother     non- hodgkins  . COPD Mother   . Melanoma Mother   . Ovarian cancer Sister   . Ulcerative colitis Sister   . Kidney cancer Father   . Heart disease Father   . Cancer Sister     brain  . Breast cancer Sister    History  Substance Use Topics  . Smoking status: Never Smoker   . Smokeless tobacco: Never Used  . Alcohol Use: Yes     Comment: 01/01/2013 "maybe 1-2 beers/month"    Review of Systems  Constitutional: Negative for fever and chills.  Eyes: Negative for photophobia and visual disturbance.   Gastrointestinal: Positive for nausea. Negative for abdominal pain.  Musculoskeletal: Positive for gait problem.  Neurological: Positive for dizziness and headaches. Negative for seizures, syncope, facial asymmetry, speech difficulty, weakness and numbness.      Allergies  Morphine and related  Home Medications   Prior to Admission medications   Medication Sig Start Date End Date Taking? Authorizing Provider  docusate sodium 100 MG CAPS Take 200 mg by mouth 2 (two) times daily. 01/07/13   Ravisankar R Avva, MD  HYDROcodone-acetaminophen (VICODIN) 5-500 MG per tablet Take 1 tablet by mouth every 6 (six) hours as needed for pain. pain    Historical Provider, MD  LORazepam (ATIVAN) 0.5 MG tablet Take 0.5 mg by mouth daily as needed for anxiety.  09/05/12   Historical Provider, MD  metaxalone (SKELAXIN) 800 MG tablet Take 800 mg by mouth 3 (three) times daily as needed for pain.     Historical Provider, MD  metoprolol succinate (TOPROL-XL) 25 MG 24 hr tablet Take 25 mg by mouth daily.    Historical Provider, MD  Multiple Vitamin (MULTIVITAMIN WITH MINERALS) TABS Take 1 tablet by mouth daily.    Historical Provider, MD  omeprazole (PRILOSEC) 40 MG capsule Take 1 capsule (40 mg total) by mouth daily. 11/06/12   Ladene Artist, MD  sertraline (ZOLOFT) 100 MG tablet Take 100 mg by mouth Daily.  08/08/12   Historical Provider, MD   BP 104/78  Pulse 80  Temp(Src) 98.2 F (36.8 C) (Oral)  Resp 18  Ht 5\' 11"  (1.803 m)  Wt 210 lb (95.255 kg)  BMI 29.30 kg/m2  SpO2 97% Physical Exam  Nursing note and vitals reviewed. Constitutional: He is oriented to person, place, and time. He appears well-developed and well-nourished. No distress.  HENT:  Head: Normocephalic and atraumatic.  Right Ear: Tympanic membrane is scarred.  Left Ear: Tympanic membrane is scarred.  Eyes: Conjunctivae are normal. Pupils are equal, round, and reactive to light. No scleral icterus. Right eye exhibits nystagmus. Left eye  exhibits nystagmus.  Horizontal nystagmus.   Neck: Neck supple.  Cardiovascular: Normal rate, regular rhythm and normal heart sounds.   No murmur heard. Pulmonary/Chest: Effort normal and breath sounds normal. He has no wheezes.  Abdominal: Soft. Bowel sounds are normal. There is no tenderness. There is no rebound and no guarding.  Musculoskeletal: Normal range of motion. He exhibits no edema.  Neurological: He is alert and oriented to person, place, and time. No cranial nerve deficit. He exhibits normal muscle tone. GCS eye subscore is 4. GCS verbal subscore is 5. GCS motor subscore is 6.  Speech is clear and goal oriented, follows commands  Cranial nerves III - XII grossly intact, no facial droop Normal strength in upper and lower extremities bilaterally, strong and equal grip strength Sensation normal to light and sharp touch Moves all 4 extremities without ataxia, coordination intact Normal finger to nose and rapid alternating movements No pronator drift  Skin: Skin is warm and dry. No rash noted.  Psychiatric: His speech is normal and behavior is normal. His mood appears anxious.    ED Course  Procedures (including critical care time) Labs Review Results for orders placed during the hospital encounter of 01/18/14  CBC WITH DIFFERENTIAL      Result Value Ref Range   WBC 8.6  4.0 - 10.5 K/uL   RBC 4.87  4.22 - 5.81 MIL/uL   Hemoglobin 15.0  13.0 - 17.0 g/dL   HCT 43.9  39.0 - 52.0 %   MCV 90.1  78.0 - 100.0 fL   MCH 30.8  26.0 - 34.0 pg   MCHC 34.2  30.0 - 36.0 g/dL   RDW 13.0  11.5 - 15.5 %   Platelets 186  150 - 400 K/uL   Neutrophils Relative % 77  43 - 77 %   Neutro Abs 6.6  1.7 - 7.7 K/uL   Lymphocytes Relative 17  12 - 46 %   Lymphs Abs 1.5  0.7 - 4.0 K/uL   Monocytes Relative 5  3 - 12 %   Monocytes Absolute 0.4  0.1 - 1.0 K/uL   Eosinophils Relative 1  0 - 5 %   Eosinophils Absolute 0.1  0.0 - 0.7 K/uL   Basophils Relative 0  0 - 1 %   Basophils Absolute 0.0  0.0  - 0.1 K/uL  COMPREHENSIVE METABOLIC PANEL      Result Value Ref Range   Sodium 143  137 - 147 mEq/L   Potassium 4.0  3.7 - 5.3 mEq/L   Chloride 106  96 - 112 mEq/L   CO2 23  19 - 32 mEq/L   Glucose, Bld 96  70 - 99 mg/dL   BUN 18  6 - 23 mg/dL   Creatinine, Ser 1.01  0.50 - 1.35 mg/dL   Calcium 9.5  8.4 - 10.5 mg/dL   Total Protein 7.3  6.0 - 8.3 g/dL   Albumin 4.1  3.5 - 5.2 g/dL   AST 22  0 - 37 U/L   ALT 19  0 - 53 U/L   Alkaline Phosphatase 115  39 - 117 U/L   Total Bilirubin 0.6  0.3 - 1.2 mg/dL   GFR calc non Af Amer 87 (*) >90 mL/min   GFR calc Af Amer >90  >90 mL/min   No results found.   Imaging Review No results found.   EKG Interpretation None      MDM   Final diagnoses:  Vertigo   Patient with BPV symptoms. Per RN poison control reports that a propane should have been out of the patient's system by now. No lingering affects.   Discussed patient history and condition with Dr. Jeanell Sparrow advises meclizine an outpatient followup. Patient became upset with the RN, because of amount of time waiting. And reports the RN had "attitude".  Apologize several times the patient and his wife. Dr. Jeanell Sparrow also evaluated the patient after mind counter to dissipate the situation with the R.N. Advises Valium for anxiety.    Meds given in ED:  Medications  ondansetron (ZOFRAN-ODT) disintegrating tablet 8 mg (8 mg Oral Given 01/18/14 1542)  meclizine (ANTIVERT)  tablet 25 mg (25 mg Oral Given 01/18/14 1922)    Discharge Medication List as of 01/18/2014  8:20 PM    START taking these medications   Details  meclizine (ANTIVERT) 25 MG tablet Take 1 tablet (25 mg total) by mouth 3 (three) times daily as needed for dizziness., Starting 01/18/2014, Until Discontinued, Print    ondansetron (ZOFRAN ODT) 4 MG disintegrating tablet Take 1 tablet (4 mg total) by mouth every 8 (eight) hours as needed for nausea or vomiting., Starting 01/18/2014, Until Discontinued, Print            Lorrine Kin, PA-C 01/19/14 2008

## 2014-01-18 NOTE — ED Notes (Signed)
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  He has been ill since then

## 2014-01-18 NOTE — Discharge Instructions (Signed)
Call for a follow up appointment with a Family or Primary Care Provider.  Return if Symptoms worsen.   Take medication as prescribed.  If you feel dizzy sit down immediately.

## 2014-01-18 NOTE — ED Notes (Addendum)
Reported to [employer], no instructions.  Spoke with Hilda Blades and she reports that the propane odor should be out of his system

## 2014-01-18 NOTE — ED Notes (Addendum)
Pt. 's wife and husband became upset and stated, "You are going to give me medicine and not run any tests"  Explained to pt. And his wife that it would be up to the doctor to order tests.    Pt. And wife was upset ,and yelling at [employer],.  Explained to them that I would get the doctor and there is no need to yell at me.  Husband became angry and sat up in bed and stated, "Get out of my room, do not come back in my room."

## 2014-01-20 NOTE — ED Provider Notes (Signed)
48 y.o. Male with complaints of propane exposure and vertigo.  Patient with history and exam c.w. Peripheral vertigo.  No nystagmus noted on my exam and no evidence of ataxia with normal heel to shin and finger to nose bilaterally.  Patient and wife advised and voice understanding.  I performed a history and physical examination of Liliana Cline and discussed his management with Ms. Jerline Pain.  I agree with the history, physical, assessment, and plan of care, with the following exceptions: None  I was present for the following procedures: None Time Spent in Critical Care of the patient: None Time spent in discussions with the patient and family: Gayle Mill, MD 01/20/14 [phone]

## 2014-08-15 ENCOUNTER — Encounter (HOSPITAL_COMMUNITY): Payer: Self-pay | Admitting: Cardiology

## 2014-10-29 ENCOUNTER — Ambulatory Visit: Payer: BLUE CROSS/BLUE SHIELD | Admitting: Podiatry

## 2015-11-10 IMAGING — CR DG LUMBAR SPINE COMPLETE 4+V
5 series · 5 of 5 positions shown · non-contrast
Comparison: MR 10/30/2010

CLINICAL DATA: Low back and right hip pain.

EXAM:
LUMBAR SPINE - COMPLETE 4+ VIEW

[t l-spine a.p.]
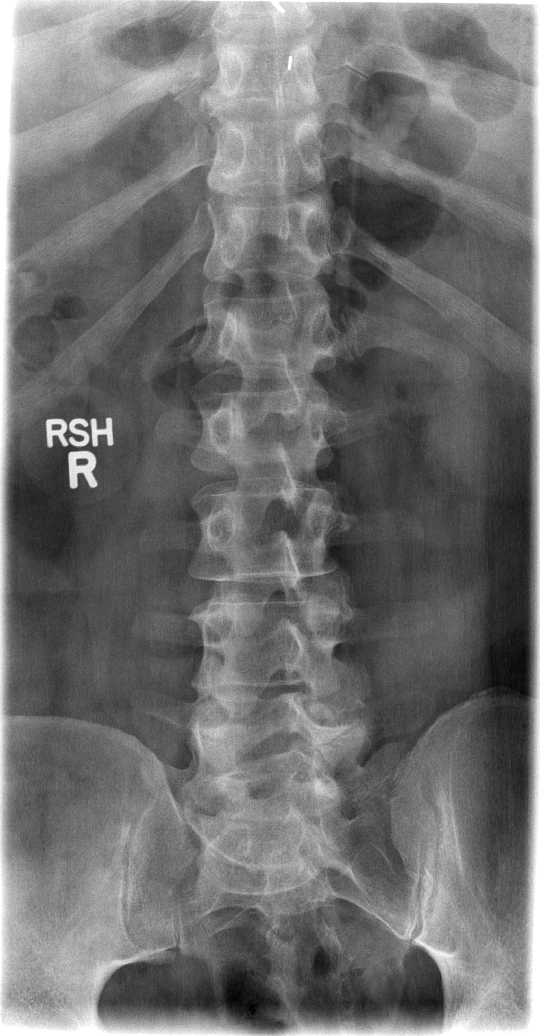

[t l-spine oblique exposure (1 of 2)]
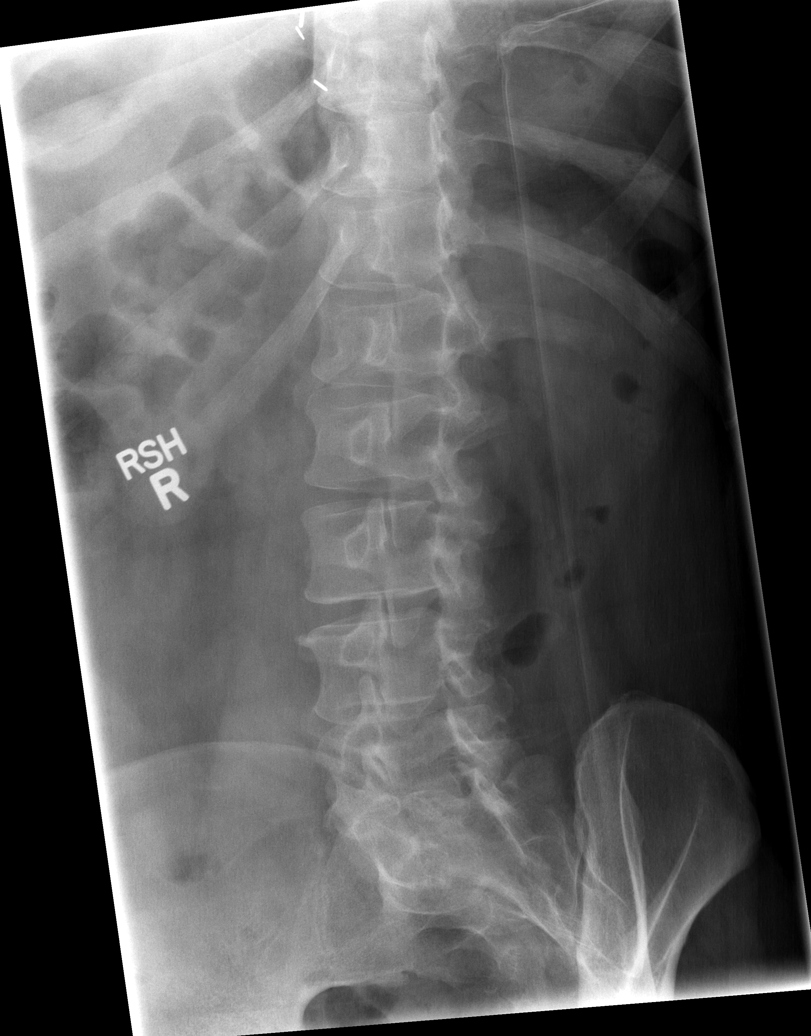

[t l-spine oblique exposure (2 of 2)]
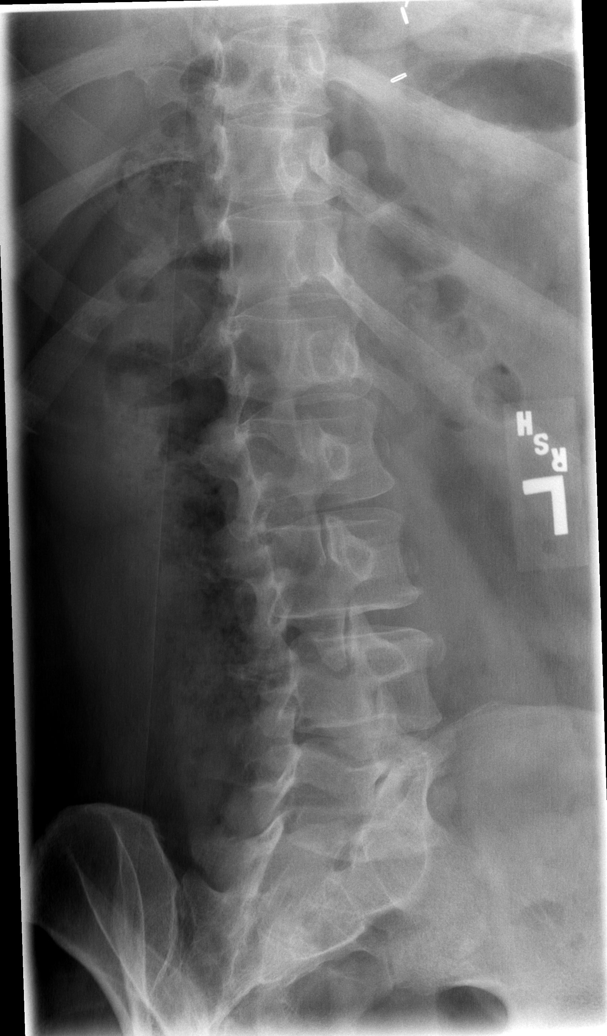

[t l-spine lat]
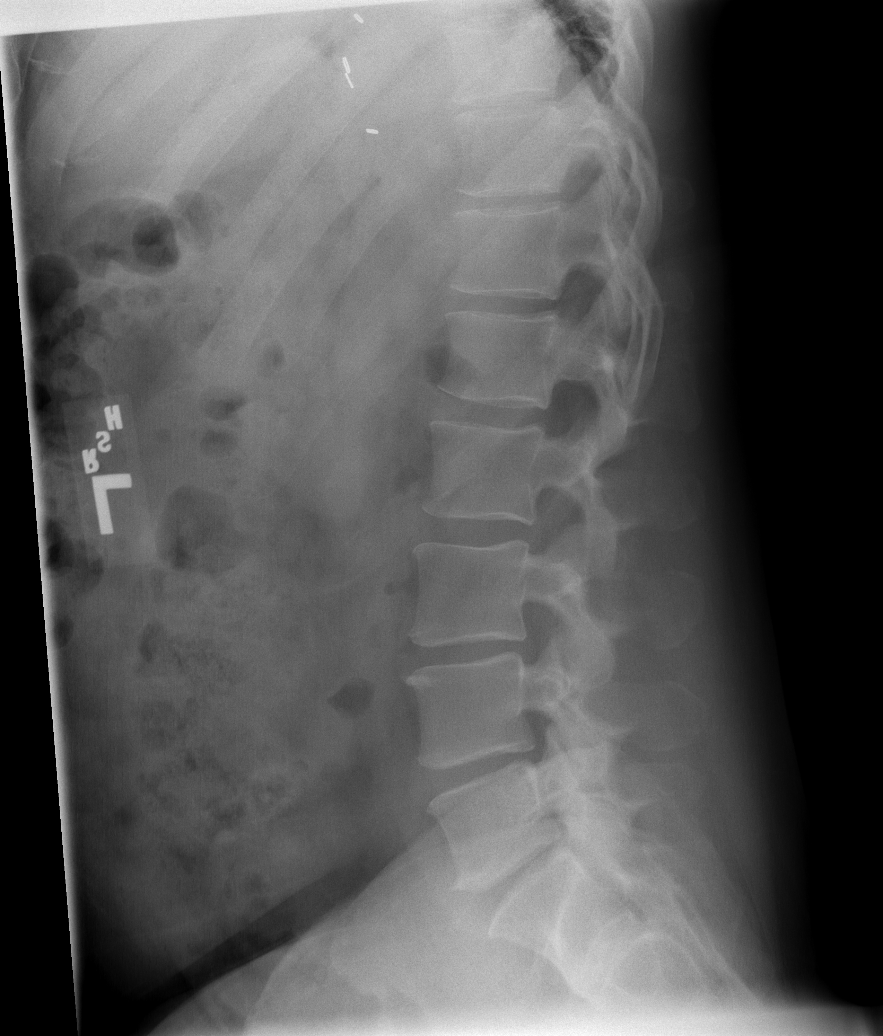

[t l-spine l5-s1 spot]
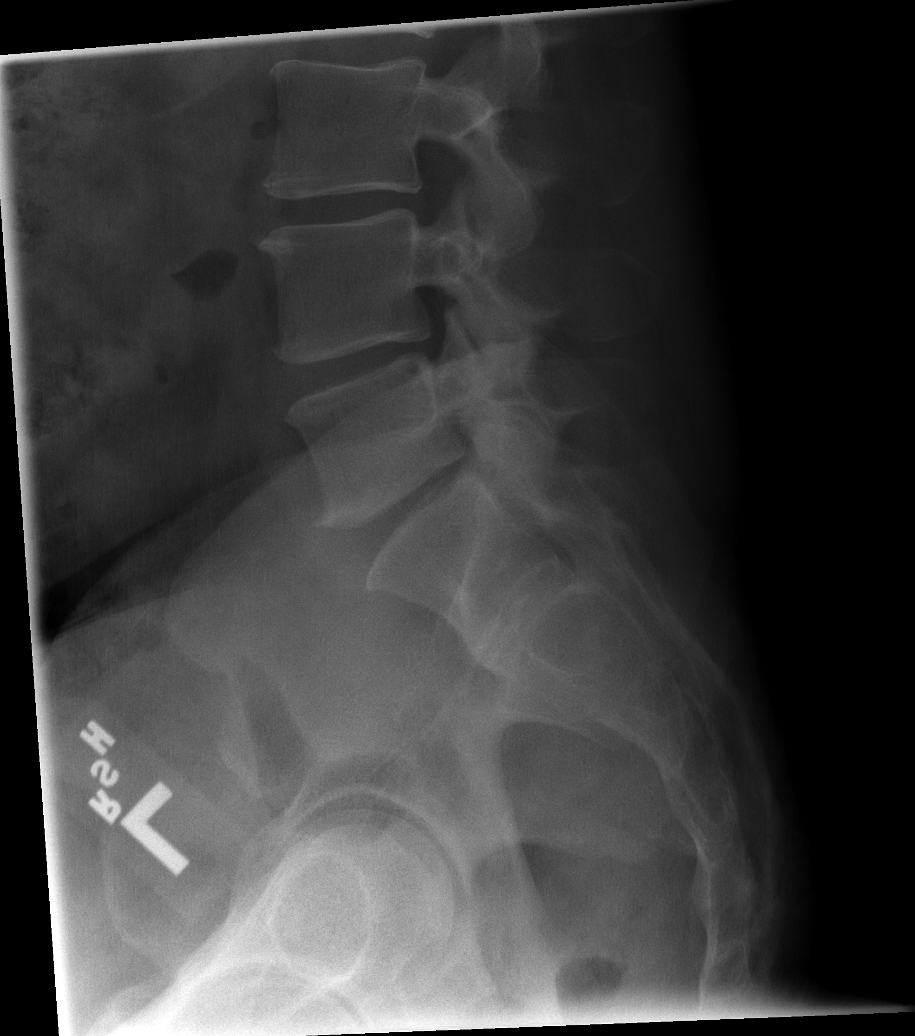

[5 of 5 positions shown; findings below may reference images not displayed]

FINDINGS: Mild narrowing of L3-4, L4-5, and L5-S1 interspaces. Small anterior
endplate spurs L3-S1. Normal alignment. Negative for fracture.
Surgical clips in the epigastrium.
IMPRESSION: 1. Negative for fracture or other acute bone abnormality.
2. Degenerative disc disease L3- S1.

## 2017-09-02 ENCOUNTER — Encounter: Payer: Self-pay | Admitting: Gastroenterology

## 2017-10-10 ENCOUNTER — Encounter: Payer: Self-pay | Admitting: Gastroenterology

## 2017-12-05 ENCOUNTER — Other Ambulatory Visit: Payer: Self-pay

## 2017-12-05 ENCOUNTER — Ambulatory Visit (AMBULATORY_SURGERY_CENTER): Payer: Self-pay | Admitting: *Deleted

## 2017-12-05 VITALS — Ht 71.0 in | Wt 220.0 lb

## 2017-12-05 DIAGNOSIS — Z860100 Personal history of colon polyps, unspecified: Secondary | ICD-10-CM

## 2017-12-05 DIAGNOSIS — Z800 Family history of malignant neoplasm of digestive organs: Secondary | ICD-10-CM

## 2017-12-05 MED ORDER — NA SULFATE-K SULFATE-MG SULF 17.5-3.13-1.6 GM/177ML PO SOLN: 1.0000 | Freq: Once | ORAL | 0 refills | Status: AC

## 2017-12-05 NOTE — Progress Notes (Signed)
No egg or soy allergy known to patient  issues with past sedation with any surgeries  or procedures of PONV , no intubation problems  No diet pills per patient No home 02 use per patient  No blood thinners per patient  Pt has  issues with constipation - pt has hard stools and does not stool daily- pt given 2 day prep as a result of this  No A fib or A flutter  EMMI video sent to pt's e mail - pt declined

## 2017-12-09 ENCOUNTER — Encounter: Payer: Self-pay | Admitting: Gastroenterology

## 2017-12-19 ENCOUNTER — Encounter: Payer: Self-pay | Admitting: Gastroenterology

## 2017-12-19 ENCOUNTER — Ambulatory Visit (AMBULATORY_SURGERY_CENTER): Payer: Managed Care, Other (non HMO) | Admitting: Gastroenterology

## 2017-12-19 ENCOUNTER — Other Ambulatory Visit: Payer: Self-pay

## 2017-12-19 VITALS — BP 100/63 | HR 59 | Temp 99.1°F | Resp 10 | Ht 71.0 in | Wt 220.0 lb

## 2017-12-19 DIAGNOSIS — Z86010 Personal history of colonic polyps: Secondary | ICD-10-CM

## 2017-12-19 DIAGNOSIS — D124 Benign neoplasm of descending colon: Secondary | ICD-10-CM

## 2017-12-19 DIAGNOSIS — D123 Benign neoplasm of transverse colon: Secondary | ICD-10-CM

## 2017-12-19 DIAGNOSIS — D125 Benign neoplasm of sigmoid colon: Secondary | ICD-10-CM

## 2017-12-19 MED ORDER — SODIUM CHLORIDE 0.9 % IV SOLN: 500.0000 mL | Freq: Once | INTRAVENOUS | Status: DC

## 2017-12-19 NOTE — Progress Notes (Signed)
Pt. Reports no change in his medical or surgical history since his pre-visit 12/05/2017.

## 2017-12-19 NOTE — Op Note (Signed)
[name] Procedure Date: 12/19/2017 11:48 AM MRN: [id number] Endoscopist: Ladene Artist , MD Age: 52 Referring MD:  Date of Birth: March 11, 1966 Gender: Male Account #: [id number] Procedure:                Colonoscopy Indications:              Surveillance: Personal history of adenomatous                            polyps on last colonoscopy 5 years ago. Family                            history of colon cancer, mother. Family history of                            colon polyps. Medicines:                Monitored Anesthesia Care Procedure:                Pre-Anesthesia Assessment:                           - Prior to the procedure, a History and Physical                            was performed, and patient medications and                            allergies were reviewed. The patient's tolerance of                            previous anesthesia was also reviewed. The risks                            and benefits of the procedure and the sedation                            options and risks were discussed with the patient.                            All questions were answered, and informed consent                            was obtained. Prior Anticoagulants: The patient has                            taken no previous anticoagulant or antiplatelet                            agents. ASA Grade Assessment: III - A patient with                            severe systemic disease. After reviewing the risks  and benefits, the patient was deemed in                            satisfactory condition to undergo the procedure.                           After obtaining informed consent, the colonoscope                            was passed under direct vision. Throughout the                            procedure, the patient's blood pressure, pulse, and                            oxygen saturations were monitored continuously.  The                            Colonoscope was introduced through the anus and                            advanced to the the cecum, identified by                            appendiceal orifice and ileocecal valve. The                            ileocecal valve, appendiceal orifice, and rectum                            were photographed. The quality of the bowel                            preparation was adequate after extensive lavage and                            suctioning. The colonoscopy was performed without                            difficulty. The patient tolerated the procedure                            well. Scope In: 27:78:24 AM Scope Out: 23:53:61 PM Total Procedure Duration: 0 hours 20 minutes 36 seconds  Findings:                 The perianal and digital rectal examinations were                            normal.                           Four sessile polyps were found in the descending  colon (1) and transverse colon (3). The polyps were                            4 to 5 mm in size. These polyps were removed with a                            cold snare. Resection and retrieval were complete.                           Five sessile polyps were found in the sigmoid colon                            (2) and transverse colon (3). The polyps were 3 to                            4 mm in size. These polyps were removed with a cold                            biopsy forceps. Resection and retrieval were                            complete.                           A diffuse area of moderately nodular mucosa was                            found in the descending colon and in the transverse                            colon c/w prominent lymphoid tissue.                           Internal hemorrhoids were found during                            retroflexion. The hemorrhoids were small and Grade                            I (internal hemorrhoids that do not  prolapse).                           The exam was otherwise without abnormality on                            direct and retroflexion views. Complications:            No immediate complications. Estimated blood loss:                            None. Estimated Blood Loss:     Estimated blood loss: none. Impression:               - Four 4 to 5 mm polyps in the  descending colon and                            in the transverse colon, removed with a cold snare.                            Resected and retrieved.                           - Five 3 to 4 mm polyps in the sigmoid colon and in                            the transverse colon, removed with a cold biopsy                            forceps. Resected and retrieved.                           - Nodular mucosa in the descending colon and in the                            transverse colon.                           - Internal hemorrhoids.                           - The examination was otherwise normal on direct                            and retroflexion views. Recommendation:           - Repeat colonoscopy in 2 - 3 years for                            surveillance pending pathology review with a more                            extensive bowel prep.                           - Patient has a contact number available for                            emergencies. The signs and symptoms of potential                            delayed complications were discussed with the                            patient. Return to normal activities tomorrow.                            Written discharge instructions were provided to the  patient.                           - Resume previous diet.                           - Continue present medications.                           - Await pathology results. Ladene Artist, MD 12/19/2017 12:25:55 PM This report has been signed electronically.

## 2017-12-19 NOTE — Patient Instructions (Signed)
Impression/Recommendations:  Polyp handout given to patient. Hemorrhoid handout given to patient.  Repeat colonoscopy in 2-3 years for surveillance.  Date to be determined after pathology results reviewed.  Resume previous diet. Continue present medications.  YOU HAD AN ENDOSCOPIC PROCEDURE TODAY AT [facility]:   Refer to the procedure report that was given to you for any specific questions about what was found during the examination.  If the procedure report does not answer your questions, please call your gastroenterologist to clarify.  If you requested that your care partner not be given the details of your procedure findings, then the procedure report has been included in a sealed envelope for you to review at your convenience later.  YOU SHOULD EXPECT: Some feelings of bloating in the abdomen. Passage of more gas than usual.  Walking can help get rid of the air that was put into your GI tract during the procedure and reduce the bloating. If you had a lower endoscopy (such as a colonoscopy or flexible sigmoidoscopy) you may notice spotting of blood in your stool or on the toilet paper. If you underwent a bowel prep for your procedure, you may not have a normal bowel movement for a few days.  Please Note:  You might notice some irritation and congestion in your nose or some drainage.  This is from the oxygen used during your procedure.  There is no need for concern and it should clear up in a day or so.  SYMPTOMS TO REPORT IMMEDIATELY:   Following lower endoscopy (colonoscopy or flexible sigmoidoscopy):  Excessive amounts of blood in the stool  Significant tenderness or worsening of abdominal pains  Swelling of the abdomen that is new, acute  Fever of 100F or higher  For urgent or emergent issues, a gastroenterologist can be reached at any hour by calling [phone].   DIET:  We do recommend a small meal at first, but then you may proceed to your regular diet.   Drink plenty of fluids but you should avoid alcoholic beverages for 24 hours.  ACTIVITY:  You should plan to take it easy for the rest of today and you should NOT DRIVE or use heavy machinery until tomorrow (because of the sedation medicines used during the test).    FOLLOW UP: Our staff will call the number listed on your records the next business day following your procedure to check on you and address any questions or concerns that you may have regarding the information given to you following your procedure. If we do not reach you, we will leave a message.  However, if you are feeling well and you are not experiencing any problems, there is no need to return our call.  We will assume that you have returned to your regular daily activities without incident.  If any biopsies were taken you will be contacted by phone or by letter within the next 1-3 weeks.  Please call us at [phone] if you have not heard about the biopsies in 3 weeks.    SIGNATURES/CONFIDENTIALITY: You and/or your care partner have signed paperwork which will be entered into your electronic medical record.  These signatures attest to the fact that that the information above on your After Visit Summary has been reviewed and is understood.  Full responsibility of the confidentiality of this discharge information lies with you and/or your care-partner.

## 2017-12-19 NOTE — Progress Notes (Signed)
To PACU, VSS. Report to [email or web address] 

## 2017-12-19 NOTE — Progress Notes (Signed)
Called to room to assist during endoscopic procedure.  Patient ID and intended procedure confirmed with present staff. Received instructions for my participation in the procedure from the performing physician.  

## 2017-12-20 ENCOUNTER — Telehealth: Payer: Self-pay

## 2017-12-20 NOTE — Telephone Encounter (Signed)
NO ANSWER, MESSAGE LEFT FOR PATIENT. 

## 2017-12-20 NOTE — Telephone Encounter (Signed)
Second follow up call, left a voicemail, number identifier.

## 2017-12-23 ENCOUNTER — Encounter: Payer: Self-pay | Admitting: Gastroenterology

## 2019-06-22 ENCOUNTER — Other Ambulatory Visit: Payer: Self-pay

## 2019-06-22 DIAGNOSIS — Z20822 Contact with and (suspected) exposure to covid-19: Secondary | ICD-10-CM

## 2019-06-24 LAB — NOVEL CORONAVIRUS, NAA: SARS-CoV-2, NAA: NOT DETECTED

## 2021-01-05 ENCOUNTER — Ambulatory Visit
Admission: EM | Admit: 2021-01-05 | Discharge: 2021-01-05 | Disposition: A | Discharge status: Home / Self Care | Payer: Managed Care, Other (non HMO) | Attending: Emergency Medicine | Admitting: Emergency Medicine

## 2021-01-05 ENCOUNTER — Other Ambulatory Visit: Payer: Self-pay

## 2021-01-05 ENCOUNTER — Encounter: Payer: Self-pay | Admitting: Emergency Medicine

## 2021-01-05 DIAGNOSIS — L03031 Cellulitis of right toe: Secondary | ICD-10-CM | POA: Diagnosis not present

## 2021-01-05 DIAGNOSIS — L600 Ingrowing nail: Secondary | ICD-10-CM | POA: Diagnosis not present

## 2021-01-05 MED ORDER — MUPIROCIN 2 % EX OINT: 1.0000 "application " | TOPICAL_OINTMENT | Freq: Two times a day (BID) | CUTANEOUS | 0 refills | Status: DC

## 2021-01-05 MED ORDER — CEPHALEXIN 500 MG PO CAPS: 500.0000 mg | ORAL_CAPSULE | Freq: Four times a day (QID) | ORAL | 0 refills | Status: AC

## 2021-01-05 MED ORDER — IBUPROFEN 800 MG PO TABS: 800.0000 mg | ORAL_TABLET | Freq: Three times a day (TID) | ORAL | 0 refills | Status: DC

## 2021-01-05 NOTE — ED Triage Notes (Signed)
Patient reports he has a right great toenail that is ingrown and concerned for infection.  Issues with this toe for a month

## 2021-01-05 NOTE — ED Provider Notes (Signed)
[facility]    CSN: [id number] Arrival date & time: 01/05/21  1001      History   Chief Complaint Chief Complaint  Patient presents with  . Ingrown Toenail    HPI Paul Griffin is a 55 y.o. male history of prior PE, OSA, presenting today for evaluation of possible ingrown nail and toe infection.  Reports that he has history of recurrent ingrown toenails.  Attempted to trim his ingrown toenail and over the past few weeks has had increased redness pain and swelling around this area.  Reports some pustular drainage.  Denies fevers.  HPI  Past Medical History:  Diagnosis Date  . Abdominal distension   . Anemia   . Anxiety    panic attacks  . Blood in stool   . Chronic back pain    "neck to sacral" (01/01/2013)  . Clotting disorder ([place])    PE right   . Constipation   . DDD (degenerative disc disease), cervical   . DDD (degenerative disc disease), lumbar   . DDD (degenerative disc disease), lumbosacral   . DDD (degenerative disc disease), thoracolumbar   . Depression   . Diarrhea   . Difficulty urinating   . Dyslipidemia    "good cholesterol isn't as high as dr would like" (01/01/2013)  . Erectile dysfunction    organic  . Family history of anesthesia complication    "Mom; PONV" (01/01/2013)  . Family history of colon cancer   . Generalized headaches    "monthly" (01/01/2013)  . GERD (gastroesophageal reflux disease)   . History of blood transfusion 1980's   "related to bleeding ulcers" (01/01/2013)  . Hoarseness of voice    chronic  . Hyperlipidemia   . Lipoma of arm 1994   right  . Mitral valve prolapse    "mild" (01/01/2013)  . Nasal congestion   . Obstructive sleep apnea    miald w aith RDI of 6.4 per hour  . Osteoarthritis    "hips & hands" (01/01/2013)  . Palpitations   . Peptic ulcer 1994   severe requiring vagotomy and pyloroplasty  . PONV (postoperative nausea and vomiting)   . Pulmonary embolism on right ([place]) 01/01/2013  . Rectal  bleeding   . Shortness of breath    "related to the PE today" (01/01/2013)  . Trouble swallowing   . Tubular adenoma of colon 08/2012  . Ulcer    gastric 1988    Patient Active Problem List   Diagnosis Date Noted  . Pulmonary embolism ([place]) 01/01/2013  . Unspecified constipation 01/01/2013    Class: Acute  . Obstructive sleep apnea 01/01/2013    Class: Chronic  . Hemoptysis 01/01/2013    Class: Acute  . Incisional hernia, without obstruction or gangrene 10/20/2012  . ANXIETY 11/03/2007  . INTERNAL HEMORRHOIDS WITHOUT MENTION COMP 11/03/2007  . EXTERNAL HEMORRHOIDS WITHOUT MENTION COMP 11/03/2007  . RECTAL POLYPS 11/03/2007  . HEMATOCHEZIA 11/03/2007  . ARTHRITIS 11/03/2007  . DUODENAL ULCER, HX OF 11/03/2007    Past Surgical History:  Procedure Laterality Date  . COLONOSCOPY  07/2007   showed polyps, follow up in 2013  . EXCISIONAL HEMORRHOIDECTOMY     "w/colonoscopies" (01/01/2013)  . HERNIA REPAIR  12/25/12   multi inc hernia  . INCISIONAL HERNIA REPAIR N/A 12/25/2012   Procedure: LAPAROSCOPIC REPAIR MULITPLE INCISIONAL HERNIA WITH MESH;  Surgeon: Adin Hector, MD;  Location: [place];  Service: General;  Laterality: N/A;  . INSERTION OF MESH N/A 12/25/2012  Procedure: INSERTION OF MESH;  Surgeon: Adin Hector, MD;  Location: [place];  Service: General;  Laterality: N/A;  . LEFT HEART CATHETERIZATION WITH CORONARY ANGIOGRAM N/A 10/15/2013   Procedure: LEFT HEART CATHETERIZATION WITH CORONARY ANGIOGRAM;  Surgeon: Laverda Page, MD;  Location: [facility] CATH LAB;  Service: Cardiovascular;  Laterality: N/A;  . POSTERIOR CERVICAL FUSION/FORAMINOTOMY  2012   fusion  . TONSILLECTOMY  2008  . VAGOTOMY AND PYLOROPLASTY  1988   "partial; for ulcers" (01/01/2013)       Home Medications    Prior to Admission medications   Medication Sig Start Date End Date Taking? Authorizing Provider  atorvastatin (LIPITOR) 10 MG tablet Take 10 mg by mouth daily.   Yes [provider]  celecoxib (CELEBREX) 200 MG capsule Take 200 mg by mouth daily.   Yes [provider]  cephALEXin (KEFLEX) 500 MG capsule Take 1 capsule (500 mg total) by mouth 4 (four) times daily for 7 days. 01/05/21 01/12/21 Yes Jatasia Gundrum C, PA-C  gabapentin (NEURONTIN) 300 MG capsule Take 300 mg by mouth as needed.   Yes [provider]  HYDROcodone-acetaminophen (VICODIN) 5-500 MG per tablet Take 1 tablet by mouth every 6 (six) hours as needed for pain. pain   Yes [provider]  ibuprofen (ADVIL) 800 MG tablet Take 1 tablet (800 mg total) by mouth 3 (three) times daily. 01/05/21  Yes Aryaa Bunting C, PA-C  metaxalone (SKELAXIN) 800 MG tablet Take 800 mg by mouth 3 (three) times daily as needed for pain.    Yes [provider]  mupirocin ointment (BACTROBAN) 2 % Apply 1 application topically 2 (two) times daily. 01/05/21  Yes Cleven Jansma C, PA-C  omeprazole (PRILOSEC) 40 MG capsule Take 1 capsule (40 mg total) by mouth daily. 11/06/12  Yes Ladene Artist, MD  sertraline (ZOLOFT) 100 MG tablet Take 100 mg by mouth Daily.  08/08/12  Yes [provider]  aspirin 81 MG tablet Take 81 mg by mouth daily. Patient not taking: Reported on 01/05/2021    [provider]  diclofenac sodium (VOLTAREN) 1 % GEL Frequency:   Dosage:0   %  Instructions:  Note:APPLY 2 GRAMS TO RIGHT WRIST 4 TIMES A DAY. 02/15/12   [provider]  docusate sodium 100 MG CAPS Take 200 mg by mouth 2 (two) times daily. Patient not taking: Reported on 12/05/2017 01/07/13   Avva, Steva Ready, MD  LORazepam (ATIVAN) 0.5 MG tablet TK 1 T PO BID PRF ANXIETY 11/29/17   [provider]  meclizine (ANTIVERT) 25 MG tablet Take 1 tablet (25 mg total) by mouth 3 (three) times daily as needed for dizziness. Patient not taking: Reported on 12/05/2017 01/18/14   Harvie Heck, PA-C  Multiple Vitamin (MULTIVITAMIN WITH MINERALS) TABS Take 1 tablet by mouth daily.    [provider]   ondansetron (ZOFRAN ODT) 4 MG disintegrating tablet Take 1 tablet (4 mg total) by mouth every 8 (eight) hours as needed for nausea or vomiting. Patient not taking: Reported on 12/19/2017 01/18/14   Harvie Heck, PA-C    Family History Family History  Problem Relation Age of Onset  . Colon cancer Mother   . Lymphoma Mother        non- hodgkins  . COPD Mother   . Melanoma Mother   . Colon polyps Mother   . Ovarian cancer Sister   . Ulcerative colitis Sister   . Colon polyps Sister   . Kidney cancer Father   .  Heart disease Father   . Colon polyps Father   . Breast cancer Sister   . Colon polyps Sister   . Cancer Sister        brain  . Colon polyps Sister   . Colon polyps Brother   . Colon polyps Sister   . Colon polyps Sister   . Colon polyps Brother   . Rectal cancer Neg Hx   . Stomach cancer Neg Hx     Social History Social History   Tobacco Use  . Smoking status: Never Smoker  . Smokeless tobacco: Never Used  Vaping Use  . Vaping Use: Never used  Substance Use Topics  . Alcohol use: Yes    Comment: 01/01/2013 "maybe 1-2 beers/month"  . Drug use: No     Allergies   Buprenorphine hcl and Morphine and related   Review of Systems Review of Systems  Constitutional: Negative for fatigue and fever.  Eyes: Negative for redness, itching and visual disturbance.  Respiratory: Negative for shortness of breath.   Cardiovascular: Negative for chest pain and leg swelling.  Gastrointestinal: Negative for nausea and vomiting.  Musculoskeletal: Positive for arthralgias and joint swelling. Negative for myalgias.  Skin: Positive for color change. Negative for rash and wound.  Neurological: Negative for dizziness, syncope, weakness, light-headedness and headaches.     Physical Exam Triage Vital Signs ED Triage Vitals  Enc Vitals Group     BP 01/05/21 1155 117/83     Pulse Rate 01/05/21 1155 84     Resp 01/05/21 1155 20     Temp 01/05/21 1155 98.4 F (36.9 C)      Temp Source 01/05/21 1155 Oral     SpO2 01/05/21 1155 96 %     Weight --      Height --      Head Circumference --      Peak Flow --      Pain Score 01/05/21 1152 10     Pain Loc --      Pain Edu? --      Excl. in [place]? --    No data found.  Updated Vital Signs BP 117/83 (BP Location: Left Arm)   Pulse 84   Temp 98.4 F (36.9 C) (Oral)   Resp 20   SpO2 96%   Visual Acuity Right Eye Distance:   Left Eye Distance:   Bilateral Distance:    Right Eye Near:   Left Eye Near:    Bilateral Near:     Physical Exam Vitals and nursing note reviewed.  Constitutional:      Appearance: He is well-developed.     Comments: No acute distress  HENT:     Head: Normocephalic and atraumatic.     Nose: Nose normal.  Eyes:     Conjunctiva/sclera: Conjunctivae normal.  Cardiovascular:     Rate and Rhythm: Normal rate.  Pulmonary:     Effort: Pulmonary effort is normal. No respiratory distress.  Abdominal:     General: There is no distension.  Musculoskeletal:        General: Normal range of motion.     Cervical back: Neck supple.  Skin:    General: Skin is warm and dry.     Comments: Right great toenail with erythema swelling and dried drainage noted to medial nail fold and proximal nail fold, associated tenderness,  Neurological:     Mental Status: He is alert and oriented to person, place, and time.  UC Treatments / Results  Labs (all labs ordered are listed, but only abnormal results are displayed) Labs Reviewed - No data to display  EKG   Radiology No results found.  Procedures Procedures (including critical care time)  Medications Ordered in UC Medications - No data to display  Initial Impression / Assessment and Plan / UC Course  I have reviewed the triage vital signs and the nursing notes.  Pertinent labs & imaging results that were available during my care of the patient were reviewed by me and considered in my medical decision making (see chart for  details).     Paronychia likely secondary to ingrown toenail-treating for paronychia with Keflex and Bactroban, anti-inflammatories for pain.  Recommended return for follow-up either here or with podiatry for further treatment of ingrown toenail if still causing pain/problems after clearance of infection.  Recommended deferring any further intervention of ingrown at this time to clear infection first.  Discussed strict return precautions. Patient verbalized understanding and is agreeable with plan.  Final Clinical Impressions(s) / UC Diagnoses   Final diagnoses:  Paronychia of great toe, right  Ingrown right big toenail     Discharge Instructions     Keflex 4 times daily for the next week Warm soaks with foot, dry well and apply Bactroban around nail bed Tylenol and ibuprofen for pain, take with food Follow-up here or [facility] if still having pain related to ingrown nail    ED Prescriptions    Medication Sig Dispense Auth. Provider   cephALEXin (KEFLEX) 500 MG capsule Take 1 capsule (500 mg total) by mouth 4 (four) times daily for 7 days. 28 capsule Dusty Raczkowski C, PA-C   mupirocin ointment (BACTROBAN) 2 % Apply 1 application topically 2 (two) times daily. 30 g Abdo Denault C, PA-C   ibuprofen (ADVIL) 800 MG tablet Take 1 tablet (800 mg total) by mouth 3 (three) times daily. 21 tablet Meenakshi Sazama, [place] C, PA-C     PDMP not reviewed this encounter.   Rolondo Pierre, [place] C, PA-C 01/05/21 1301

## 2021-01-05 NOTE — Discharge Instructions (Signed)
Keflex 4 times daily for the next week Warm soaks with foot, dry well and apply Bactroban around nail bed Tylenol and ibuprofen for pain, take with food Follow-up here or [facility] if still having pain related to ingrown nail

## 2021-02-11 ENCOUNTER — Inpatient Hospital Stay (HOSPITAL_COMMUNITY): Payer: Managed Care, Other (non HMO)

## 2021-02-11 ENCOUNTER — Emergency Department (HOSPITAL_COMMUNITY): Payer: Managed Care, Other (non HMO)

## 2021-02-11 ENCOUNTER — Encounter (HOSPITAL_COMMUNITY): Payer: Self-pay

## 2021-02-11 ENCOUNTER — Inpatient Hospital Stay (HOSPITAL_COMMUNITY)
Admission: EM | Admit: 2021-02-11 | Discharge: 2021-02-13 | DRG: 287 | Disposition: A | Discharge status: Home / Self Care | Payer: Managed Care, Other (non HMO) | Attending: Cardiology | Admitting: Cardiology

## 2021-02-11 ENCOUNTER — Other Ambulatory Visit: Payer: Self-pay

## 2021-02-11 ENCOUNTER — Inpatient Hospital Stay (HOSPITAL_COMMUNITY)
Admission: EM | Disposition: A | Discharge status: Home / Self Care | Payer: Self-pay | Source: Home / Self Care | Attending: Cardiology

## 2021-02-11 DIAGNOSIS — Z8711 Personal history of peptic ulcer disease: Secondary | ICD-10-CM | POA: Diagnosis not present

## 2021-02-11 DIAGNOSIS — Z825 Family history of asthma and other chronic lower respiratory diseases: Secondary | ICD-10-CM | POA: Diagnosis not present

## 2021-02-11 DIAGNOSIS — I309 Acute pericarditis, unspecified: Secondary | ICD-10-CM | POA: Diagnosis present

## 2021-02-11 DIAGNOSIS — Z20822 Contact with and (suspected) exposure to covid-19: Secondary | ICD-10-CM | POA: Diagnosis present

## 2021-02-11 DIAGNOSIS — Z7982 Long term (current) use of aspirin: Secondary | ICD-10-CM | POA: Diagnosis not present

## 2021-02-11 DIAGNOSIS — R9431 Abnormal electrocardiogram [ECG] [EKG]: Secondary | ICD-10-CM

## 2021-02-11 DIAGNOSIS — R072 Precordial pain: Secondary | ICD-10-CM

## 2021-02-11 DIAGNOSIS — Z888 Allergy status to other drugs, medicaments and biological substances status: Secondary | ICD-10-CM | POA: Diagnosis not present

## 2021-02-11 DIAGNOSIS — D72829 Elevated white blood cell count, unspecified: Secondary | ICD-10-CM | POA: Diagnosis present

## 2021-02-11 DIAGNOSIS — Z800 Family history of malignant neoplasm of digestive organs: Secondary | ICD-10-CM

## 2021-02-11 DIAGNOSIS — Z8371 Family history of colonic polyps: Secondary | ICD-10-CM | POA: Diagnosis not present

## 2021-02-11 DIAGNOSIS — Z808 Family history of malignant neoplasm of other organs or systems: Secondary | ICD-10-CM

## 2021-02-11 DIAGNOSIS — I341 Nonrheumatic mitral (valve) prolapse: Secondary | ICD-10-CM | POA: Diagnosis present

## 2021-02-11 DIAGNOSIS — I3139 Other pericardial effusion (noninflammatory): Secondary | ICD-10-CM

## 2021-02-11 DIAGNOSIS — K219 Gastro-esophageal reflux disease without esophagitis: Secondary | ICD-10-CM | POA: Diagnosis present

## 2021-02-11 DIAGNOSIS — I213 ST elevation (STEMI) myocardial infarction of unspecified site: Secondary | ICD-10-CM | POA: Diagnosis present

## 2021-02-11 DIAGNOSIS — Z807 Family history of other malignant neoplasms of lymphoid, hematopoietic and related tissues: Secondary | ICD-10-CM

## 2021-02-11 DIAGNOSIS — Z803 Family history of malignant neoplasm of breast: Secondary | ICD-10-CM | POA: Diagnosis not present

## 2021-02-11 DIAGNOSIS — Z86711 Personal history of pulmonary embolism: Secondary | ICD-10-CM

## 2021-02-11 DIAGNOSIS — Z8051 Family history of malignant neoplasm of kidney: Secondary | ICD-10-CM | POA: Diagnosis not present

## 2021-02-11 DIAGNOSIS — Z885 Allergy status to narcotic agent status: Secondary | ICD-10-CM | POA: Diagnosis not present

## 2021-02-11 DIAGNOSIS — Z8041 Family history of malignant neoplasm of ovary: Secondary | ICD-10-CM

## 2021-02-11 DIAGNOSIS — Z79899 Other long term (current) drug therapy: Secondary | ICD-10-CM | POA: Diagnosis not present

## 2021-02-11 DIAGNOSIS — F410 Panic disorder [episodic paroxysmal anxiety] without agoraphobia: Secondary | ICD-10-CM | POA: Diagnosis present

## 2021-02-11 DIAGNOSIS — E785 Hyperlipidemia, unspecified: Secondary | ICD-10-CM | POA: Diagnosis present

## 2021-02-11 DIAGNOSIS — I319 Disease of pericardium, unspecified: Secondary | ICD-10-CM | POA: Diagnosis present

## 2021-02-11 DIAGNOSIS — Z981 Arthrodesis status: Secondary | ICD-10-CM

## 2021-02-11 DIAGNOSIS — Z8249 Family history of ischemic heart disease and other diseases of the circulatory system: Secondary | ICD-10-CM

## 2021-02-11 DIAGNOSIS — G4733 Obstructive sleep apnea (adult) (pediatric): Secondary | ICD-10-CM | POA: Diagnosis present

## 2021-02-11 DIAGNOSIS — J9811 Atelectasis: Secondary | ICD-10-CM | POA: Diagnosis present

## 2021-02-11 HISTORY — PX: THORACIC AORTOGRAM: CATH118269

## 2021-02-11 HISTORY — PX: LEFT HEART CATH AND CORONARY ANGIOGRAPHY: CATH118249

## 2021-02-11 LAB — COMPREHENSIVE METABOLIC PANEL
ALT: 25 U/L (ref 0–44)
AST: 26 U/L (ref 15–41)
Albumin: 3.8 g/dL (ref 3.5–5.0)
Alkaline Phosphatase: 108 U/L (ref 38–126)
Anion gap: 12 (ref 5–15)
BUN: 14 mg/dL (ref 6–20)
CO2: 23 mmol/L (ref 22–32)
Calcium: 9.1 mg/dL (ref 8.9–10.3)
Chloride: 100 mmol/L (ref 98–111)
Creatinine, Ser: 1.12 mg/dL (ref 0.61–1.24)
GFR, Estimated: >60 mL/min (ref >60)
Glucose, Bld: 114 mg/dL — ABNORMAL HIGH (ref 70–99)
Potassium: 4.2 mmol/L (ref 3.5–5.1)
Sodium: 135 mmol/L (ref 135–145)
Total Bilirubin: 2 mg/dL — ABNORMAL HIGH (ref 0.3–1.2)
Total Protein: 7.6 g/dL (ref 6.5–8.1)

## 2021-02-11 LAB — PROTIME-INR
INR: 1.2 (ref 0.8–1.2)
Prothrombin Time: 15.2 seconds (ref 11.4–15.2)

## 2021-02-11 LAB — LIPID PANEL
Cholesterol: 114 mg/dL (ref 0–200)
HDL: 40 mg/dL — ABNORMAL LOW (ref >40)
LDL Cholesterol: 61 mg/dL (ref 0–99)
Total CHOL/HDL Ratio: 2.9 RATIO
Triglycerides: 63 mg/dL (ref <150)
VLDL: 13 mg/dL (ref 0–40)

## 2021-02-11 LAB — CBC
HCT: 45 % (ref 39.0–52.0)
HCT: 43.5 % (ref 39.0–52.0)
Hemoglobin: 14.7 g/dL (ref 13.0–17.0)
Hemoglobin: 14.1 g/dL (ref 13.0–17.0)
MCH: 29.2 pg (ref 26.0–34.0)
MCH: 28.8 pg (ref 26.0–34.0)
MCHC: 32.7 g/dL (ref 30.0–36.0)
MCHC: 32.4 g/dL (ref 30.0–36.0)
MCV: 89.5 fL (ref 80.0–100.0)
MCV: 88.8 fL (ref 80.0–100.0)
Platelets: 240 10*3/uL (ref 150–400)
Platelets: 206 10*3/uL (ref 150–400)
RBC: 5.03 MIL/uL (ref 4.22–5.81)
RBC: 4.9 MIL/uL (ref 4.22–5.81)
RDW: 14.1 % (ref 11.5–15.5)
RDW: 14 % (ref 11.5–15.5)
WBC: 16 10*3/uL — ABNORMAL HIGH (ref 4.0–10.5)
WBC: 16 10*3/uL — ABNORMAL HIGH (ref 4.0–10.5)
nRBC: 0 % (ref 0.0–0.2)
nRBC: 0 % (ref 0.0–0.2)

## 2021-02-11 LAB — HEMOGLOBIN A1C
Hgb A1c MFr Bld: 5.6 % (ref 4.8–5.6)
Mean Plasma Glucose: 114 mg/dL

## 2021-02-11 LAB — APTT: aPTT: 29 seconds (ref 24–36)

## 2021-02-11 LAB — CREATININE, SERUM
Creatinine, Ser: 1 mg/dL (ref 0.61–1.24)
GFR, Estimated: >60 mL/min (ref >60)

## 2021-02-11 LAB — RESP PANEL BY RT-PCR (FLU A&B, COVID) ARPGX2
Influenza A by PCR: NEGATIVE
Influenza B by PCR: NEGATIVE
SARS Coronavirus 2 by RT PCR: NEGATIVE

## 2021-02-11 LAB — HIV ANTIBODY (ROUTINE TESTING W REFLEX): HIV Screen 4th Generation wRfx: NONREACTIVE

## 2021-02-11 LAB — TROPONIN I (HIGH SENSITIVITY)
Troponin I (High Sensitivity): 6 ng/L (ref <18)
Troponin I (High Sensitivity): 6 ng/L (ref <18)

## 2021-02-11 LAB — D-DIMER, QUANTITATIVE: D-Dimer, Quant: 0.56 ug/mL-FEU — ABNORMAL HIGH (ref 0.00–0.50)

## 2021-02-11 SURGERY — LEFT HEART CATH AND CORONARY ANGIOGRAPHY
Anesthesia: LOCAL

## 2021-02-11 MED ORDER — FENTANYL CITRATE (PF) 100 MCG/2ML IJ SOLN
INTRAMUSCULAR | Status: AC
  Filled 2021-02-11: qty 2

## 2021-02-11 MED ORDER — VERAPAMIL HCL 2.5 MG/ML IV SOLN
INTRAVENOUS | Status: AC
  Filled 2021-02-11: qty 2

## 2021-02-11 MED ORDER — HYDRALAZINE HCL 20 MG/ML IJ SOLN: 10.0000 mg | INTRAMUSCULAR | Status: AC | PRN

## 2021-02-11 MED ORDER — HEPARIN SODIUM (PORCINE) 5000 UNIT/ML IJ SOLN
5000.0000 [IU] | Freq: Three times a day (TID) | INTRAMUSCULAR | Status: DC
  Administered 2021-02-11 – 2021-02-13 (×5): 5000 [IU] via SUBCUTANEOUS
  Filled 2021-02-11 (×5): qty 1

## 2021-02-11 MED ORDER — HEPARIN SODIUM (PORCINE) 5000 UNIT/ML IJ SOLN: 5000.0000 [IU] | Freq: Three times a day (TID) | INTRAMUSCULAR | Status: DC

## 2021-02-11 MED ORDER — SODIUM CHLORIDE 0.9 % IV SOLN: INTRAVENOUS | Status: DC

## 2021-02-11 MED ORDER — ONDANSETRON HCL 4 MG/2ML IJ SOLN
INTRAMUSCULAR | Status: AC
  Filled 2021-02-11: qty 2

## 2021-02-11 MED ORDER — MIDAZOLAM HCL 2 MG/2ML IJ SOLN
INTRAMUSCULAR | Status: DC | PRN
  Administered 2021-02-11: 1 mg via INTRAVENOUS

## 2021-02-11 MED ORDER — HEPARIN SODIUM (PORCINE) 5000 UNIT/ML IJ SOLN
INTRAMUSCULAR | Status: AC
  Administered 2021-02-11: 4000 [IU] via INTRAVENOUS
  Filled 2021-02-11: qty 1

## 2021-02-11 MED ORDER — HEPARIN (PORCINE) IN NACL 1000-0.9 UT/500ML-% IV SOLN
INTRAVENOUS | Status: AC
  Filled 2021-02-11: qty 1000

## 2021-02-11 MED ORDER — ONDANSETRON HCL 4 MG/2ML IJ SOLN: 4.0000 mg | Freq: Four times a day (QID) | INTRAMUSCULAR | Status: DC | PRN

## 2021-02-11 MED ORDER — IBUPROFEN 600 MG PO TABS
800.0000 mg | ORAL_TABLET | Freq: Four times a day (QID) | ORAL | Status: DC | PRN
  Administered 2021-02-11 – 2021-02-12 (×3): 800 mg via ORAL
  Filled 2021-02-11 (×4): qty 1

## 2021-02-11 MED ORDER — VERAPAMIL HCL 2.5 MG/ML IV SOLN
INTRAVENOUS | Status: DC | PRN
  Administered 2021-02-11: 10 mL via INTRA_ARTERIAL

## 2021-02-11 MED ORDER — PANTOPRAZOLE SODIUM 40 MG PO TBEC
40.0000 mg | DELAYED_RELEASE_TABLET | Freq: Every day | ORAL | Status: DC
  Administered 2021-02-11 – 2021-02-13 (×3): 40 mg via ORAL
  Filled 2021-02-11 (×3): qty 1

## 2021-02-11 MED ORDER — SODIUM CHLORIDE 0.9% FLUSH: 3.0000 mL | INTRAVENOUS | Status: DC | PRN

## 2021-02-11 MED ORDER — IOHEXOL 350 MG/ML SOLN
80.0000 mL | Freq: Once | INTRAVENOUS | Status: AC | PRN
  Administered 2021-02-11: 80 mL via INTRAVENOUS

## 2021-02-11 MED ORDER — ACETAMINOPHEN 325 MG PO TABS
650.0000 mg | ORAL_TABLET | ORAL | Status: DC | PRN
  Administered 2021-02-12: 650 mg via ORAL
  Filled 2021-02-11: qty 2

## 2021-02-11 MED ORDER — COLCHICINE 0.6 MG PO TABS
0.6000 mg | ORAL_TABLET | Freq: Two times a day (BID) | ORAL | Status: DC
  Administered 2021-02-11 – 2021-02-13 (×5): 0.6 mg via ORAL
  Filled 2021-02-11 (×5): qty 1

## 2021-02-11 MED ORDER — IOHEXOL 350 MG/ML SOLN
INTRAVENOUS | Status: DC | PRN
  Administered 2021-02-11: 50 mL

## 2021-02-11 MED ORDER — ASPIRIN 81 MG PO CHEW
324.0000 mg | CHEWABLE_TABLET | Freq: Once | ORAL | Status: AC
  Administered 2021-02-11: 324 mg via ORAL
  Filled 2021-02-11: qty 4

## 2021-02-11 MED ORDER — SODIUM CHLORIDE 0.9 % IV SOLN: 250.0000 mL | INTRAVENOUS | Status: DC | PRN

## 2021-02-11 MED ORDER — SODIUM CHLORIDE 0.9 % IV SOLN
INTRAVENOUS | Status: AC | PRN
  Administered 2021-02-11: 50 mL/h via INTRAVENOUS

## 2021-02-11 MED ORDER — LIDOCAINE HCL (PF) 1 % IJ SOLN
INTRAMUSCULAR | Status: AC
  Filled 2021-02-11: qty 30

## 2021-02-11 MED ORDER — HEPARIN (PORCINE) IN NACL 1000-0.9 UT/500ML-% IV SOLN
INTRAVENOUS | Status: DC | PRN
  Administered 2021-02-11 (×2): 500 mL

## 2021-02-11 MED ORDER — LABETALOL HCL 5 MG/ML IV SOLN: 10.0000 mg | INTRAVENOUS | Status: AC | PRN

## 2021-02-11 MED ORDER — SODIUM CHLORIDE 0.9% FLUSH
3.0000 mL | Freq: Two times a day (BID) | INTRAVENOUS | Status: DC
  Administered 2021-02-12 – 2021-02-13 (×3): 3 mL via INTRAVENOUS

## 2021-02-11 MED ORDER — LIDOCAINE HCL (PF) 1 % IJ SOLN
INTRAMUSCULAR | Status: DC | PRN
  Administered 2021-02-11: 2 mL

## 2021-02-11 MED ORDER — SERTRALINE HCL 100 MG PO TABS
100.0000 mg | ORAL_TABLET | Freq: Every day | ORAL | Status: DC
  Administered 2021-02-11 – 2021-02-12 (×2): 100 mg via ORAL
  Filled 2021-02-11 (×2): qty 1

## 2021-02-11 MED ORDER — HEPARIN SODIUM (PORCINE) 5000 UNIT/ML IJ SOLN: 4000.0000 [IU] | Freq: Once | INTRAMUSCULAR | Status: AC

## 2021-02-11 MED ORDER — MIDAZOLAM HCL 2 MG/2ML IJ SOLN
INTRAMUSCULAR | Status: AC
  Filled 2021-02-11: qty 2

## 2021-02-11 MED ORDER — ONDANSETRON HCL 4 MG/2ML IJ SOLN
4.0000 mg | Freq: Once | INTRAMUSCULAR | Status: AC
  Administered 2021-02-11: 4 mg via INTRAVENOUS

## 2021-02-11 MED ORDER — FENTANYL CITRATE (PF) 100 MCG/2ML IJ SOLN
INTRAMUSCULAR | Status: DC | PRN
  Administered 2021-02-11: 25 ug via INTRAVENOUS

## 2021-02-11 MED ORDER — ATORVASTATIN CALCIUM 10 MG PO TABS
10.0000 mg | ORAL_TABLET | Freq: Every day | ORAL | Status: DC
  Administered 2021-02-11 – 2021-02-13 (×3): 10 mg via ORAL
  Filled 2021-02-11 (×3): qty 1

## 2021-02-11 MED ORDER — HEPARIN SODIUM (PORCINE) 1000 UNIT/ML IJ SOLN
INTRAMUSCULAR | Status: AC
  Filled 2021-02-11: qty 1

## 2021-02-11 SURGICAL SUPPLY — 14 items
CATH INFINITI 5FR ANG PIGTAIL (CATHETERS) ×1 IMPLANT
CATH LAUNCHER 6FR EBU 3 (CATHETERS) ×1 IMPLANT
CATH OPTITORQUE TIG 4.0 5F (CATHETERS) ×1 IMPLANT
DEVICE RAD COMP TR BAND LRG (VASCULAR PRODUCTS) ×1 IMPLANT
GLIDESHEATH SLEND A-KIT 6F 22G (SHEATH) ×1 IMPLANT
GUIDEWIRE INQWIRE 1.5J.035X260 (WIRE) IMPLANT
INQWIRE 1.5J .035X260CM (WIRE) ×4
KIT ENCORE 26 ADVANTAGE (KITS) ×1 IMPLANT
KIT HEART LEFT (KITS) ×2 IMPLANT
PACK CARDIAC CATHETERIZATION (CUSTOM PROCEDURE TRAY) ×2 IMPLANT
TRANSDUCER W/STOPCOCK (MISCELLANEOUS) ×2 IMPLANT
TUBING CIL FLEX 10 FLL-RA (TUBING) ×2 IMPLANT
VALVE COPILOT STAT (MISCELLANEOUS) ×1 IMPLANT
WIRE COUGAR XT STRL 190CM (WIRE) ×1 IMPLANT

## 2021-02-11 NOTE — Progress Notes (Signed)
Back from CT scan

## 2021-02-11 NOTE — ED Notes (Signed)
Cardiology at bedside.

## 2021-02-11 NOTE — Progress Notes (Signed)
   02/11/21 1613  Assess: MEWS Score  Temp 98.2 F (36.8 C)  BP 120/84  Pulse Rate (!) 115  ECG Heart Rate (!) 115  Resp 15  Level of Consciousness Alert  SpO2 95 %  O2 Device Nasal Cannula  O2 Flow Rate (L/min) 2 L/min  Assess: MEWS Score  MEWS Temp 0  MEWS Systolic 0  MEWS Pulse 2  MEWS RR 0  MEWS LOC 0  MEWS Score 2  MEWS Score Color Yellow  Assess: if the MEWS score is Yellow or Red  Were vital signs taken at a resting state? Yes  Focused Assessment No change from prior assessment (new patient)  Early Detection of Sepsis Score *See Row Information* Low  MEWS guidelines implemented *See Row Information* Yes  Treat  MEWS Interventions Escalated (See documentation below)  Take Vital Signs  Increase Vital Sign Frequency  Yellow: Q 2hr X 2 then Q 4hr X 2, if remains yellow, continue Q 4hrs  Escalate  MEWS: Escalate Yellow: discuss with charge nurse/RN and consider discussing with provider and RRT  Notify: Charge Nurse/RN  Name of Charge Nurse/RN Notified Kerrie [place]  Date Charge Nurse/RN Notified 02/11/21  Time Charge Nurse/RN Notified 1617  Document  Patient Outcome Other (Comment) (new admission to floor; no acute changes; patient stable)  Progress note created (see row info) Yes

## 2021-02-11 NOTE — ED Provider Notes (Signed)
[facility] EMERGENCY DEPARTMENT Provider Note   CSN: [id number] Arrival date & time: 02/11/21  0725     History No chief complaint on file.   Paul Griffin is a 55 y.o. male.  HPI Level 5 caveat secondary to urgency intervention EKG brought to me by triage with concern for STEMI Reviewed EKG and Discussed with Dr. Martinique and STEMI initiated 55 year old man presents today complaining of chest pain that began this morning and woke him up.  He is unclear of the time.  No interventions were undertaken.  He states that it is worse with laying back and improved with leaning forward.  He has associated shortness of breath and vomited twice.  He denies any fever or cough.  He has had COVID-vaccine x2 and booster.  He denies any fever, chills, productive cough, sore throat, or exposure to COVID.    Past Medical History:  Diagnosis Date  . Abdominal distension   . Anemia   . Anxiety    panic attacks  . Blood in stool   . Chronic back pain    "neck to sacral" (01/01/2013)  . Clotting disorder ([place])    PE right   . Constipation   . DDD (degenerative disc disease), cervical   . DDD (degenerative disc disease), lumbar   . DDD (degenerative disc disease), lumbosacral   . DDD (degenerative disc disease), thoracolumbar   . Depression   . Diarrhea   . Difficulty urinating   . Dyslipidemia    "good cholesterol isn't as high as dr would like" (01/01/2013)  . Erectile dysfunction    organic  . Family history of anesthesia complication    "Mom; PONV" (01/01/2013)  . Family history of colon cancer   . Generalized headaches    "monthly" (01/01/2013)  . GERD (gastroesophageal reflux disease)   . History of blood transfusion 1980's   "related to bleeding ulcers" (01/01/2013)  . Hoarseness of voice    chronic  . Hyperlipidemia   . Lipoma of arm 1994   right  . Mitral valve prolapse    "mild" (01/01/2013)  . Nasal congestion   . Obstructive sleep apnea    miald w  aith RDI of 6.4 per hour  . Osteoarthritis    "hips & hands" (01/01/2013)  . Palpitations   . Peptic ulcer 1994   severe requiring vagotomy and pyloroplasty  . PONV (postoperative nausea and vomiting)   . Pulmonary embolism on right ([place]) 01/01/2013  . Rectal bleeding   . Shortness of breath    "related to the PE today" (01/01/2013)  . Trouble swallowing   . Tubular adenoma of colon 08/2012  . Ulcer    gastric 1988    Patient Active Problem List   Diagnosis Date Noted  . Pulmonary embolism ([place]) 01/01/2013  . Unspecified constipation 01/01/2013    Class: Acute  . Obstructive sleep apnea 01/01/2013    Class: Chronic  . Hemoptysis 01/01/2013    Class: Acute  . Incisional hernia, without obstruction or gangrene 10/20/2012  . ANXIETY 11/03/2007  . INTERNAL HEMORRHOIDS WITHOUT MENTION COMP 11/03/2007  . EXTERNAL HEMORRHOIDS WITHOUT MENTION COMP 11/03/2007  . RECTAL POLYPS 11/03/2007  . HEMATOCHEZIA 11/03/2007  . ARTHRITIS 11/03/2007  . DUODENAL ULCER, HX OF 11/03/2007    Past Surgical History:  Procedure Laterality Date  . COLONOSCOPY  07/2007   showed polyps, follow up in 2013  . EXCISIONAL HEMORRHOIDECTOMY     "w/colonoscopies" (01/01/2013)  . HERNIA REPAIR  12/25/12  multi inc hernia  . INCISIONAL HERNIA REPAIR N/A 12/25/2012   Procedure: LAPAROSCOPIC REPAIR MULITPLE INCISIONAL HERNIA WITH MESH;  Surgeon: Adin Hector, MD;  Location: [facility];  Service: General;  Laterality: N/A;  . INSERTION OF MESH N/A 12/25/2012   Procedure: INSERTION OF MESH;  Surgeon: Adin Hector, MD;  Location: [facility];  Service: General;  Laterality: N/A;  . LEFT HEART CATHETERIZATION WITH CORONARY ANGIOGRAM N/A 10/15/2013   Procedure: LEFT HEART CATHETERIZATION WITH CORONARY ANGIOGRAM;  Surgeon: Laverda Page, MD;  Location: [facility] CATH LAB;  Service: Cardiovascular;  Laterality: N/A;  . POSTERIOR CERVICAL FUSION/FORAMINOTOMY  2012   fusion  . TONSILLECTOMY  2008  . VAGOTOMY AND PYLOROPLASTY   1988   "partial; for ulcers" (01/01/2013)       Family History  Problem Relation Age of Onset  . Colon cancer Mother   . Lymphoma Mother        non- hodgkins  . COPD Mother   . Melanoma Mother   . Colon polyps Mother   . Ovarian cancer Sister   . Ulcerative colitis Sister   . Colon polyps Sister   . Kidney cancer Father   . Heart disease Father   . Colon polyps Father   . Breast cancer Sister   . Colon polyps Sister   . Cancer Sister        brain  . Colon polyps Sister   . Colon polyps Brother   . Colon polyps Sister   . Colon polyps Sister   . Colon polyps Brother   . Rectal cancer Neg Hx   . Stomach cancer Neg Hx     Social History   Tobacco Use  . Smoking status: Never Smoker  . Smokeless tobacco: Never Used  Vaping Use  . Vaping Use: Never used  Substance Use Topics  . Alcohol use: Yes    Comment: 01/01/2013 "maybe 1-2 beers/month"  . Drug use: No    Home Medications Prior to Admission medications   Medication Sig Start Date End Date Taking? Authorizing Provider  aspirin 81 MG tablet Take 81 mg by mouth daily. Patient not taking: Reported on 01/05/2021    [provider]  atorvastatin (LIPITOR) 10 MG tablet Take 10 mg by mouth daily.    [provider]  celecoxib (CELEBREX) 200 MG capsule Take 200 mg by mouth daily.    [provider]  diclofenac sodium (VOLTAREN) 1 % GEL Frequency:   Dosage:0   %  Instructions:  Note:APPLY 2 GRAMS TO RIGHT WRIST 4 TIMES A DAY. 02/15/12   [provider]  docusate sodium 100 MG CAPS Take 200 mg by mouth 2 (two) times daily. Patient not taking: Reported on 12/05/2017 01/07/13   Avva, Steva Ready, MD  gabapentin (NEURONTIN) 300 MG capsule Take 300 mg by mouth as needed.    [provider]  HYDROcodone-acetaminophen (VICODIN) 5-500 MG per tablet Take 1 tablet by mouth every 6 (six) hours as needed for pain. pain    [provider]  ibuprofen (ADVIL) 800 MG tablet Take 1 tablet (800  mg total) by mouth 3 (three) times daily. 01/05/21   Wieters, Hallie C, PA-C  LORazepam (ATIVAN) 0.5 MG tablet TK 1 T PO BID PRF ANXIETY 11/29/17   [provider]  meclizine (ANTIVERT) 25 MG tablet Take 1 tablet (25 mg total) by mouth 3 (three) times daily as needed for dizziness. Patient not taking: Reported on 12/05/2017 01/18/14   Harvie Heck, PA-C  metaxalone (SKELAXIN) 800 MG tablet Take 800 mg by mouth 3 (three) times daily as needed for pain.     [provider]  Multiple Vitamin (MULTIVITAMIN WITH MINERALS) TABS Take 1 tablet by mouth daily.    [provider]  mupirocin ointment (BACTROBAN) 2 % Apply 1 application topically 2 (two) times daily. 01/05/21   Wieters, Hallie C, PA-C  omeprazole (PRILOSEC) 40 MG capsule Take 1 capsule (40 mg total) by mouth daily. 11/06/12   Ladene Artist, MD  ondansetron (ZOFRAN ODT) 4 MG disintegrating tablet Take 1 tablet (4 mg total) by mouth every 8 (eight) hours as needed for nausea or vomiting. Patient not taking: Reported on 12/19/2017 01/18/14   Harvie Heck, PA-C  sertraline (ZOLOFT) 100 MG tablet Take 100 mg by mouth Daily.  08/08/12   [provider]    Allergies    Buprenorphine hcl and Morphine and related  Review of Systems   Review of Systems  All other systems reviewed and are negative.   Physical Exam Updated Vital Signs BP 121/80 (BP Location: Left Arm)   Pulse (!) 117   Temp 99.8 F (37.7 C)   Resp 18   Wt 104.3 kg   SpO2 96%   BMI 32.08 kg/m   Physical Exam Vitals and nursing note reviewed.  Constitutional:      General: He is in acute distress.     Appearance: He is ill-appearing.  HENT:     Head: Normocephalic.     Right Ear: External ear normal.     Left Ear: External ear normal.     Nose: Nose normal.     Mouth/Throat:     Pharynx: Oropharynx is clear.  Cardiovascular:     Rate and Rhythm: Regular rhythm. Tachycardia present.     Pulses: Normal pulses.     Heart sounds:  Normal heart sounds.  Pulmonary:     Effort: Pulmonary effort is normal.     Breath sounds: Normal breath sounds.  Abdominal:     General: Abdomen is flat.     Palpations: Abdomen is soft.     Comments: Midline scar well-healed  Musculoskeletal:        General: Normal range of motion.     Cervical back: Normal range of motion.  Skin:    Capillary Refill: Capillary refill takes less than 2 seconds.     Comments: Patient is diaphoretic on exam  Neurological:     General: No focal deficit present.     Mental Status: He is alert.  Psychiatric:        Mood and Affect: Mood normal.     ED Results / Procedures / Treatments   Labs (all labs ordered are listed, but only abnormal results are displayed) Labs Reviewed  RESP PANEL BY RT-PCR (FLU A&B, COVID) ARPGX2  CBC  HEMOGLOBIN A1C  PROTIME-INR  APTT  COMPREHENSIVE METABOLIC PANEL  LIPID PANEL  TROPONIN I (HIGH SENSITIVITY)    EKG EKG Interpretation  Date/Time:  Wednesday February 11 2021 07:33:41 EDT Ventricular Rate:  118 PR Interval:  128 QRS Duration: 72 QT Interval:  318 QTC Calculation: 445 R Axis:   45 Text Interpretation: Sinus tachycardia ST elevation consider inferior injury or acute infarct ** ** ACUTE MI / STEMI ** ** Abnormal ECG Confirmed by Pattricia Boss [phone]) on 02/11/2021 7:38:10 AM   Radiology No results found.  Procedures Procedures   Medications Ordered in ED Medications  0.9 %  sodium chloride infusion (has  no administration in time range)  aspirin chewable tablet 324 mg (has no administration in time range)  heparin injection 4,000 Units (has no administration in time range)  heparin 5000 UNIT/ML injection (has no administration in time range)  ondansetron (ZOFRAN) injection 4 mg (has no administration in time range)    ED Course  I have reviewed the triage vital signs and the nursing notes.  Pertinent labs & imaging results that were available during my care of the patient were reviewed by  me and considered in my medical decision making (see chart for details).    MDM Rules/Calculators/A&P                         Patient is placed on monitor.  STEMI order set initiated.  Aspirin heparin given per pharmacy.  Patient is nauseated given Zofran. Cath lab team in ED and patient being transported to cath lab now. 7:58 AM  Final Clinical Impression(s) / ED Diagnoses Final diagnoses:  ST elevation myocardial infarction (STEMI), unspecified artery [facility])    Rx / DC Orders ED Discharge Orders    None       Pattricia Boss, MD 02/11/21 [phone]

## 2021-02-11 NOTE — H&P (Addendum)
Paul Griffin is an 55 y.o. male.   Chief Complaint: Chest pain HPI:   55 year old Caucasian male with remote history of peptic ulcer, h/o post op PE in 2018, now presented with acute chest pain and diaphoresis.  Pain is 7/10 in the center of her chest radiating to back, worse with deep breathing.  EKG showed inferior ST elevation.  He was taken emergently to Cath Lab.  Coronary angiogram showed normal coronary arteries without any occlusion.  LV gram showed relatively preserved LV EF, aortogram showed no obvious evidence of dissection.  Past Medical History:  Diagnosis Date  . Abdominal distension   . Anemia   . Anxiety    panic attacks  . Blood in stool   . Chronic back pain    "neck to sacral" (01/01/2013)  . Clotting disorder ([place])    PE right   . Constipation   . DDD (degenerative disc disease), cervical   . DDD (degenerative disc disease), lumbar   . DDD (degenerative disc disease), lumbosacral   . DDD (degenerative disc disease), thoracolumbar   . Depression   . Diarrhea   . Difficulty urinating   . Dyslipidemia    "good cholesterol isn't as high as dr would like" (01/01/2013)  . Erectile dysfunction    organic  . Family history of anesthesia complication    "Mom; PONV" (01/01/2013)  . Family history of colon cancer   . Generalized headaches    "monthly" (01/01/2013)  . GERD (gastroesophageal reflux disease)   . History of blood transfusion 1980's   "related to bleeding ulcers" (01/01/2013)  . Hoarseness of voice    chronic  . Hyperlipidemia   . Lipoma of arm 1994   right  . Mitral valve prolapse    "mild" (01/01/2013)  . Nasal congestion   . Obstructive sleep apnea    miald w aith RDI of 6.4 per hour  . Osteoarthritis    "hips & hands" (01/01/2013)  . Palpitations   . Peptic ulcer 1994   severe requiring vagotomy and pyloroplasty  . PONV (postoperative nausea and vomiting)   . Pulmonary embolism on right ([place]) 01/01/2013  . Rectal bleeding   . Shortness  of breath    "related to the PE today" (01/01/2013)  . Trouble swallowing   . Tubular adenoma of colon 08/2012  . Ulcer    gastric 1988    Past Surgical History:  Procedure Laterality Date  . COLONOSCOPY  07/2007   showed polyps, follow up in 2013  . EXCISIONAL HEMORRHOIDECTOMY     "w/colonoscopies" (01/01/2013)  . HERNIA REPAIR  12/25/12   multi inc hernia  . INCISIONAL HERNIA REPAIR N/A 12/25/2012   Procedure: LAPAROSCOPIC REPAIR MULITPLE INCISIONAL HERNIA WITH MESH;  Surgeon: Adin Hector, MD;  Location: [facility];  Service: General;  Laterality: N/A;  . INSERTION OF MESH N/A 12/25/2012   Procedure: INSERTION OF MESH;  Surgeon: Adin Hector, MD;  Location: [facility];  Service: General;  Laterality: N/A;  . LEFT HEART CATHETERIZATION WITH CORONARY ANGIOGRAM N/A 10/15/2013   Procedure: LEFT HEART CATHETERIZATION WITH CORONARY ANGIOGRAM;  Surgeon: Laverda Page, MD;  Location: [facility] CATH LAB;  Service: Cardiovascular;  Laterality: N/A;  . POSTERIOR CERVICAL FUSION/FORAMINOTOMY  2012   fusion  . TONSILLECTOMY  2008  . VAGOTOMY AND PYLOROPLASTY  1988   "partial; for ulcers" (01/01/2013)     Family History  Problem Relation Age of Onset  . Colon cancer Mother   . Lymphoma Mother  non- hodgkins  . COPD Mother   . Melanoma Mother   . Colon polyps Mother   . Ovarian cancer Sister   . Ulcerative colitis Sister   . Colon polyps Sister   . Kidney cancer Father   . Heart disease Father   . Colon polyps Father   . Breast cancer Sister   . Colon polyps Sister   . Cancer Sister        brain  . Colon polyps Sister   . Colon polyps Brother   . Colon polyps Sister   . Colon polyps Sister   . Colon polyps Brother   . Rectal cancer Neg Hx   . Stomach cancer Neg Hx     Social History:  reports that he has never smoked. He has never used smokeless tobacco. He reports current alcohol use. He reports that he does not use drugs.  Allergies:  Allergies  Allergen Reactions  .  Buprenorphine Hcl Other (See Comments)    hallucinations  . Morphine And Related Other (See Comments)    hallucinations    Review of Systems  Constitutional: Positive for diaphoresis. Negative for decreased appetite, malaise/fatigue, weight gain and weight loss.  HENT: Negative for congestion.   Eyes: Negative for visual disturbance.  Cardiovascular: Positive for chest pain. Negative for dyspnea on exertion, leg swelling, palpitations and syncope.  Respiratory: Positive for cough and shortness of breath.   Endocrine: Negative for cold intolerance.  Hematologic/Lymphatic: Does not bruise/bleed easily.  Skin: Negative for itching and rash.  Musculoskeletal: Positive for back pain. Negative for myalgias.  Gastrointestinal: Negative for abdominal pain, nausea and vomiting.  Genitourinary: Negative for dysuria.  Neurological: Negative for dizziness and weakness.  Psychiatric/Behavioral: The patient is not nervous/anxious.   All other systems reviewed and are negative.    Blood pressure 123/86, pulse (!) 109, temperature 99.8 F (37.7 C), resp. rate (!) 32, weight 104.3 kg, SpO2 93 %. Body mass index is 32.08 kg/m.  Physical Exam Vitals and nursing note reviewed.  Constitutional:      General: He is in acute distress.     Appearance: He is well-developed. He is diaphoretic.  HENT:     Head: Normocephalic and atraumatic.  Eyes:     Conjunctiva/sclera: Conjunctivae normal.     Pupils: Pupils are equal, round, and reactive to light.  Neck:     Vascular: No JVD.  Cardiovascular:     Rate and Rhythm: Regular rhythm. Tachycardia present.     Pulses: Normal pulses and intact distal pulses.     Heart sounds: No murmur heard.   Pulmonary:     Effort: Pulmonary effort is normal.     Breath sounds: Normal breath sounds. No wheezing or rales.  Abdominal:     General: Bowel sounds are normal.     Palpations: Abdomen is soft.     Tenderness: There is no rebound.  Musculoskeletal:         General: No tenderness. Normal range of motion.     Right lower leg: No edema.     Left lower leg: No edema.  Lymphadenopathy:     Cervical: No cervical adenopathy.  Skin:    General: Skin is warm.  Neurological:     Mental Status: He is alert and oriented to person, place, and time.     Cranial Nerves: No cranial nerve deficit.    Labs:   Lab Results  Component Value Date   WBC 8.6 01/18/2014   HGB 15.0  01/18/2014   HCT 43.9 01/18/2014   MCV 90.1 01/18/2014   PLT 186 01/18/2014   No results for input(s): NA, K, CL, CO2, BUN, CREATININE, CALCIUM, PROT, BILITOT, ALKPHOS, ALT, AST, GLUCOSE in the last 168 hours.  Invalid input(s): LABALBU  Lipid Panel  No results found for: CHOL, TRIG, HDL, CHOLHDL, VLDL, LDLCALC  BNP (last 3 results) No results for input(s): BNP in the last 8760 hours.  HEMOGLOBIN A1C No results found for: HGBA1C, MPG  Cardiac Panel (last 3 results) No results for input(s): CKTOTAL, CKMB, RELINDX in the last 8760 hours.  Invalid input(s): TROPONINHS  No results found for: CKTOTAL, CKMB, CKMBINDEX   TSH No results for input(s): TSH in the last 8760 hours.   Facility-Administered Medications Prior to Admission  Medication Dose Route Frequency Provider Last Rate Last Admin  . 0.9 %  sodium chloride infusion  500 mL Intravenous Once Ladene Artist, MD       Medications Prior to Admission  Medication Sig Dispense Refill  . aspirin 81 MG tablet Take 81 mg by mouth daily. (Patient not taking: Reported on 01/05/2021)    . atorvastatin (LIPITOR) 10 MG tablet Take 10 mg by mouth daily.    . celecoxib (CELEBREX) 200 MG capsule Take 200 mg by mouth daily.    . diclofenac sodium (VOLTAREN) 1 % GEL Frequency:   Dosage:0   %  Instructions:  Note:APPLY 2 GRAMS TO RIGHT WRIST 4 TIMES A DAY.    [name] docusate sodium 100 MG CAPS Take 200 mg by mouth 2 (two) times daily. (Patient not taking: Reported on 12/05/2017) 60 capsule 0  . gabapentin (NEURONTIN) 300 MG  capsule Take 300 mg by mouth as needed.    [name] HYDROcodone-acetaminophen (VICODIN) 5-500 MG per tablet Take 1 tablet by mouth every 6 (six) hours as needed for pain. pain    . ibuprofen (ADVIL) 800 MG tablet Take 1 tablet (800 mg total) by mouth 3 (three) times daily. 21 tablet 0  . LORazepam (ATIVAN) 0.5 MG tablet TK 1 T PO BID PRF ANXIETY  3  . meclizine (ANTIVERT) 25 MG tablet Take 1 tablet (25 mg total) by mouth 3 (three) times daily as needed for dizziness. (Patient not taking: Reported on 12/05/2017) 30 tablet 0  . metaxalone (SKELAXIN) 800 MG tablet Take 800 mg by mouth 3 (three) times daily as needed for pain.     . Multiple Vitamin (MULTIVITAMIN WITH MINERALS) TABS Take 1 tablet by mouth daily.    . mupirocin ointment (BACTROBAN) 2 % Apply 1 application topically 2 (two) times daily. 30 g 0  . omeprazole (PRILOSEC) 40 MG capsule Take 1 capsule (40 mg total) by mouth daily. 30 capsule 11  . ondansetron (ZOFRAN ODT) 4 MG disintegrating tablet Take 1 tablet (4 mg total) by mouth every 8 (eight) hours as needed for nausea or vomiting. (Patient not taking: Reported on 12/19/2017) 10 tablet 0  . sertraline (ZOLOFT) 100 MG tablet Take 100 mg by mouth Daily.         Current Facility-Administered Medications:  .  0.9 %  sodium chloride infusion, , Intravenous, Continuous, Ray, Danielle, MD .  ondansetron [facility]) 4 MG/2ML injection, , , ,    Today's Vitals   02/11/21 0735 02/11/21 0743 02/11/21 0756  BP:  121/80 123/86  Pulse:  (!) 117 (!) 109  Resp:  18 (!) 32  Temp:  99.8 F (37.7 C)   SpO2:  96% 93%  Weight:  104.3 kg  PainSc: 9      Body mass index is 32.08 kg/m.    CARDIAC STUDIES:  EKG 02/11/2021: Sinus tachycardia 118 bpm Inferior ST elevation, consider acute myocardial injury.  Echocardiogram: Pending   Assessment/Plan    55 year old Caucasian male with remote history of peptic ulcer, h/o post op PE in 2018, admitted with chest pain and ST elevation.    Chest  pain:  Initial concern for inferior STEMI.  However, coronary angiogram showed normal coronaries and no occlusion.   No obvious evidence of aortic dissection on aortogram. Differentials include pericarditis versus pulmonary embolism. Will check echocardiogram, D-dimer, ESR, CRP, COVID test. Pior h/o PE with similar presentation. Will check CTA. Recommend NSAIDs for pain control at this time.  We will add colchicine, if pericarditis confirmed.  CRITICAL CARE Performed by: Vernell Leep   Total critical care time: 35 minutes   Critical care time was exclusive of separately billable procedures and treating other patients.   Critical care was necessary to treat or prevent imminent or life-threatening deterioration.   Critical care was time spent personally by me on the following activities: development of treatment plan with patient and/or surrogate as well as nursing, discussions with consultants, evaluation of patient's response to treatment, examination of patient, obtaining history from patient or surrogate, ordering and performing treatments and interventions, ordering and review of laboratory studies, ordering and review of radiographic studies, pulse oximetry and re-evaluation of patient's condition.      Nigel Mormon, MD Pager: [phone] Office: [phone]

## 2021-02-11 NOTE — ED Notes (Signed)
Pt placed on 2L  for oxygen saturations of 91%. Pt now pale and diaphoretic.

## 2021-02-11 NOTE — Progress Notes (Signed)
TR BAND REMOVAL  LOCATION:    Radial rt radial  DEFLATED PER PROTOCOL:   yes  TIME BAND OFF / DRESSING APPLIED:    1230/gauze and tegaderm  SITE UPON ARRIVAL:    Level 0  SITE AFTER BAND REMOVAL:    Level 0  CIRCULATION SENSATION AND MOVEMENT:    Within Normal Limits : yes, rt hand and fingers warm and pink, palpable rt radial pulse, sensation present  COMMENTS:

## 2021-02-11 NOTE — ED Triage Notes (Signed)
Patient states that he developed flu like symptoms yesterday afternoon and this am awoke with chest pain and 1 episode of emesis. Patient states that the cp worse when he moves and talks.

## 2021-02-12 ENCOUNTER — Encounter (HOSPITAL_COMMUNITY): Payer: Self-pay | Admitting: Cardiology

## 2021-02-12 ENCOUNTER — Inpatient Hospital Stay (HOSPITAL_COMMUNITY): Payer: Managed Care, Other (non HMO)

## 2021-02-12 DIAGNOSIS — D72829 Elevated white blood cell count, unspecified: Secondary | ICD-10-CM

## 2021-02-12 LAB — ECHOCARDIOGRAM COMPLETE
Area-P 1/2: 3.08 cm2
Calc EF: 54.5 %
P 1/2 time: 642 msec
S' Lateral: 3.4 cm
Single Plane A2C EF: 47.1 %
Single Plane A4C EF: 61 %
Weight: 3680 oz

## 2021-02-12 LAB — C-REACTIVE PROTEIN: CRP: 37.2 mg/dL — ABNORMAL HIGH (ref <1.0)

## 2021-02-12 LAB — SEDIMENTATION RATE: Sed Rate: 60 mm/hr — ABNORMAL HIGH (ref 0–16)

## 2021-02-12 MED ORDER — IBUPROFEN 600 MG PO TABS
800.0000 mg | ORAL_TABLET | Freq: Three times a day (TID) | ORAL | Status: DC
  Administered 2021-02-12 – 2021-02-13 (×2): 800 mg via ORAL
  Filled 2021-02-12 (×2): qty 1

## 2021-02-12 MED ORDER — IBUPROFEN 200 MG PO TABS
400.0000 mg | ORAL_TABLET | Freq: Three times a day (TID) | ORAL | Status: DC
  Administered 2021-02-12 (×2): 400 mg via ORAL
  Filled 2021-02-12 (×2): qty 2

## 2021-02-12 MED ORDER — IBUPROFEN 600 MG PO TABS: 800.0000 mg | ORAL_TABLET | Freq: Three times a day (TID) | ORAL | Status: DC

## 2021-02-12 MED FILL — Heparin Sodium (Porcine) Inj 1000 Unit/ML: INTRAMUSCULAR | Qty: 10 | Status: AC

## 2021-02-12 NOTE — Care Management (Signed)
2500 02-12-21 Benefits check submitted for Colchicine. Case Manager will continue to follow for additional needs.

## 2021-02-12 NOTE — TOC Benefit Eligibility Note (Signed)
Transition of Care [facility]) Benefit Eligibility Note    Patient Details  Name: Paul Griffin MRN: [id number] Date of Birth: February 22, 1966   Medication/Dose: colchicine tab. 0.6 bid 30 day supply  Covered?: Yes  Tier:  (tier 1)  Prescription Coverage Preferred Pharmacy: [facility]  Spoke with Person/Company/Phone Number:: Grace L. [employer] [phone]  Co-Pay: $7.00  Prior Approval: No  Deductible:  (?)       Shelda Altes Phone Number: 02/12/2021, 11:21 AM

## 2021-02-12 NOTE — Progress Notes (Signed)
Subjective:  Patient is resting comfortably in bed with 2L/min O2 via [place]. Wife is present at bedside.  Turned off supplemental O2 at bedside and patient maintained 95% O2 sat.  Patient reports significant improvement of chest pain and shortness of breath. He does report dyspnea when moving in bed or sitting up on the edge of the bed.  Denies orthopnea, PND, leg swelling.   Intake/Output from previous day:  No intake/output data recorded. No intake/output data recorded.  Blood pressure 111/88, pulse (!) 103, temperature 98.2 F (36.8 C), temperature source Oral, resp. rate 20, weight 104.3 kg, SpO2 97 %. Physical Exam Vitals reviewed.  Constitutional:      Appearance: He is obese.  HENT:     Head: Normocephalic and atraumatic.  Cardiovascular:     Rate and Rhythm: Regular rhythm. Tachycardia present.     Pulses: Intact distal pulses.     Heart sounds: S1 normal and S2 normal. No murmur heard.   No gallop.  Pulmonary:     Effort: Pulmonary effort is normal. No respiratory distress.     Breath sounds: Examination of the right-lower field reveals decreased breath sounds. Examination of the left-lower field reveals decreased breath sounds. Decreased breath sounds present. No wheezing, rhonchi or rales.  Abdominal:     General: Bowel sounds are normal.  Musculoskeletal:     Right lower leg: No edema.     Left lower leg: No edema.  Neurological:     Mental Status: He is alert.    Lab Results: BMP BNP (last 3 results) No results for input(s): BNP in the last 8760 hours.  ProBNP (last 3 results) No results for input(s): PROBNP in the last 8760 hours. BMP Latest Ref Rng & Units 02/11/2021 02/11/2021 01/18/2014  Glucose 70 - 99 mg/dL - 114(H) 96  BUN 6 - 20 mg/dL - 14 18  Creatinine 0.61 - 1.24 mg/dL 1.00 1.12 1.01  Sodium 135 - 145 mmol/L - 135 143  Potassium 3.5 - 5.1 mmol/L - 4.2 4.0  Chloride 98 - 111 mmol/L - 100 106  CO2 22 - 32 mmol/L - 23 23  Calcium 8.9 - 10.3 mg/dL - 9.1  9.5   Hepatic Function Latest Ref Rng & Units 02/11/2021 01/18/2014 01/02/2013  Total Protein 6.5 - 8.1 g/dL 7.6 7.3 7.2  Albumin 3.5 - 5.0 g/dL 3.8 4.1 3.2(L)  AST 15 - 41 U/L $Remo'26 22 30  'WkvwV$ ALT 0 - 44 U/L 25 19 39  Alk Phosphatase 38 - 126 U/L 108 115 151(H)  Total Bilirubin 0.3 - 1.2 mg/dL 2.0(H) 0.6 0.5  Bilirubin, Direct 0.0 - 0.3 mg/dL - - -   CBC Latest Ref Rng & Units 02/11/2021 02/11/2021 01/18/2014  WBC 4.0 - 10.5 K/uL 16.0(H) 16.0(H) 8.6  Hemoglobin 13.0 - 17.0 g/dL 14.1 14.7 15.0  Hematocrit 39.0 - 52.0 % 43.5 45.0 43.9  Platelets 150 - 400 K/uL 206 240 186   Lipid Panel     Component Value Date/Time   CHOL 114 02/11/2021 0745   TRIG 63 02/11/2021 0745   HDL 40 (L) 02/11/2021 0745   CHOLHDL 2.9 02/11/2021 0745   VLDL 13 02/11/2021 0745   LDLCALC 61 02/11/2021 0745   Cardiac Panel (last 3 results) No results for input(s): CKTOTAL, CKMB, TROPONINI, RELINDX in the last 72 hours.  HEMOGLOBIN A1C Lab Results  Component Value Date   HGBA1C 5.6 02/11/2021   MPG 114 02/11/2021   TSH No results for input(s): TSH in the last 8760  hours.  Imaging: CT Angio Chest Pulmonary Embolism (PE) W or WO Contrast  Result Date: 02/11/2021 CLINICAL DATA:  Chest pain. EXAM: CT ANGIOGRAPHY CHEST WITH CONTRAST TECHNIQUE: Multidetector CT imaging of the chest was performed using the standard protocol during bolus administration of intravenous contrast. Multiplanar CT image reconstructions and MIPs were obtained to evaluate the vascular anatomy. CONTRAST:  46mL OMNIPAQUE IOHEXOL 350 MG/ML SOLN COMPARISON:  August 01, 2013. FINDINGS: Cardiovascular: Satisfactory opacification of the pulmonary arteries to the segmental level. No evidence of pulmonary embolism. Normal heart size. Small pericardial effusion. Mediastinum/Nodes: No enlarged mediastinal, hilar, or axillary lymph nodes. Thyroid gland, trachea, and esophagus demonstrate no significant findings. Lungs/Pleura: No pneumothorax or pleural effusion is  noted. Mild bilateral posterior basilar subsegmental atelectasis is noted. Upper Abdomen: No acute abnormality. Musculoskeletal: No chest wall abnormality. No acute or significant osseous findings. Review of the MIP images confirms the above findings. IMPRESSION: No definite evidence of pulmonary embolus. Small pericardial effusion is noted. Mild bilateral posterior basilar subsegmental atelectasis is noted. Electronically Signed   By: Marijo Conception M.D.   On: 02/11/2021 13:15   CARDIAC CATHETERIZATION  Result Date: 02/11/2021 Normal coronaries Normal LVEDP and LVEDP No thoracic aorta dissection Nigel Mormon, MD Pager: [phone] Office: [phone]   PERIPHERAL VASCULAR CATHETERIZATION  Result Date: 02/11/2021 Normal coronaries Normal LVEDP and LVEDP No thoracic aorta dissection Nigel Mormon, MD Pager: [phone] Office: [phone]   DG Chest Port 1 View  Result Date: 02/11/2021 CLINICAL DATA:  Shortness of breath and mid chest pain. EXAM: PORTABLE CHEST 1 VIEW COMPARISON:  Chest CT August 01, 2013 FINDINGS: Cardiomediastinal silhouette is normal. Mediastinal contours appear intact. Low lung volumes. Possible mild interstitial pulmonary edema. Left lower lobe atelectasis. Osseous structures are without acute abnormality. Soft tissues are grossly normal. IMPRESSION: Low lung volumes with possible mild interstitial pulmonary edema. Electronically Signed   By: Fidela Salisbury M.D.   On: 02/11/2021 10:23    Cardiac Studies: Echocardiogram: Pending  EKG:  02/11/2021: Sinus tachycardia 118 bpm Inferior ST elevation, consider acute myocardial injury.  No results found for this or any previous visit (from the past 43800 hour(s)).  Scheduled Meds:  atorvastatin  10 mg Oral Daily   colchicine  0.6 mg Oral BID   heparin  5,000 Units Subcutaneous Q8H   ibuprofen  400 mg Oral Q8H   pantoprazole  40 mg Oral Daily   sertraline  100 mg Oral QHS   sodium chloride flush  3 mL  Intravenous Q12H   Continuous Infusions:  sodium chloride     sodium chloride     PRN Meds:.sodium chloride, acetaminophen, ondansetron (ZOFRAN) IV, sodium chloride flush  Assessment/Plan:   55 year old Caucasian male with remote history of peptic ulcer, h/o post op PE in 2018, admitted with chest pain and ST elevation.    Chest pain:  Initial concern for inferior STEMI given patient's presentation and EKG with inferior ST elevation.  Patient was emergently taken to the Cath Lab, however coronary angiography revealed normal coronary arteries without evidence of occlusion. Positive D-dimer, CTA negative for PE   Further evaluation of patient's EKG revealed diffuse ST elevation, suspicious for pericarditis.  Echocardiogram, CRP, ESR pending. Given high suspicion of pericarditis will treat with colchicine and ibuprofen. Continue colchicine 0.6 mg p.o. BID Start ibuprofen 400 mg p.o. TID. Will avoid higher dose of ibuprofen given patient's history of peptic ulcers.  Continue pantoprazole   Notably patient is currently requiring intermittent supplemental oxygen 2 L/min via  nasal cannula.  At baseline he does not require supplemental oxygen.  This may be related to underlying atelectasis noted on CT scan.   Patient's temperature is also elevated with maximum overnight of 100.3 degrees Fahrenheit. Will place order for incentive spirometry  Pericardial effusion:  CTA noted small pericardial effusion.  We will obtain echocardiogram to further evaluate.   Leukocytosis:  Patient's WBC 16.0. His temperature is also elevated and patient is mildly tachycardic. Chest CT revealed mild basilar atelectasis without evidence of infiltrate. Patient may have viral infection contributing to presentation and precipitating factor for pericarditis.    Patient was seen in collaboration with Dr. Virgina Jock. He also reviewed patient's chart and examined the patient. Dr. Virgina Jock is in agreement of the plan.      Alethia Berthold, PA-C 02/12/2021, 8:32 AM Office: [phone]

## 2021-02-12 NOTE — Progress Notes (Signed)
Echocardiogram 02/12/2021: 1. Left ventricular ejection fraction, by estimation, is 55 to 60%. The  left ventricle has normal function. The left ventricle has no regional  wall motion abnormalities. Left ventricular diastolic parameters are  indeterminate.   2. Right ventricular systolic function is normal. The right ventricular  size is normal. There is mildly elevated pulmonary artery systolic  pressure.   3. Moderate pericardial effusion. The pericardial effusion is  circumferential. There is no evidence of cardiac tamponade.   4. The mitral valve is grossly normal. Mild mitral valve regurgitation.   5. The aortic valve is tricuspid. Aortic valve regurgitation is not  visualized.   6. The inferior vena cava is normal in size with <50% respiratory  variability, suggesting right atrial pressure of 8 mmHg.   Reviewed findings with the patient and wife. Assessed the patient at bedside. He looks much more comfortable, not tachypnic. HR around 100 bpm, which is lower than before. BP 117/77 mmHg. No pulsus paradoxus.  Clinically, he does not have tamponade and does not need pericardiocentesis. Continue to monitor closely.  EST 60, CRP 37.  Increased ibuprofen to 800 mg tid. Continue colchicine 0.6 mg bid.  Will check screening autoimmune labs with ANA, RF, HLA-B27. Limited echo tomorrow to re-assess pericardial effusion.  CRITICAL CARE Performed by: Vernell Leep   Total critical care time: 35 minutes   Critical care time was exclusive of separately billable procedures and treating other patients.   Critical care was necessary to treat or prevent imminent or life-threatening deterioration.   Critical care was time spent personally by me on the following activities: development of treatment plan with patient and/or surrogate as well as nursing, discussions with consultants, evaluation of patient's response to treatment, examination of patient, obtaining history from patient or  surrogate, ordering and performing treatments and interventions, ordering and review of laboratory studies, ordering and review of radiographic studies, pulse oximetry and re-evaluation of patient's condition.

## 2021-02-12 NOTE — Progress Notes (Signed)
  Echocardiogram 2D Echocardiogram has been performed.  Paul Griffin 02/12/2021, 4:06 PM

## 2021-02-13 ENCOUNTER — Other Ambulatory Visit (HOSPITAL_COMMUNITY): Payer: Self-pay

## 2021-02-13 ENCOUNTER — Encounter: Payer: Self-pay | Admitting: Student

## 2021-02-13 ENCOUNTER — Inpatient Hospital Stay (HOSPITAL_COMMUNITY): Payer: Managed Care, Other (non HMO)

## 2021-02-13 ENCOUNTER — Telehealth: Payer: Self-pay

## 2021-02-13 DIAGNOSIS — I3139 Other pericardial effusion (noninflammatory): Secondary | ICD-10-CM

## 2021-02-13 LAB — ECHOCARDIOGRAM LIMITED
Height: 71 in
Weight: 3647.29 oz

## 2021-02-13 LAB — RHEUMATOID FACTOR: Rheumatoid fact SerPl-aCnc: 30.6 IU/mL — ABNORMAL HIGH (ref <14.0)

## 2021-02-13 LAB — ANA: Anti Nuclear Antibody (ANA): NEGATIVE

## 2021-02-13 MED ORDER — IBUPROFEN 800 MG PO TABS
800.0000 mg | ORAL_TABLET | Freq: Three times a day (TID) | ORAL | 0 refills | Status: DC
  Filled 2021-02-13: qty 30, 10d supply, fill #0

## 2021-02-13 MED ORDER — COLCHICINE 0.6 MG PO TABS
0.6000 mg | ORAL_TABLET | Freq: Two times a day (BID) | ORAL | 1 refills | Status: DC
  Filled 2021-02-13: qty 30, 15d supply, fill #0

## 2021-02-13 MED ORDER — PANTOPRAZOLE SODIUM 40 MG PO TBEC
40.0000 mg | DELAYED_RELEASE_TABLET | Freq: Every day | ORAL | 11 refills | Status: DC
  Filled 2021-02-13: qty 30, 30d supply, fill #0

## 2021-02-13 NOTE — Progress Notes (Signed)
Pt is alert and oriented. Discharge instructions/ AVS given to pt. 

## 2021-02-13 NOTE — Progress Notes (Signed)
  Echocardiogram 2D Echocardiogram has been performed.  Paul Griffin 02/13/2021, 11:07 AM

## 2021-02-13 NOTE — Discharge Summary (Signed)
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 No evidence of pulmonary embolism. Normal heart size. Small pericardial effusion.  Mediastinum/Nodes: No enlarged mediastinal, hilar, or axillary lymph nodes.  Thyroid gland, trachea, and esophagus demonstrate no significant findings. Lungs/Pleura: No pneumothorax or pleural effusion is noted. Mild bilateral posterior basilar subsegmental atelectasis is noted.  Upper Abdomen: No acute abnormality.  Musculoskeletal: No chest wall abnormality. No acute or significant osseous findings.  IMPRESSION: No definite evidence of pulmonary embolus. Small pericardial effusion is noted. Mild bilateral posterior basilar subsegmental atelectasis is noted. Electronically Signed   By: Marijo Conception M.D.   On: 02/11/2021 13:15   CARDIAC CATHETERIZATION 02/11/2021 Normal coronaries Normal LVEDP and LVEDP No thoracic aorta dissection   DG Chest [facility] 02/11/2021 Cardiomediastinal silhouette is normal. Mediastinal contours appear intact. Low lung volumes. Possible mild interstitial pulmonary edema. Left lower lobe atelectasis. Osseous structures are without acute abnormality. Soft tissues are grossly normal. IMPRESSION: Low lung volumes with possible mild interstitial pulmonary edema.   ECHOCARDIOGRAM 02/12/2021    1. Left ventricular ejection fraction, by estimation, is 55 to 60%. The  left ventricle has normal function. The left ventricle has no regional  wall motion abnormalities. Left ventricular diastolic parameters are  indeterminate.    2. Right ventricular systolic function is normal. The right ventricular  size is normal. There is mildly elevated pulmonary artery systolic  pressure.   3. Moderate pericardial effusion. The pericardial effusion is  circumferential. There is no evidence of cardiac tamponade.   4. The mitral valve is grossly normal. Mild mitral valve regurgitation.   5. The aortic valve is tricuspid. Aortic valve regurgitation is not  visualized.   6. The inferior vena cava is normal in size with <50% respiratory  variability, suggesting right atrial pressure of 8 mmHg.  Limited Echocardiogram 02/13/2021:  1. Left ventricular ejection fraction, by estimation, is 55 to 60%. The  left ventricle has normal function. The left ventricle has no regional  wall motion abnormalities.   2. Pericardial effusionis smaller in size, compared to previous study on  02/12/2021. a small pericardial effusion is present. The pericardial  effusion is circumferential. Findings are consistent with cardiac  tamponade.   3. The inferior vena cava is normal in size with greater than 50%  respiratory variability, suggesting right atrial pressure of 3 mmHg.    Hospital Course: Paul Griffin is a 55 y.o. male patient with history of hyperlipidemia, remote history of PE, remote history of peptic ulcer who presented with acute chest pain and ST elevation.  Patient subsequently underwent coronary angiography which revealed normal coronary arteries and thoracic aorta.  D-dimer was mildly elevated, however CTA ruled out PE and rather showed lung atelectasis.  CTA did reveal small pericardial effusion, follow-up echo showed pericardial effusion to be moderate in size without evidence of cardiac tamponade.  Overall presentation consistent with acute pericarditis, patient was therefore started on colchicine and ibuprofen. Patient symptoms significantly improved and repeat echocardiogram revealed improvement of pericardial effusion.    Recommendations on discharge: Acute pericarditis:  Continue ibuprofen 800 mg p.o. 3 times daily Continue colchicine 0.6 mg p.o. twice daily  Continue pantoprazole  Autoimmune labs pending, will follow-up outpatient    Moderate pericardial effusion:  Repeat echocardiogram revealed improvement of pericardial effusion.  Patient is stable without evidence of tamponade.  We will plan to repeat echocardiogram as an outpatient basis to continue to monitor.  Leukocytosis:   Recommend patient follow up with PCP for further evaluation   Recommend patient also follow up with PCP for further evaluation of possible underlying autoimmune disorders.    Discharge Exam: Vitals with BMI 02/13/2021 02/13/2021 02/13/2021  Height - - -  Weight - - -  BMI - - -  Systolic 938 182 993  Diastolic 63 78 78  Pulse 92 87 88     Physical Exam Vitals reviewed.  Constitutional:      Appearance: He is obese.  HENT:     Head: Normocephalic and atraumatic.  Neck:     Vascular: No carotid bruit.  Cardiovascular:     Rate and Rhythm: Normal rate and regular rhythm.     Pulses: Intact distal pulses.     Heart sounds: S1 normal and S2 normal. No murmur heard.   No friction rub. No gallop.  Pulmonary:     Effort: Pulmonary effort is normal. No respiratory distress.     Breath sounds: No wheezing, rhonchi or rales.  Abdominal:     General: Bowel sounds are normal. There is no distension.  Musculoskeletal:     Right lower leg: No edema.     Left lower leg: No edema.  Neurological:     Mental Status: He is alert.    Labs:   Lab Results  Component Value Date   WBC 16.0 (H) 02/11/2021   HGB 14.1 02/11/2021   HCT 43.5 02/11/2021   MCV 88.8 02/11/2021   PLT 206 02/11/2021    Recent Labs  Lab 02/11/21 0745 02/11/21 0947  NA 135  --   K 4.2  --   CL 100  --   CO2 23  --   BUN 14  --   CREATININE 1.12 1.00  CALCIUM 9.1  --   PROT 7.6  --   BILITOT 2.0*  --   ALKPHOS 108  --   ALT 25  --    AST 26  --   GLUCOSE 114*  --     Lipid Panel     Component Value Date/Time   CHOL 114 02/11/2021 0745   TRIG 63 02/11/2021 0745   HDL 40 (L) 02/11/2021 0745   CHOLHDL 2.9 02/11/2021 0745   VLDL 13 02/11/2021 0745   LDLCALC 61 02/11/2021 0745    BNP (last 3 results) No results for input(s): BNP in the last 8760 hours.  HEMOGLOBIN A1C Lab Results  Component Value Date   HGBA1C 5.6 02/11/2021   MPG 114 02/11/2021   Sed rate 02/12/2021: 60 CRP 02/13/2020: 37.2 RA 02/12/2021: 30.6 (high)  ANA, HLA-B27 : pending   Cardiac Panel  High-sensitivity troponin 02/11/2021: 6 --> 6  TSH No results for input(s): TSH in the last 8760 hours.  FOLLOW UP PLANS AND APPOINTMENTS  Allergies as of 02/13/2021       Reactions   Buprenorphine Hcl Other (See Comments)   hallucinations   Morphine And Related Other (See Comments)   hallucinations        Medication List     STOP taking these medications    celecoxib 200 MG capsule Commonly known as: CELEBREX   omeprazole 40 MG capsule Commonly known as: PRILOSEC Replaced  by: pantoprazole 40 MG tablet       TAKE these medications    clobetasol cream 0.05 % Commonly known as: TEMOVATE Apply 1 application topically as needed.   colchicine 0.6 MG tablet Take 1 tablet (0.6 mg total) by mouth 2 (two) times daily.   diclofenac sodium 1 % Gel Commonly known as: VOLTAREN Apply 2 g topically as needed (pain).   gabapentin 300 MG capsule Commonly known as: NEURONTIN Take 300 mg by mouth at bedtime as needed (pain).   gabapentin 100 MG capsule Commonly known as: NEURONTIN Take 100 mg by mouth 3 (three) times daily as needed for pain.   HYDROcodone-acetaminophen 5-325 MG tablet Commonly known as: NORCO/VICODIN Take 1 tablet by mouth 2 (two) times daily as needed for pain.   ibuprofen 800 MG tablet Commonly known as: ADVIL Take 1 tablet (800 mg total) by mouth every 8 (eight) hours. What changed: when to take this    LORazepam 0.5 MG tablet Commonly known as: ATIVAN Take 0.5 mg by mouth as needed for anxiety.   meclizine 25 MG tablet Commonly known as: ANTIVERT Take 1 tablet (25 mg total) by mouth 3 (three) times daily as needed for dizziness.   methocarbamol 750 MG tablet Commonly known as: ROBAXIN Take 750 mg by mouth as needed for muscle spasms.   multivitamin with minerals Tabs tablet Take 1 tablet by mouth daily.   pantoprazole 40 MG tablet Commonly known as: PROTONIX Take 1 tablet (40 mg total) by mouth daily. Replaces: omeprazole 40 MG capsule   rosuvastatin 20 MG tablet Commonly known as: CRESTOR Take 20 mg by mouth daily.   sertraline 100 MG tablet Commonly known as: ZOLOFT Take 100 mg by mouth Daily.       ASK your doctor about these medications    DSS 100 MG Caps Take 200 mg by mouth 2 (two) times daily.   mupirocin ointment 2 % Commonly known as: BACTROBAN Apply 1 application topically 2 (two) times daily.        Follow-up Information     Nigel Mormon, MD. Go on 02/26/2021.   Specialties: Cardiology, Radiology Why: Appointment at 11:30am. Please arrive 15 minutes early and bring all medications with you. Contact information: [place] [address] [phone]               Patient was seen in collaboration with Dr. Virgina Jock. He also reviewed patient's chart and examined the patient. Dr. Virgina Jock is in agreement of the plan.   Total time spent: 40 minutes    Alethia Berthold, PA-C 02/13/2021, 8:39 AM Office: [phone]

## 2021-02-13 NOTE — Telephone Encounter (Signed)
Location of hospitalization: [place] Reason for hospitalization: Sever chest pressure and dyspnea Date of discharge: 02/13/2021 Date of first communication with patient: today Person contacting patient: Me Current symptoms: Nauseous and jittery, vitals are good Do you understand why you were in the Hospital: Yes Questions regarding discharge instructions: None Where were you discharged to: Home Medications reviewed: Yes Allergies reviewed: Yes Dietary changes reviewed: Yes. Discussed low fat and low salt diet.  Referals reviewed: NA Activities of Daily Living: Able to with mild limitations Any transportation issues/concerns: None Any patient concerns: None Confirmed importance & date/time of Follow up appt: Yes Confirmed with patient if condition begins to worsen call. Pt was given the office number and encouraged to call back with questions or concerns: Yes

## 2021-02-16 MED FILL — Nitroglycerin IV Soln 100 MCG/ML in D5W: INTRA_ARTERIAL | Qty: 10 | Status: AC

## 2021-02-16 NOTE — Telephone Encounter (Signed)
Called patient, Paul Griffin, LMAM

## 2021-02-16 NOTE — Telephone Encounter (Signed)
TOC done.

## 2021-02-19 LAB — HLA-B27 ANTIGEN: HLA-B27: NEGATIVE

## 2021-02-19 NOTE — Progress Notes (Signed)
Please forward this to PCP

## 2021-02-26 ENCOUNTER — Encounter: Payer: Self-pay | Admitting: Student

## 2021-02-26 ENCOUNTER — Other Ambulatory Visit: Payer: Self-pay

## 2021-02-26 ENCOUNTER — Ambulatory Visit: Payer: Managed Care, Other (non HMO) | Admitting: Student

## 2021-02-26 VITALS — BP 107/79 | HR 90 | Temp 98.1°F | Ht 71.0 in | Wt 225.6 lb

## 2021-02-26 DIAGNOSIS — I313 Pericardial effusion (noninflammatory): Secondary | ICD-10-CM

## 2021-02-26 DIAGNOSIS — I3139 Other pericardial effusion (noninflammatory): Secondary | ICD-10-CM

## 2021-02-26 DIAGNOSIS — I300 Acute nonspecific idiopathic pericarditis: Secondary | ICD-10-CM

## 2021-02-26 MED ORDER — IBUPROFEN 800 MG PO TABS: 800.0000 mg | ORAL_TABLET | Freq: Three times a day (TID) | ORAL | 1 refills | Status: DC

## 2021-02-26 MED ORDER — COLCHICINE 0.6 MG PO TABS: 0.6000 mg | ORAL_TABLET | Freq: Two times a day (BID) | ORAL | 1 refills | Status: DC

## 2021-02-26 NOTE — Progress Notes (Signed)
Looks better than during his hospitalization.  Thanks MJP

## 2021-02-26 NOTE — Telephone Encounter (Signed)
Error

## 2021-02-26 NOTE — Progress Notes (Signed)
Primary Physician/Referring:  Leanna Battles, MD  [name], male    DOB: 11-27-1965, 55 y.o.   MRN: [id number]  Chief Complaint  Patient presents with   pericarditis   Hospitalization Follow-up   HPI:    Paul Griffin  is a 55 y.o. Caucasian male with h/o PE, remote history of peptic ulcer, admitted with acute pericarditis.  ESR and CRP were elevated.  Echocardiogram showed moderate pericardial effusion, which subsequently reduced in size, without signs of tamponade.  Patient's pain improved with ibuprofen and colchicine.  Paul Griffin was discharged home with ibuprofen 800 mg 3 times daily, colchicine 0.6 mg twice daily, and pantoprazole.  Patient was discharged from the hospital 02/13/2021.  Paul Griffin has since followed up with his PCP and experienced nausea potentially due to ibuprofen, however no changes were made. Patient has continued to take colchicine and ibuprofen as directed at discharge and nausea has resolved. PCP is also following with patient as rheumatoid factor was elevated during hospitalization.  PCP repeated sed rate and CRP which remained elevated on 02/19/2021.  Patient states overall since discharge she is feeling much improved, however Paul Griffin does continue to have chest pain which is worse when Paul Griffin lays on his left side.  Otherwise denies dyspnea, dizziness, syncope, near syncope, orthopnea.  Discharge Summary 02/13/2021:  Hospital Course: Paul Griffin is a 55 y.o. male patient with history of hyperlipidemia, remote history of PE, remote history of peptic ulcer who presented with acute chest pain and ST elevation.  Patient subsequently underwent coronary angiography which revealed normal coronary arteries and thoracic aorta.  D-dimer was mildly elevated, however CTA ruled out PE and rather showed lung atelectasis.  CTA did reveal small pericardial effusion, follow-up echo showed pericardial effusion to be moderate in size without evidence of cardiac  tamponade.   Overall presentation consistent with acute pericarditis, patient was therefore started on colchicine and ibuprofen. Patient symptoms significantly improved and repeat echocardiogram revealed improvement of pericardial effusion.    Recommendations on discharge: Acute pericarditis:  Continue ibuprofen 800 mg p.o. 3 times daily Continue colchicine 0.6 mg p.o. twice daily Continue pantoprazole  Autoimmune labs pending, will follow-up outpatient     Moderate pericardial effusion:  Repeat echocardiogram revealed improvement of pericardial effusion.  Patient is stable without evidence of tamponade.  We will plan to repeat echocardiogram as an outpatient basis to continue to monitor.   Leukocytosis:   Recommend patient follow up with PCP for further evaluation  Past Medical History:  Diagnosis Date   Abdominal distension    Anemia    Anxiety    panic attacks   Blood in stool    Chronic back pain    "neck to sacral" (01/01/2013)   Clotting disorder ([place])    PE right    Constipation    DDD (degenerative disc disease), cervical    DDD (degenerative disc disease), lumbar    DDD (degenerative disc disease), lumbosacral    DDD (degenerative disc disease), thoracolumbar    Depression    Diarrhea    Difficulty urinating    Dyslipidemia    "good cholesterol isn't as high as dr would like" (01/01/2013)   Erectile dysfunction    organic   Family history of anesthesia complication    "Mom; PONV" (01/01/2013)   Family history of colon cancer    Generalized headaches    "monthly" (01/01/2013)   GERD (gastroesophageal reflux disease)    History of blood transfusion 1980's   "related to  bleeding ulcers" (01/01/2013)   Hoarseness of voice    chronic   Hyperlipidemia    Lipoma of arm 1994   right   Mitral valve prolapse    "mild" (01/01/2013)   Nasal congestion    Obstructive sleep apnea    miald w aith RDI of 6.4 per hour   Osteoarthritis    "hips & hands" (01/01/2013)    Palpitations    Peptic ulcer 1994   severe requiring vagotomy and pyloroplasty   PONV (postoperative nausea and vomiting)    Pulmonary embolism on right ([place]) 01/01/2013   Rectal bleeding    Shortness of breath    "related to the PE today" (01/01/2013)   Trouble swallowing    Tubular adenoma of colon 08/2012   Ulcer    gastric 1988   Past Surgical History:  Procedure Laterality Date   COLONOSCOPY  07/2007   showed polyps, follow up in 2013   EXCISIONAL HEMORRHOIDECTOMY     "w/colonoscopies" (01/01/2013)   HERNIA REPAIR  12/25/12   multi inc hernia   INCISIONAL HERNIA REPAIR N/A 12/25/2012   Procedure: LAPAROSCOPIC REPAIR MULITPLE INCISIONAL HERNIA WITH MESH;  Surgeon: Adin Hector, MD;  Location: [place];  Service: General;  Laterality: N/A;   INSERTION OF MESH N/A 12/25/2012   Procedure: INSERTION OF MESH;  Surgeon: Adin Hector, MD;  Location: [place];  Service: General;  Laterality: N/A;   LEFT HEART CATH AND CORONARY ANGIOGRAPHY N/A 02/11/2021   Procedure: LEFT HEART CATH AND CORONARY ANGIOGRAPHY;  Surgeon: Nigel Mormon, MD;  Location: [place] [facility];  Service: Cardiovascular;  Laterality: N/A;   LEFT HEART CATHETERIZATION WITH CORONARY ANGIOGRAM N/A 10/15/2013   Procedure: LEFT HEART CATHETERIZATION WITH CORONARY ANGIOGRAM;  Surgeon: Laverda Page, MD;  Location: [facility] CATH [facility];  Service: Cardiovascular;  Laterality: N/A;   POSTERIOR CERVICAL FUSION/FORAMINOTOMY  2012   fusion   THORACIC AORTOGRAM N/A 02/11/2021   Procedure: THORACIC AORTOGRAM;  Surgeon: Nigel Mormon, MD;  Location: [place] [facility];  Service: Cardiovascular;  Laterality: N/A;   TONSILLECTOMY  2008   VAGOTOMY AND PYLOROPLASTY  1988   "partial; for ulcers" (01/01/2013)   Family History  Problem Relation Age of Onset   Colon cancer Mother    Lymphoma Mother        non- hodgkins   COPD Mother    Melanoma Mother    Colon polyps Mother    Ovarian cancer Sister    Ulcerative colitis Sister     Colon polyps Sister    Kidney cancer Father    Heart disease Father    Colon polyps Father    Breast cancer Sister    Colon polyps Sister    Cancer Sister        brain   Colon polyps Sister    Colon polyps Brother    Colon polyps Sister    Colon polyps Sister    Colon polyps Brother    Rectal cancer Neg Hx    Stomach cancer Neg Hx     Social History   Tobacco Use   Smoking status: Never   Smokeless tobacco: Never  Substance Use Topics   Alcohol use: Yes    Comment: 01/01/2013 "maybe 1-2 beers/month"   Marital Status: Married   ROS  Review of Systems  Cardiovascular:  Positive for chest pain (when lying on left side). Negative for claudication, leg swelling, near-syncope, orthopnea, palpitations, paroxysmal nocturnal dyspnea and syncope.  Respiratory:  Negative  for shortness of breath.   Neurological:  Negative for dizziness.   Objective  Blood pressure 107/79, pulse 90, temperature 98.1 F (36.7 C), height _0  (1.803 m), weight 225 lb 9.6 oz (102.3 kg), SpO2 98 %.  Vitals with BMI 02/26/2021 02/13/2021 02/13/2021  Height _1  - -  Weight 225 lbs 10 oz - -  BMI 78.58 - -  Systolic 850 277 412  Diastolic 79 63 78  Pulse 90 92 87      Physical Exam Vitals reviewed.  HENT:     Head: Normocephalic and atraumatic.  Cardiovascular:     Rate and Rhythm: Normal rate and regular rhythm.     Pulses: Intact distal pulses.     Heart sounds: S1 normal and S2 normal. No murmur heard.   No gallop.  Pulmonary:     Effort: Pulmonary effort is normal. No respiratory distress.     Breath sounds: No wheezing, rhonchi or rales.  Musculoskeletal:     Right lower leg: No edema.     Left lower leg: No edema.  Neurological:     Mental Status: Paul Griffin is alert.    Laboratory examination:   Recent Labs    02/11/21 0745 02/11/21 0947  NA 135  --   K 4.2  --   CL 100  --   CO2 23  --   GLUCOSE 114*  --   BUN 14  --   CREATININE 1.12 1.00  CALCIUM 9.1  --   GFRNONAA >60  >60   estimated creatinine clearance is 102.8 mL/min (by C-G formula based on SCr of 1 mg/dL).  CMP Latest Ref Rng & Units 02/11/2021 02/11/2021 01/18/2014  Glucose 70 - 99 mg/dL - 114(H) 96  BUN 6 - 20 mg/dL - 14 18  Creatinine 0.61 - 1.24 mg/dL 1.00 1.12 1.01  Sodium 135 - 145 mmol/L - 135 143  Potassium 3.5 - 5.1 mmol/L - 4.2 4.0  Chloride 98 - 111 mmol/L - 100 106  CO2 22 - 32 mmol/L - 23 23  Calcium 8.9 - 10.3 mg/dL - 9.1 9.5  Total Protein 6.5 - 8.1 g/dL - 7.6 7.3  Total Bilirubin 0.3 - 1.2 mg/dL - 2.0(H) 0.6  Alkaline Phos 38 - 126 U/L - 108 115  AST 15 - 41 U/L - 26 22  ALT 0 - 44 U/L - 25 19   CBC Latest Ref Rng & Units 02/11/2021 02/11/2021 01/18/2014  WBC 4.0 - 10.5 K/uL 16.0(H) 16.0(H) 8.6  Hemoglobin 13.0 - 17.0 g/dL 14.1 14.7 15.0  Hematocrit 39.0 - 52.0 % 43.5 45.0 43.9  Platelets 150 - 400 K/uL 206 240 186    Lipid Panel Recent Labs    02/11/21 0745  CHOL 114  TRIG 63  LDLCALC 61  VLDL 13  HDL 40*  CHOLHDL 2.9    HEMOGLOBIN A1C Lab Results  Component Value Date   HGBA1C 5.6 02/11/2021   MPG 114 02/11/2021   TSH No results for input(s): TSH in the last 8760 hours.  External labs:  02/19/2021:  WBC 12.58, Hgb 12.3, HCT 36.1, PLT 398 Na 134, K 4.0, AST 62, ALT 87, Alk Phos 213, Cr 1.0, >60,  Sed rate 72 m/hr  CRP 223 mg/L   Allergies   Allergies  Allergen Reactions   Buprenorphine Hcl Other (See Comments)    hallucinations   Morphine And Related Other (See Comments)    hallucinations    Medications Prior to Visit:   Outpatient  Medications Prior to Visit  Medication Sig Dispense Refill   clobetasol cream (TEMOVATE) 9.02 % Apply 1 application topically as needed.     diclofenac sodium (VOLTAREN) 1 % GEL Apply 2 g topically as needed (pain).     docusate sodium 100 MG CAPS Take 200 mg by mouth 2 (two) times daily. (Patient taking differently: Take 100 mg by mouth as needed (constipation).) 60 capsule 0   gabapentin (NEURONTIN) 100 MG capsule Take  100 mg by mouth 3 (three) times daily as needed for pain.     gabapentin (NEURONTIN) 300 MG capsule Take 300 mg by mouth at bedtime as needed (pain).     HYDROcodone-acetaminophen (NORCO/VICODIN) 5-325 MG tablet Take 1 tablet by mouth 2 (two) times daily as needed for pain.     LORazepam (ATIVAN) 0.5 MG tablet Take 0.5 mg by mouth as needed for anxiety.  3   methocarbamol (ROBAXIN) 750 MG tablet Take 750 mg by mouth as needed for muscle spasms.     Multiple Vitamin (MULTIVITAMIN WITH MINERALS) TABS Take 1 tablet by mouth daily.     mupirocin ointment (BACTROBAN) 2 % Apply 1 application topically 2 (two) times daily. 30 g 0   omeprazole (PRILOSEC) 40 MG capsule Take 40 mg by mouth at bedtime.     pantoprazole (PROTONIX) 40 MG tablet Take 1 tablet (40 mg total) by mouth daily. 30 tablet 11   rosuvastatin (CRESTOR) 20 MG tablet Take 20 mg by mouth daily.     sertraline (ZOLOFT) 100 MG tablet Take 100 mg by mouth Daily.      colchicine 0.6 MG tablet Take 1 tablet (0.6 mg total) by mouth 2 (two) times daily. 30 tablet 1   ibuprofen (ADVIL) 800 MG tablet Take 1 tablet (800 mg total) by mouth every 8 (eight) hours. 30 tablet 0   amoxicillin-clavulanate (AUGMENTIN) 875-125 MG tablet amoxicillin 875 mg-potassium clavulanate 125 mg tablet  TAKE 1 TABLET BY MOUTH TWICE DAILY FOR 10 DAYS (Patient not taking: Reported on 02/26/2021)     meclizine (ANTIVERT) 25 MG tablet Take 1 tablet (25 mg total) by mouth 3 (three) times daily as needed for dizziness. 30 tablet 0   No facility-administered medications prior to visit.     Final Medications at End of Visit    Current Meds  Medication Sig   clobetasol cream (TEMOVATE) 4.09 % Apply 1 application topically as needed.   diclofenac sodium (VOLTAREN) 1 % GEL Apply 2 g topically as needed (pain).   docusate sodium 100 MG CAPS Take 200 mg by mouth 2 (two) times daily. (Patient taking differently: Take 100 mg by mouth as needed (constipation).)   gabapentin  (NEURONTIN) 100 MG capsule Take 100 mg by mouth 3 (three) times daily as needed for pain.   gabapentin (NEURONTIN) 300 MG capsule Take 300 mg by mouth at bedtime as needed (pain).   HYDROcodone-acetaminophen (NORCO/VICODIN) 5-325 MG tablet Take 1 tablet by mouth 2 (two) times daily as needed for pain.   LORazepam (ATIVAN) 0.5 MG tablet Take 0.5 mg by mouth as needed for anxiety.   methocarbamol (ROBAXIN) 750 MG tablet Take 750 mg by mouth as needed for muscle spasms.   Multiple Vitamin (MULTIVITAMIN WITH MINERALS) TABS Take 1 tablet by mouth daily.   mupirocin ointment (BACTROBAN) 2 % Apply 1 application topically 2 (two) times daily.   omeprazole (PRILOSEC) 40 MG capsule Take 40 mg by mouth at bedtime.   pantoprazole (PROTONIX) 40 MG tablet Take 1 tablet (40  mg total) by mouth daily.   rosuvastatin (CRESTOR) 20 MG tablet Take 20 mg by mouth daily.   sertraline (ZOLOFT) 100 MG tablet Take 100 mg by mouth Daily.    [DISCONTINUED] colchicine 0.6 MG tablet Take 1 tablet (0.6 mg total) by mouth 2 (two) times daily.   [DISCONTINUED] ibuprofen (ADVIL) 800 MG tablet Take 1 tablet (800 mg total) by mouth every 8 (eight) hours.   Radiology:   No results found.  Cardiac Studies:   CT Angio Chest Pulmonary Embolism (PE) W or WO Contrast 02/11/2021 Cardiovascular: Satisfactory opacification of the pulmonary arteries to the segmental level. No evidence of pulmonary embolism. Normal heart size. Small pericardial effusion. Mediastinum/Nodes: No enlarged mediastinal, hilar, or axillary lymph nodes. Thyroid gland, trachea, and esophagus demonstrate no significant findings. Lungs/Pleura: No pneumothorax or pleural effusion is noted. Mild bilateral posterior basilar subsegmental atelectasis is noted. Upper Abdomen: No acute abnormality. Musculoskeletal: No chest wall abnormality. No acute or significant osseous findings. IMPRESSION: No definite evidence of pulmonary embolus. Small pericardial effusion is noted.  Mild bilateral posterior basilar subsegmental atelectasis is noted. Electronically Signed   By: Marijo Conception M.D.   On: 02/11/2021 13:15   CARDIAC CATHETERIZATION 02/11/2021 Normal coronaries Normal LVEDP and LVEDP No thoracic aorta dissection   DG Chest [facility] 02/11/2021 Cardiomediastinal silhouette is normal. Mediastinal contours appear intact. Low lung volumes. Possible mild interstitial pulmonary edema. Left lower lobe atelectasis. Osseous structures are without acute abnormality. Soft tissues are grossly normal. IMPRESSION: Low lung volumes with possible mild interstitial pulmonary edema.   ECHOCARDIOGRAM 02/12/2021    1. Left ventricular ejection fraction, by estimation, is 55 to 60%. The  left ventricle has normal function. The left ventricle has no regional  wall motion abnormalities. Left ventricular diastolic parameters are  indeterminate.   2. Right ventricular systolic function is normal. The right ventricular  size is normal. There is mildly elevated pulmonary artery systolic  pressure.   3. Moderate pericardial effusion. The pericardial effusion is  circumferential. There is no evidence of cardiac tamponade.   4. The mitral valve is grossly normal. Mild mitral valve regurgitation.   5. The aortic valve is tricuspid. Aortic valve regurgitation is not  visualized.   6. The inferior vena cava is normal in size with <50% respiratory  variability, suggesting right atrial pressure of 8 mmHg.   Limited Echocardiogram 02/13/2021:  1. Left ventricular ejection fraction, by estimation, is 55 to 60%. The  left ventricle has normal function. The left ventricle has no regional  wall motion abnormalities.   2. Pericardial effusionis smaller in size, compared to previous study on  02/12/2021. a small pericardial effusion is present. The pericardial  effusion is circumferential. Findings are consistent with cardiac  tamponade.   3. The inferior vena cava is normal in size with greater than 50%   respiratory variability, suggesting right atrial pressure of 3 mmHg.  EKG:   02/26/2021: Sinus rhythm at a rate of 91 bpm.  Normal axis.  Incomplete right bundle branch block.  Nonspecific T wave abnormality.  Compared to EKG 02/11/2021, ST elevation improved  02/11/2021: Sinus tachycardia 118 bpm Inferior ST elevation, consider acute myocardial injury.  Assessment     ICD-10-CM   1. Acute idiopathic pericarditis  I30.0 EKG 12-Lead    2. Pericardial effusion  I31.3 PCV ECHOCARDIOGRAM LIMITED       Medications Discontinued During This Encounter  Medication Reason   amoxicillin-clavulanate (AUGMENTIN) 875-125 MG tablet Error   meclizine (ANTIVERT) 25 MG  tablet Error   ibuprofen (ADVIL) 800 MG tablet Reorder   colchicine 0.6 MG tablet Reorder    Meds ordered this encounter  Medications   colchicine 0.6 MG tablet    Sig: Take 1 tablet (0.6 mg total) by mouth 2 (two) times daily.    Dispense:  30 tablet    Refill:  1   ibuprofen (ADVIL) 800 MG tablet    Sig: Take 1 tablet (800 mg total) by mouth every 8 (eight) hours.    Dispense:  90 tablet    Refill:  1    Recommendations:   Paul Griffin is a 55 y.o. Caucasian male with h/o PE, remote history of peptic ulcer, admitted with acute pericarditis 02/13/2021.   Paul Griffin now presents for follow-up.  Patient does continue to have episodes of chest discomfort, particularly when lying on his left side and CRP and sed rate remain elevated.  Paul Griffin is tolerating colchicine and high-dose ibuprofen without issue.  Paul Griffin previously had nausea which has resolved and likely was not related to ibuprofen.  Patient is scheduled to have repeat CRP and sed rate done in 1 week with PCP.  Recommend continuing to trend.  We will plan to continue colchicine and high-dose ibuprofen until symptoms have completely resolved and CRP returned to normal.  In regard to pericardial effusion, will repeat limited echo to reevaluateAt this time.  Patient's blood pressure  is well controlled and Paul Griffin is not tachycardic at today's visit.  There is no evidence of cardiac tamponade.  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  Advised him to remain out of work for an additional 2 weeks.  Follow-up in 4 weeks, sooner if needed, for pericarditis and pericardial effusion.   Alethia Berthold, PA-C 02/27/2021, 3:29 PM Office: [phone]

## 2021-02-27 ENCOUNTER — Encounter: Payer: Self-pay | Admitting: Student

## 2021-03-05 ENCOUNTER — Other Ambulatory Visit: Payer: Self-pay

## 2021-03-05 ENCOUNTER — Ambulatory Visit: Payer: Managed Care, Other (non HMO)

## 2021-03-05 DIAGNOSIS — I3139 Other pericardial effusion (noninflammatory): Secondary | ICD-10-CM

## 2021-03-13 NOTE — Progress Notes (Signed)
Patient has been scheduled for follow up next week to go over in detail.

## 2021-03-17 NOTE — Progress Notes (Signed)
Primary Physician/Referring:  Paul Battles, MD  [name], male    DOB: 04/05/1966, 55 y.o.   MRN: [id number]  Chief Complaint  Patient presents with   Acute idiopathic pericarditis   Follow-up   Results   HPI:    Paul Griffin  is a 55 y.o. Caucasian male with h/o PE, remote history of peptic ulcer, admitted with acute pericarditis 02/13/2021.   Repeat echocardiogram revealed no evidence of pericardial effusion, LVEF had reduced from 60% during hospitalization to 40-45%.  Patient has been tolerating high-dose ibuprofen, colchicine without issue.  He continues to complain of frequent coughing episodes, worse when he lays on his left side.  Chest pain has resolved.  Otherwise he is relatively asymptomatic.  Denies dyspnea, dizziness, syncope, near syncope, orthopnea, PND, leg edema.  Patient has returned to regular activity around the house without issue, and feels highly motivated to return to work.  Past Medical History:  Diagnosis Date   Abdominal distension    Anemia    Anxiety    panic attacks   Blood in stool    Chronic back pain    "neck to sacral" (01/01/2013)   Clotting disorder ([place])    PE right    Constipation    DDD (degenerative disc disease), cervical    DDD (degenerative disc disease), lumbar    DDD (degenerative disc disease), lumbosacral    DDD (degenerative disc disease), thoracolumbar    Depression    Diarrhea    Difficulty urinating    Dyslipidemia    "good cholesterol isn't as high as dr would like" (01/01/2013)   Erectile dysfunction    organic   Family history of anesthesia complication    "Mom; PONV" (01/01/2013)   Family history of colon cancer    Generalized headaches    "monthly" (01/01/2013)   GERD (gastroesophageal reflux disease)    History of blood transfusion 1980's   "related to bleeding ulcers" (01/01/2013)   Hoarseness of voice    chronic   Hyperlipidemia    Lipoma of arm 1994   right   Mitral valve  prolapse    "mild" (01/01/2013)   Nasal congestion    Obstructive sleep apnea    miald w aith RDI of 6.4 per hour   Osteoarthritis    "hips & hands" (01/01/2013)   Palpitations    Peptic ulcer 1994   severe requiring vagotomy and pyloroplasty   PONV (postoperative nausea and vomiting)    Pulmonary embolism on right ([place]) 01/01/2013   Rectal bleeding    Shortness of breath    "related to the PE today" (01/01/2013)   Trouble swallowing    Tubular adenoma of colon 08/2012   Ulcer    gastric 1988   Past Surgical History:  Procedure Laterality Date   COLONOSCOPY  07/2007   showed polyps, follow up in 2013   EXCISIONAL HEMORRHOIDECTOMY     "w/colonoscopies" (01/01/2013)   HERNIA REPAIR  12/25/12   multi inc hernia   INCISIONAL HERNIA REPAIR N/A 12/25/2012   Procedure: LAPAROSCOPIC REPAIR MULITPLE INCISIONAL HERNIA WITH MESH;  Surgeon: Adin Hector, MD;  Location: [facility];  Service: General;  Laterality: N/A;   INSERTION OF MESH N/A 12/25/2012   Procedure: INSERTION OF MESH;  Surgeon: Adin Hector, MD;  Location: [facility];  Service: General;  Laterality: N/A;   LEFT HEART CATH AND CORONARY ANGIOGRAPHY N/A 02/11/2021   Procedure: LEFT HEART CATH AND CORONARY ANGIOGRAPHY;  Surgeon: Vernell Leep  J, MD;  Location: [facility];  Service: Cardiovascular;  Laterality: N/A;   LEFT HEART CATHETERIZATION WITH CORONARY ANGIOGRAM N/A 10/15/2013   Procedure: LEFT HEART CATHETERIZATION WITH CORONARY ANGIOGRAM;  Surgeon: Pamella Pert, MD;  Location: [facility] CATH LAB;  Service: Cardiovascular;  Laterality: N/A;   POSTERIOR CERVICAL FUSION/FORAMINOTOMY  2012   fusion   THORACIC AORTOGRAM N/A 02/11/2021   Procedure: THORACIC AORTOGRAM;  Surgeon: Elder Negus, MD;  Location: [facility];  Service: Cardiovascular;  Laterality: N/A;   TONSILLECTOMY  2008   VAGOTOMY AND PYLOROPLASTY  1988   "partial; for ulcers" (01/01/2013)   Family History  Problem Relation Age of Onset   Colon  cancer Mother    Lymphoma Mother        non- hodgkins   COPD Mother    Melanoma Mother    Colon polyps Mother    Ovarian cancer Sister    Ulcerative colitis Sister    Colon polyps Sister    Kidney cancer Father    Heart disease Father    Colon polyps Father    Breast cancer Sister    Colon polyps Sister    Cancer Sister        brain   Colon polyps Sister    Colon polyps Brother    Colon polyps Sister    Colon polyps Sister    Colon polyps Brother    Rectal cancer Neg Hx    Stomach cancer Neg Hx     Social History   Tobacco Use   Smoking status: Never   Smokeless tobacco: Never  Substance Use Topics   Alcohol use: Yes    Comment: 01/01/2013 "maybe 1-2 beers/month"   Marital Status: Married   ROS  Review of Systems  Cardiovascular:  Negative for chest pain (resolved), claudication, leg swelling, near-syncope, orthopnea, palpitations, paroxysmal nocturnal dyspnea and syncope.  Respiratory:  Positive for cough. Negative for shortness of breath.   Neurological:  Negative for dizziness.   Objective  Blood pressure 104/78, pulse 87, temperature 97.9 F (36.6 C), temperature source Temporal, height 5\' 11"  (1.803 m), weight 225 lb (102.1 kg), SpO2 98 %.  Vitals with BMI 03/18/2021 02/26/2021 02/13/2021  Height 5\' 11"  5\' 11"  -  Weight 225 lbs 225 lbs 10 oz -  BMI 31.39 31.48 -  Systolic 104 107 04/15/2021  Diastolic 78 79 63  Pulse 87 90 92      Physical Exam Vitals reviewed.  Cardiovascular:     Rate and Rhythm: Normal rate and regular rhythm.     Pulses: Intact distal pulses.     Heart sounds: S1 normal and S2 normal. No murmur heard.   No gallop.  Pulmonary:     Effort: Pulmonary effort is normal. No respiratory distress.     Breath sounds: No wheezing, rhonchi or rales.  Musculoskeletal:     Right lower leg: No edema.     Left lower leg: No edema.  Skin:    General: Skin is warm and dry.  Neurological:     Mental Status: He is alert.    Laboratory examination:    Recent Labs    02/11/21 0745 02/11/21 0947  NA 135  --   K 4.2  --   CL 100  --   CO2 23  --   GLUCOSE 114*  --   BUN 14  --   CREATININE 1.12 1.00  CALCIUM 9.1  --   GFRNONAA >60 >60  CrCl cannot be calculated (Patient's most recent lab result is older than the maximum 21 days allowed.).  CMP Latest Ref Rng & Units 02/11/2021 02/11/2021 01/18/2014  Glucose 70 - 99 mg/dL - 114(H) 96  BUN 6 - 20 mg/dL - 14 18  Creatinine 0.61 - 1.24 mg/dL 1.00 1.12 1.01  Sodium 135 - 145 mmol/L - 135 143  Potassium 3.5 - 5.1 mmol/L - 4.2 4.0  Chloride 98 - 111 mmol/L - 100 106  CO2 22 - 32 mmol/L - 23 23  Calcium 8.9 - 10.3 mg/dL - 9.1 9.5  Total Protein 6.5 - 8.1 g/dL - 7.6 7.3  Total Bilirubin 0.3 - 1.2 mg/dL - 2.0(H) 0.6  Alkaline Phos 38 - 126 U/L - 108 115  AST 15 - 41 U/L - 26 22  ALT 0 - 44 U/L - 25 19   CBC Latest Ref Rng & Units 02/11/2021 02/11/2021 01/18/2014  WBC 4.0 - 10.5 K/uL 16.0(H) 16.0(H) 8.6  Hemoglobin 13.0 - 17.0 g/dL 14.1 14.7 15.0  Hematocrit 39.0 - 52.0 % 43.5 45.0 43.9  Platelets 150 - 400 K/uL 206 240 186    Lipid Panel Recent Labs    02/11/21 0745  CHOL 114  TRIG 63  LDLCALC 61  VLDL 13  HDL 40*  CHOLHDL 2.9    HEMOGLOBIN A1C Lab Results  Component Value Date   HGBA1C 5.6 02/11/2021   MPG 114 02/11/2021   TSH No results for input(s): TSH in the last 8760 hours.  External labs:  02/19/2021:  WBC 12.58, Hgb 12.3, HCT 36.1, PLT 398 Na 134, K 4.0, AST 62, ALT 87, Alk Phos 213, Cr 1.0, >60,  Sed rate 72 m/hr  CRP 223 mg/L   Allergies   Allergies  Allergen Reactions   Buprenorphine Hcl Other (See Comments)    hallucinations   Morphine And Related Other (See Comments)    hallucinations    Medications Prior to Visit:   Outpatient Medications Prior to Visit  Medication Sig Dispense Refill   clobetasol cream (TEMOVATE) 7.67 % Apply 1 application topically as needed.     colchicine 0.6 MG tablet Take 1 tablet (0.6 mg total) by mouth 2 (two) times  daily. 30 tablet 1   diclofenac sodium (VOLTAREN) 1 % GEL Apply 2 g topically as needed (pain).     docusate sodium 100 MG CAPS Take 200 mg by mouth 2 (two) times daily. (Patient taking differently: Take 100 mg by mouth as needed (constipation).) 60 capsule 0   gabapentin (NEURONTIN) 100 MG capsule Take 100 mg by mouth 3 (three) times daily as needed for pain.     gabapentin (NEURONTIN) 300 MG capsule Take 300 mg by mouth at bedtime as needed (pain).     HYDROcodone-acetaminophen (NORCO/VICODIN) 5-325 MG tablet Take 1 tablet by mouth 2 (two) times daily as needed for pain.     ibuprofen (ADVIL) 800 MG tablet Take 1 tablet (800 mg total) by mouth every 8 (eight) hours. 90 tablet 1   LORazepam (ATIVAN) 0.5 MG tablet Take 0.5 mg by mouth as needed for anxiety.  3   methocarbamol (ROBAXIN) 750 MG tablet Take 750 mg by mouth as needed for muscle spasms.     Multiple Vitamin (MULTIVITAMIN WITH MINERALS) TABS Take 1 tablet by mouth daily.     mupirocin ointment (BACTROBAN) 2 % Apply 1 application topically 2 (two) times daily. 30 g 0   omeprazole (PRILOSEC) 40 MG capsule Take 40 mg by mouth at bedtime.  rosuvastatin (CRESTOR) 20 MG tablet Take 20 mg by mouth daily.     sertraline (ZOLOFT) 100 MG tablet Take 100 mg by mouth Daily.      pantoprazole (PROTONIX) 40 MG tablet Take 1 tablet (40 mg total) by mouth daily. 30 tablet 11   No facility-administered medications prior to visit.     Final Medications at End of Visit    Current Meds  Medication Sig   clobetasol cream (TEMOVATE) 1.66 % Apply 1 application topically as needed.   colchicine 0.6 MG tablet Take 1 tablet (0.6 mg total) by mouth 2 (two) times daily.   diclofenac sodium (VOLTAREN) 1 % GEL Apply 2 g topically as needed (pain).   docusate sodium 100 MG CAPS Take 200 mg by mouth 2 (two) times daily. (Patient taking differently: Take 100 mg by mouth as needed (constipation).)   gabapentin (NEURONTIN) 100 MG capsule Take 100 mg by mouth  3 (three) times daily as needed for pain.   gabapentin (NEURONTIN) 300 MG capsule Take 300 mg by mouth at bedtime as needed (pain).   HYDROcodone-acetaminophen (NORCO/VICODIN) 5-325 MG tablet Take 1 tablet by mouth 2 (two) times daily as needed for pain.   ibuprofen (ADVIL) 800 MG tablet Take 1 tablet (800 mg total) by mouth every 8 (eight) hours.   LORazepam (ATIVAN) 0.5 MG tablet Take 0.5 mg by mouth as needed for anxiety.   methocarbamol (ROBAXIN) 750 MG tablet Take 750 mg by mouth as needed for muscle spasms.   Multiple Vitamin (MULTIVITAMIN WITH MINERALS) TABS Take 1 tablet by mouth daily.   mupirocin ointment (BACTROBAN) 2 % Apply 1 application topically 2 (two) times daily.   omeprazole (PRILOSEC) 40 MG capsule Take 40 mg by mouth at bedtime.   rosuvastatin (CRESTOR) 20 MG tablet Take 20 mg by mouth daily.   sertraline (ZOLOFT) 100 MG tablet Take 100 mg by mouth Daily.    Radiology:   No results found.  Cardiac Studies:   CT Angio Chest Pulmonary Embolism (PE) W or WO Contrast 02/11/2021 Cardiovascular: Satisfactory opacification of the pulmonary arteries to the segmental level. No evidence of pulmonary embolism. Normal heart size. Small pericardial effusion. Mediastinum/Nodes: No enlarged mediastinal, hilar, or axillary lymph nodes. Thyroid gland, trachea, and esophagus demonstrate no significant findings. Lungs/Pleura: No pneumothorax or pleural effusion is noted. Mild bilateral posterior basilar subsegmental atelectasis is noted. Upper Abdomen: No acute abnormality. Musculoskeletal: No chest wall abnormality. No acute or significant osseous findings. IMPRESSION: No definite evidence of pulmonary embolus. Small pericardial effusion is noted. Mild bilateral posterior basilar subsegmental atelectasis is noted. Electronically Signed   By: Marijo Conception M.D.   On: 02/11/2021 13:15   CARDIAC CATHETERIZATION 02/11/2021 Normal coronaries Normal LVEDP and LVEDP No thoracic aorta  dissection   DG Chest [facility] 02/11/2021 Cardiomediastinal silhouette is normal. Mediastinal contours appear intact. Low lung volumes. Possible mild interstitial pulmonary edema. Left lower lobe atelectasis. Osseous structures are without acute abnormality. Soft tissues are grossly normal. IMPRESSION: Low lung volumes with possible mild interstitial pulmonary edema.  PCV ECHOCARDIOGRAM LIMITED 03/05/2021 Mildly depressed LV systolic function with visual EF 40-45%. Left ventricle cavity is normal in size. Normal left ventricular wall thickness. Hypokinetic global wall motion. Doppler evidence of grade I (impaired) diastolic dysfunction, normal LAP. Left atrial cavity is moderately dilated at 4.4 cm. Trileaflet aortic valve.  Moderate (Grade II) aortic regurgitation. Structurally normal mitral valve.  Mild (Grade I) mitral regurgitation. Structurally normal tricuspid valve.  Mild tricuspid regurgitation. No evidence of  pulmonary hypertension (PASP 24 mm Hg). Structurally normal pulmonic valve.  Mild pulmonic regurgitation. IVC is normal with blunted respiratory response. Normal right atrial pressure.63mm Hg. Compared to the study done on 08/27/2013, previously EF was 60%.  No evidence of significant pericardial effusion.  EKG:   02/26/2021: Sinus rhythm at a rate of 91 bpm.  Normal axis.  Incomplete right bundle branch block.  Nonspecific T wave abnormality.  Compared to EKG 02/11/2021, ST elevation improved  02/11/2021: Sinus tachycardia 118 bpm Inferior ST elevation, consider acute myocardial injury.  Assessment     ICD-10-CM   1. Cough  R05.9 DG Chest 2 View    2. LV dysfunction  A91.9 Basic metabolic panel    Brain natriuretic peptide    PCV ECHOCARDIOGRAM COMPLETE    3. Acute idiopathic pericarditis  I30.0        Medications Discontinued During This Encounter  Medication Reason   pantoprazole (PROTONIX) 40 MG tablet Error    No orders of the defined types were placed in this  encounter.   Recommendations:   JAYME CHAM is a 55 y.o. Caucasian male with h/o PE, remote history of peptic ulcer, admitted with acute pericarditis 02/13/2021.  Now with mildly reduced LVEF at 40-45% on repeat echocardiogram.   Patient is accompanied by his wife at bedside.  Reviewed and discussed echocardiogram results, details above.  Although patient's LVEF is mildly reduced at 40-45% he is relatively asymptomatic.  Patient's primary complaint today is continued episodes of coughing which she has been having intermittently over the last 1 year since COVID-19 infection.  We will obtain BMP, BNP, and chest x-ray to further evaluate, however suspect this is likely related to long-haul COVID symptoms.  Will obtain repeat echocardiogram in 3 months to reevaluate LVEF.  We will hold off on initiation of heart failure medications at this time as he is asymptomatic and blood pressure remains soft.  If LVEF remains reduced to repeat echo will initiate heart failure medications at that time.  In regard to pericarditis, patient's symptoms have significantly improved.  We will therefore taper ibuprofen by 200 mg weekly, reviewed taper schedule with patient and his wife who verbalized understanding agreement.  We will continue colchicine until at least September (3 months duration).   Follow-up in 3 months, sooner if needed, for LV systolic dysfunction, pericarditis.   Alethia Berthold, PA-C 03/18/2021, 1:23 PM Office: [phone]

## 2021-03-18 ENCOUNTER — Ambulatory Visit: Payer: Managed Care, Other (non HMO) | Admitting: Student

## 2021-03-18 ENCOUNTER — Encounter: Payer: Self-pay | Admitting: Student

## 2021-03-18 ENCOUNTER — Other Ambulatory Visit: Payer: Self-pay

## 2021-03-18 VITALS — BP 104/78 | HR 87 | Temp 97.9°F | Ht 71.0 in | Wt 225.0 lb

## 2021-03-18 DIAGNOSIS — R059 Cough, unspecified: Secondary | ICD-10-CM

## 2021-03-18 DIAGNOSIS — I519 Heart disease, unspecified: Secondary | ICD-10-CM

## 2021-03-18 DIAGNOSIS — I300 Acute nonspecific idiopathic pericarditis: Secondary | ICD-10-CM

## 2021-03-19 ENCOUNTER — Ambulatory Visit
Admission: RE | Admit: 2021-03-19 | Discharge: 2021-03-19 | Disposition: A | Discharge status: Home / Self Care | Payer: Managed Care, Other (non HMO) | Source: Ambulatory Visit | Attending: Student | Admitting: Student

## 2021-03-19 DIAGNOSIS — R059 Cough, unspecified: Secondary | ICD-10-CM

## 2021-03-19 LAB — BASIC METABOLIC PANEL
BUN/Creatinine Ratio: 18 (ref 9–20)
BUN: 20 mg/dL (ref 6–24)
CO2: 22 mmol/L (ref 20–29)
Calcium: 9.4 mg/dL (ref 8.7–10.2)
Chloride: 104 mmol/L (ref 96–106)
Creatinine, Ser: 1.13 mg/dL (ref 0.76–1.27)
Glucose: 83 mg/dL (ref 65–99)
Potassium: 5.1 mmol/L (ref 3.5–5.2)
Sodium: 142 mmol/L (ref 134–144)
eGFR: 77 mL/min/{1.73_m2} (ref >59)

## 2021-03-19 LAB — BRAIN NATRIURETIC PEPTIDE: BNP: 62.7 pg/mL (ref 0.0–100.0)

## 2021-03-30 ENCOUNTER — Ambulatory Visit: Payer: Managed Care, Other (non HMO) | Admitting: Student

## 2021-03-30 ENCOUNTER — Other Ambulatory Visit: Payer: Self-pay | Admitting: Student

## 2021-04-02 ENCOUNTER — Telehealth: Payer: Self-pay

## 2021-04-02 NOTE — Telephone Encounter (Signed)
Patients wife Paul Griffin called and asked if you could call her regarding some chest pressure Paul Griffin has been having and also had questions about FMLA [phone]

## 2021-04-02 NOTE — Telephone Encounter (Signed)
Spoke to patient's wife. 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 At the time of this chest pressure patient noted his heart rate to be about 120 bpm via his fitness watch. Patient rested and his heart rate came down to 90 bpm and chest pressure resolved. He states the chest pressure lasted about 30 minutes and had no other associated symptoms. Patient will continue to monitor and notify our office if he has recurrent episodes.

## 2021-04-27 ENCOUNTER — Other Ambulatory Visit: Payer: Self-pay | Admitting: Student

## 2021-04-28 ENCOUNTER — Other Ambulatory Visit: Payer: Self-pay | Admitting: Neurological Surgery

## 2021-04-28 DIAGNOSIS — M542 Cervicalgia: Secondary | ICD-10-CM

## 2021-05-18 ENCOUNTER — Other Ambulatory Visit: Payer: Self-pay | Admitting: Student

## 2021-05-26 NOTE — Progress Notes (Signed)
External labs 05/18/2021: Sed rate 6 Hemoglobin 15.3, hematocrit 46.4, MCV 87.5, platelet 206, WBC 7.66 CRP 4

## 2021-06-01 ENCOUNTER — Ambulatory Visit
Admission: RE | Admit: 2021-06-01 | Discharge: 2021-06-01 | Disposition: A | Discharge status: Home / Self Care | Payer: Managed Care, Other (non HMO) | Source: Ambulatory Visit | Attending: Neurological Surgery | Admitting: Neurological Surgery

## 2021-06-01 ENCOUNTER — Other Ambulatory Visit: Payer: Self-pay

## 2021-06-01 DIAGNOSIS — M542 Cervicalgia: Secondary | ICD-10-CM

## 2021-06-15 ENCOUNTER — Other Ambulatory Visit: Payer: Self-pay

## 2021-06-15 ENCOUNTER — Ambulatory Visit: Payer: Managed Care, Other (non HMO)

## 2021-06-15 DIAGNOSIS — I519 Heart disease, unspecified: Secondary | ICD-10-CM

## 2021-06-26 NOTE — Progress Notes (Signed)
Primary Physician/Referring:  Jarome Matin, MD  [name], male    DOB: 05/16/1966, 55 y.o.   MRN: [id number]  Chief Complaint  Patient presents with   perciarditis   Follow-up   Results   HPI:    Paul Griffin  is a 55 y.o. Caucasian male with h/o PE, remote history of peptic ulcer, admitted with acute pericarditis 02/13/2021.   Repeat echocardiogram revealed no evidence of pericardial effusion, LVEF had reduced from 60% during hospitalization to 40-45%.  Patient now presents for 28-month follow-up after repeat echocardiogram.  Echocardiogram 06/2021 noted improved LVEF from 45% to 55-60% and grade 1 diastolic dysfunction is now normal.  At last office visit tapered off of ibuprofen with plan to taper off colchicine after 3 months of therapy.  Patient has had no recurrence of chest pain.  Denies dyspnea, syncope, near syncope, dizziness, orthopnea, PND.  He is currently exercising 1 to 2 days/week without issue.  Past Medical History:  Diagnosis Date   Abdominal distension    Anemia    Anxiety    panic attacks   Blood in stool    Chronic back pain    "neck to sacral" (01/01/2013)   Clotting disorder (HCC)    PE right    Constipation    DDD (degenerative disc disease), cervical    DDD (degenerative disc disease), lumbar    DDD (degenerative disc disease), lumbosacral    DDD (degenerative disc disease), thoracolumbar    Depression    Diarrhea    Difficulty urinating    Dyslipidemia    "good cholesterol isn't as high as dr would like" (01/01/2013)   Erectile dysfunction    organic   Family history of anesthesia complication    "Mom; PONV" (01/01/2013)   Family history of colon cancer    Generalized headaches    "monthly" (01/01/2013)   GERD (gastroesophageal reflux disease)    History of blood transfusion 1980's   "related to bleeding ulcers" (01/01/2013)   Hoarseness of voice    chronic   Hyperlipidemia    Lipoma of arm 1994   right    Mitral valve prolapse    "mild" (01/01/2013)   Nasal congestion    Obstructive sleep apnea    miald w aith RDI of 6.4 per hour   Osteoarthritis    "hips & hands" (01/01/2013)   Palpitations    Peptic ulcer 1994   severe requiring vagotomy and pyloroplasty   PONV (postoperative nausea and vomiting)    Pulmonary embolism on right (HCC) 01/01/2013   Rectal bleeding    Shortness of breath    "related to the PE today" (01/01/2013)   Trouble swallowing    Tubular adenoma of colon 08/2012   Ulcer    gastric 1988   Past Surgical History:  Procedure Laterality Date   COLONOSCOPY  07/2007   showed polyps, follow up in 2013   EXCISIONAL HEMORRHOIDECTOMY     "w/colonoscopies" (01/01/2013)   HERNIA REPAIR  12/25/12   multi inc hernia   INCISIONAL HERNIA REPAIR N/A 12/25/2012   Procedure: LAPAROSCOPIC REPAIR MULITPLE INCISIONAL HERNIA WITH MESH;  Surgeon: Ernestene Mention, MD;  Location: [facility] OR;  Service: General;  Laterality: N/A;   INSERTION OF MESH N/A 12/25/2012   Procedure: INSERTION OF MESH;  Surgeon: Ernestene Mention, MD;  Location: [facility] OR;  Service: General;  Laterality: N/A;   LEFT HEART CATH AND CORONARY ANGIOGRAPHY N/A 02/11/2021   Procedure: LEFT HEART CATH AND  CORONARY ANGIOGRAPHY;  Surgeon: Nigel Mormon, MD;  Location: [facility];  Service: Cardiovascular;  Laterality: N/A;   LEFT HEART CATHETERIZATION WITH CORONARY ANGIOGRAM N/A 10/15/2013   Procedure: LEFT HEART CATHETERIZATION WITH CORONARY ANGIOGRAM;  Surgeon: Laverda Page, MD;  Location: [facility] CATH LAB;  Service: Cardiovascular;  Laterality: N/A;   POSTERIOR CERVICAL FUSION/FORAMINOTOMY  2012   fusion   THORACIC AORTOGRAM N/A 02/11/2021   Procedure: THORACIC AORTOGRAM;  Surgeon: Nigel Mormon, MD;  Location: [facility];  Service: Cardiovascular;  Laterality: N/A;   TONSILLECTOMY  2008   VAGOTOMY AND PYLOROPLASTY  1988   "partial; for ulcers" (01/01/2013)   Family History  Problem Relation Age of Onset    Colon cancer Mother    Lymphoma Mother        non- hodgkins   COPD Mother    Melanoma Mother    Colon polyps Mother    Ovarian cancer Sister    Ulcerative colitis Sister    Colon polyps Sister    Kidney cancer Father    Heart disease Father    Colon polyps Father    Breast cancer Sister    Colon polyps Sister    Cancer Sister        brain   Colon polyps Sister    Colon polyps Brother    Colon polyps Sister    Colon polyps Sister    Colon polyps Brother    Rectal cancer Neg Hx    Stomach cancer Neg Hx     Social History   Tobacco Use   Smoking status: Never   Smokeless tobacco: Never  Substance Use Topics   Alcohol use: Yes    Comment: 01/01/2013 "maybe 1-2 beers/month"   Marital Status: Married   ROS  Review of Systems  Constitutional: Negative for malaise/fatigue and weight gain.  Cardiovascular:  Negative for chest pain, claudication, leg swelling, near-syncope, orthopnea, palpitations, paroxysmal nocturnal dyspnea and syncope.  Respiratory:  Negative for cough and shortness of breath.   Neurological:  Negative for dizziness.   Objective  Blood pressure 113/84, pulse 77, temperature 98.3 F (36.8 C), temperature source Temporal, height $RemoveBeforeDE'5\' 11"'eoZqEoJdOxReXBb$  (1.803 m), weight 233 lb (105.7 kg), SpO2 98 %.  Vitals with BMI 06/29/2021 03/18/2021 02/26/2021  Height $Remov'5\' 11"'osZIyB$  $RemoveB'5\' 11"'mxHhKgjC$  $RemoveBef'5\' 11"'NCXbJwXUdU$   Weight 233 lbs 225 lbs 225 lbs 10 oz  BMI 32.51 62.94 76.54  Systolic 650 354 656  Diastolic 84 78 79  Pulse 77 87 90      Physical Exam Vitals reviewed.  Cardiovascular:     Rate and Rhythm: Normal rate and regular rhythm.     Pulses: Intact distal pulses.     Heart sounds: S1 normal and S2 normal. No murmur heard.   No gallop.  Pulmonary:     Effort: Pulmonary effort is normal. No respiratory distress.     Breath sounds: No wheezing, rhonchi or rales.  Musculoskeletal:     Right lower leg: No edema.     Left lower leg: No edema.  Skin:    General: Skin is warm and dry.   Neurological:     Mental Status: He is alert.  Physical exam unchanged compared to previous office visit  Laboratory examination:   Recent Labs    02/11/21 0745 02/11/21 0947 03/18/21 0958  NA 135  --  142  K 4.2  --  5.1  CL 100  --  104  CO2 23  --  22  GLUCOSE 114*  --  83  BUN 14  --  20  CREATININE 1.12 1.00 1.13  CALCIUM 9.1  --  9.4  GFRNONAA >60 >60  --    CrCl cannot be calculated (Patient's most recent lab result is older than the maximum 21 days allowed.).  CMP Latest Ref Rng & Units 03/18/2021 02/11/2021 02/11/2021  Glucose 65 - 99 mg/dL 83 - 114(H)  BUN 6 - 24 mg/dL 20 - 14  Creatinine 0.76 - 1.27 mg/dL 1.13 1.00 1.12  Sodium 134 - 144 mmol/L 142 - 135  Potassium 3.5 - 5.2 mmol/L 5.1 - 4.2  Chloride 96 - 106 mmol/L 104 - 100  CO2 20 - 29 mmol/L 22 - 23  Calcium 8.7 - 10.2 mg/dL 9.4 - 9.1  Total Protein 6.5 - 8.1 g/dL - - 7.6  Total Bilirubin 0.3 - 1.2 mg/dL - - 2.0(H)  Alkaline Phos 38 - 126 U/L - - 108  AST 15 - 41 U/L - - 26  ALT 0 - 44 U/L - - 25   CBC Latest Ref Rng & Units 02/11/2021 02/11/2021 01/18/2014  WBC 4.0 - 10.5 K/uL 16.0(H) 16.0(H) 8.6  Hemoglobin 13.0 - 17.0 g/dL 14.1 14.7 15.0  Hematocrit 39.0 - 52.0 % 43.5 45.0 43.9  Platelets 150 - 400 K/uL 206 240 186    Lipid Panel Recent Labs    02/11/21 0745  CHOL 114  TRIG 63  LDLCALC 61  VLDL 13  HDL 40*  CHOLHDL 2.9    HEMOGLOBIN A1C Lab Results  Component Value Date   HGBA1C 5.6 02/11/2021   MPG 114 02/11/2021   TSH No results for input(s): TSH in the last 8760 hours.  External labs:  05/18/2021: Sed rate 6 Hemoglobin 15.3, hematocrit 46.4, MCV 87.5, platelet 206, WBC 7.66 CRP 4  02/19/2021:  WBC 12.58, Hgb 12.3, HCT 36.1, PLT 398 Na 134, K 4.0, AST 62, ALT 87, Alk Phos 213, Cr 1.0, >60,  Sed rate 72 m/hr  CRP 223 mg/L   Allergies   Allergies  Allergen Reactions   Buprenorphine Hcl Other (See Comments)    hallucinations   Levitra [Vardenafil] Other (See Comments)    Morphine And Related Other (See Comments)    hallucinations    Medications Prior to Visit:   Outpatient Medications Prior to Visit  Medication Sig Dispense Refill   celecoxib (CELEBREX) 200 MG capsule Take 1 capsule by mouth in the morning and at bedtime.     clobetasol cream (TEMOVATE) 4.40 % Apply 1 application topically as needed.     diclofenac sodium (VOLTAREN) 1 % GEL Apply 2 g topically as needed (pain).     docusate sodium 100 MG CAPS Take 200 mg by mouth 2 (two) times daily. (Patient taking differently: Take 100 mg by mouth as needed (constipation).) 60 capsule 0   gabapentin (NEURONTIN) 100 MG capsule Take 100 mg by mouth 3 (three) times daily as needed for pain.     gabapentin (NEURONTIN) 300 MG capsule Take 300 mg by mouth at bedtime as needed (pain).     HYDROcodone-acetaminophen (NORCO/VICODIN) 5-325 MG tablet Take 1 tablet by mouth 2 (two) times daily as needed for pain.     LORazepam (ATIVAN) 0.5 MG tablet Take 0.5 mg by mouth as needed for anxiety.  3   methocarbamol (ROBAXIN) 750 MG tablet Take 750 mg by mouth as needed for muscle spasms.     Multiple Vitamin (MULTIVITAMIN WITH MINERALS) TABS Take 1 tablet by mouth  daily.     mupirocin ointment (BACTROBAN) 2 % Apply 1 application topically 2 (two) times daily. 30 g 0   omeprazole (PRILOSEC) 40 MG capsule Take 40 mg by mouth at bedtime.     rosuvastatin (CRESTOR) 20 MG tablet Take 20 mg by mouth daily.     sertraline (ZOLOFT) 100 MG tablet Take 100 mg by mouth Daily.      colchicine 0.6 MG tablet TAKE 1 TABLET(0.6 MG) BY MOUTH TWICE DAILY 180 tablet 0   ibuprofen (ADVIL) 800 MG tablet TAKE 1 TABLET(800 MG) BY MOUTH EVERY 8 HOURS 90 tablet 1   No facility-administered medications prior to visit.   Final Medications at End of Visit    Current Meds  Medication Sig   celecoxib (CELEBREX) 200 MG capsule Take 1 capsule by mouth in the morning and at bedtime.   clobetasol cream (TEMOVATE) 0.92 % Apply 1 application topically  as needed.   diclofenac sodium (VOLTAREN) 1 % GEL Apply 2 g topically as needed (pain).   docusate sodium 100 MG CAPS Take 200 mg by mouth 2 (two) times daily. (Patient taking differently: Take 100 mg by mouth as needed (constipation).)   gabapentin (NEURONTIN) 100 MG capsule Take 100 mg by mouth 3 (three) times daily as needed for pain.   gabapentin (NEURONTIN) 300 MG capsule Take 300 mg by mouth at bedtime as needed (pain).   HYDROcodone-acetaminophen (NORCO/VICODIN) 5-325 MG tablet Take 1 tablet by mouth 2 (two) times daily as needed for pain.   LORazepam (ATIVAN) 0.5 MG tablet Take 0.5 mg by mouth as needed for anxiety.   methocarbamol (ROBAXIN) 750 MG tablet Take 750 mg by mouth as needed for muscle spasms.   Multiple Vitamin (MULTIVITAMIN WITH MINERALS) TABS Take 1 tablet by mouth daily.   mupirocin ointment (BACTROBAN) 2 % Apply 1 application topically 2 (two) times daily.   omeprazole (PRILOSEC) 40 MG capsule Take 40 mg by mouth at bedtime.   rosuvastatin (CRESTOR) 20 MG tablet Take 20 mg by mouth daily.   sertraline (ZOLOFT) 100 MG tablet Take 100 mg by mouth Daily.    [DISCONTINUED] colchicine 0.6 MG tablet TAKE 1 TABLET(0.6 MG) BY MOUTH TWICE DAILY   [DISCONTINUED] ibuprofen (ADVIL) 800 MG tablet TAKE 1 TABLET(800 MG) BY MOUTH EVERY 8 HOURS   Radiology:   No results found.  Cardiac Studies:   CT Angio Chest Pulmonary Embolism (PE) W or WO Contrast 02/11/2021 Cardiovascular: Satisfactory opacification of the pulmonary arteries to the segmental level. No evidence of pulmonary embolism. Normal heart size. Small pericardial effusion. Mediastinum/Nodes: No enlarged mediastinal, hilar, or axillary lymph nodes. Thyroid gland, trachea, and esophagus demonstrate no significant findings. Lungs/Pleura: No pneumothorax or pleural effusion is noted. Mild bilateral posterior basilar subsegmental atelectasis is noted. Upper Abdomen: No acute abnormality. Musculoskeletal: No chest wall  abnormality. No acute or significant osseous findings. IMPRESSION: No definite evidence of pulmonary embolus. Small pericardial effusion is noted. Mild bilateral posterior basilar subsegmental atelectasis is noted. Electronically Signed   By: Marijo Conception M.D.   On: 02/11/2021 13:15   CARDIAC CATHETERIZATION 02/11/2021 Normal coronaries Normal LVEDP and LVEDP No thoracic aorta dissection   DG [facility] 02/11/2021 Cardiomediastinal silhouette is normal. Mediastinal contours appear intact. Low lung volumes. Possible mild interstitial pulmonary edema. Left lower lobe atelectasis. Osseous structures are without acute abnormality. Soft tissues are grossly normal. IMPRESSION: Low lung volumes with possible mild interstitial pulmonary edema.  PCV ECHOCARDIOGRAM COMPLETE 33/00/7622 Normal LV systolic function with visual EF  55-60%. Left ventricle cavity is normal in size. Normal global wall motion. Normal diastolic filling pattern, normal LAP. Trace aortic regurgitation. Trace tricuspid  regurgitation. No evidence of pulmonary hypertension. Mild pulmonic regurgitation. Compared to study 03/05/2021 LVEF improved from 40-45% to 55-60%, G1DD is now normal, otherwise no significant change.  EKG:   02/26/2021: Sinus rhythm at a rate of 91 bpm.  Normal axis.  Incomplete right bundle branch block.  Nonspecific T wave abnormality.  Compared to EKG 02/11/2021, ST elevation improved  02/11/2021: Sinus tachycardia 118 bpm Inferior ST elevation, consider acute myocardial injury.  Assessment     ICD-10-CM   1. Acute idiopathic pericarditis  I30.0     2. LV dysfunction  I51.9        Medications Discontinued During This Encounter  Medication Reason   ibuprofen (ADVIL) 800 MG tablet Error   colchicine 0.6 MG tablet Discontinued by provider    No orders of the defined types were placed in this encounter.   Recommendations:   DEVYON KEATOR is a 55 y.o. Caucasian male with h/o PE, remote history of  peptic ulcer, admitted with acute pericarditis 02/13/2021.  Echocardiogram following hospitalization revealed mildly reduced LVEF of 40-45%.  Patient underwent repeat echocardiogram 06/2021 which revealed LVEF had normalized to 55-60%.    Patient now presents for 56-month follow-up.  At last office visit tapered off of ibuprofen.  Reviewed and discussed with patient and his wife was present at bedside results of echocardiogram, details above.  Echocardiogram has normalized.  I personally reviewed external labs, CRP and sed rate have improved.  Patient is presently asymptomatic and is exercising regularly without issue.  Advised patient to taper off colchicine, taking it once per day for a week and then stopping it.  Patient verbalized understanding and agreement.  Otherwise patient is stable from a cardiovascular standpoint.  Follow-up as needed.  Patient will follow-up with his PCP for longitudinal care.   Alethia Berthold, PA-C 06/29/2021, 9:12 AM Office: [phone]

## 2021-06-29 ENCOUNTER — Ambulatory Visit: Payer: Managed Care, Other (non HMO) | Admitting: Student

## 2021-06-29 ENCOUNTER — Other Ambulatory Visit: Payer: Self-pay

## 2021-06-29 ENCOUNTER — Encounter: Payer: Self-pay | Admitting: Student

## 2021-06-29 VITALS — BP 113/84 | HR 77 | Temp 98.3°F | Ht 71.0 in | Wt 233.0 lb

## 2021-06-29 DIAGNOSIS — I519 Heart disease, unspecified: Secondary | ICD-10-CM

## 2021-06-29 DIAGNOSIS — I300 Acute nonspecific idiopathic pericarditis: Secondary | ICD-10-CM

## 2021-09-06 HISTORY — PX: ROTATOR CUFF REPAIR: SHX139

## 2021-09-23 ENCOUNTER — Other Ambulatory Visit: Payer: Self-pay | Admitting: Student

## 2021-11-20 ENCOUNTER — Other Ambulatory Visit: Payer: Self-pay | Admitting: Neurological Surgery

## 2021-11-20 DIAGNOSIS — M542 Cervicalgia: Secondary | ICD-10-CM

## 2021-12-21 ENCOUNTER — Ambulatory Visit
Admission: RE | Admit: 2021-12-21 | Discharge: 2021-12-21 | Disposition: A | Discharge status: Home / Self Care | Payer: Managed Care, Other (non HMO) | Source: Ambulatory Visit | Attending: Neurological Surgery | Admitting: Neurological Surgery

## 2021-12-21 DIAGNOSIS — M542 Cervicalgia: Secondary | ICD-10-CM

## 2022-01-12 ENCOUNTER — Encounter: Payer: Self-pay | Admitting: Physician Assistant

## 2022-02-08 ENCOUNTER — Encounter: Payer: Self-pay | Admitting: Physician Assistant

## 2022-02-08 ENCOUNTER — Ambulatory Visit (INDEPENDENT_AMBULATORY_CARE_PROVIDER_SITE_OTHER): Payer: Managed Care, Other (non HMO) | Admitting: Physician Assistant

## 2022-02-08 VITALS — BP 120/78 | HR 77 | Ht 71.0 in | Wt 227.4 lb

## 2022-02-08 DIAGNOSIS — K219 Gastro-esophageal reflux disease without esophagitis: Secondary | ICD-10-CM

## 2022-02-08 DIAGNOSIS — Z981 Arthrodesis status: Secondary | ICD-10-CM | POA: Diagnosis not present

## 2022-02-08 DIAGNOSIS — R1310 Dysphagia, unspecified: Secondary | ICD-10-CM | POA: Diagnosis not present

## 2022-02-08 MED ORDER — OMEPRAZOLE 40 MG PO CPDR: 40.0000 mg | DELAYED_RELEASE_CAPSULE | Freq: Every day | ORAL | 11 refills | Status: DC

## 2022-02-08 NOTE — Progress Notes (Signed)
Subjective:    [name], male    DOB: [birth year], 56 y.o.   MRN: [id number]  HPI Paul Griffin is a 56 year old white male, established with Dr. Fuller Plan, who was last seen in 2019 for colonoscopy, and last seen in the office in 2014. He comes in today for evaluation of dysphagia. Patient does have history of chronic GERD, and has been maintained on chronic PPI/omeprazole 40 mg every morning. He had EGD here in March 2014 with finding of a somewhat diffusely nodular esophagus and there were some retained solids in the stomach.  He had multiple biopsies of the esophagus which showed benign chronically inflamed squamous mucosa.  Patient says he was not having any issues with dysphagia until he underwent cervical fusion C3-4 and C6-12 August 2021.  By February 2022 notes state that he was having mild dysphagia symptoms.  Patient says those symptoms have continued over the past 15 months.  He is not having any difficulty with liquids, does have frequent dysphagia to pills and symptoms with solid food that are generally occurring on a daily basis.  He is not having any episodes requiring regurgitation but says he frequently has to do some very hard extra swallows in order to get the food to traverse.  He also mentions a popping feeling that he gets in his neck which has been present since surgery and sometimes occurs with swallowing.  He denies any heartburn or indigestion.  Appetite has been okay.  CT of the cervical spine was done April 2023/Dr. Sherley Bounds and impression reads as C3-C7 ACDF with solid arthrodesis, on convertible/facet spurring causing chronic high-grade foraminal impingement on the left at C2-3, right C3-4 and left C6-7.  Patient also has history of acute idiopathic pericarditis 2022, cath showed normal coronaries.  Patient has family history of colon cancer in his mother, diagnosed in her 48s. Personal history of adenomatous colon polyps 2013, and last colonoscopy in  April 2019 with removal of 9 polyps, largest 4 to 5 mm and noted to have internal hemorrhoids.  There was also noted to be a diffuse area of moderate nodular mucosa in the descending and transverse colon with prominent lymphoid tissue. 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 Recommended for 3-year interval follow-up.  Review of Systems Pertinent positive and negative review of systems were noted in the above HPI section.  All other review of systems was otherwise negative.   Outpatient Encounter Medications as of 02/08/2022  Medication Sig   celecoxib (CELEBREX) 200 MG capsule Take 1 capsule by mouth in the morning and at bedtime.   clobetasol cream (TEMOVATE) 4.17 % Apply 1 application topically as needed.   diclofenac sodium (VOLTAREN) 1 % GEL Apply 2 g topically as needed (pain).   gabapentin (NEURONTIN) 100 MG capsule Take 100 mg by mouth 3 (three) times daily as needed for pain.   HYDROcodone-acetaminophen (NORCO/VICODIN) 5-325 MG tablet Take 1 tablet by mouth 2 (two) times daily as needed for pain.   LORazepam (ATIVAN) 0.5 MG tablet Take 0.5 mg by mouth as needed for anxiety.   methocarbamol (ROBAXIN) 750 MG tablet Take 750 mg by mouth as needed for muscle spasms.   Multiple Vitamin (MULTIVITAMIN WITH MINERALS) TABS Take 1 tablet by mouth daily.   mupirocin ointment (BACTROBAN) 2 % Apply 1 application topically 2 (two) times daily.   rosuvastatin (CRESTOR) 20 MG tablet Take 20 mg by mouth daily.   sertraline (ZOLOFT) 100 MG tablet  Take 100 mg by mouth Daily.    [DISCONTINUED] omeprazole (PRILOSEC) 40 MG capsule Take 40 mg by mouth at bedtime.   omeprazole (PRILOSEC) 40 MG capsule Take 1 capsule (40 mg total) by mouth daily before breakfast.   [DISCONTINUED] docusate sodium 100 MG CAPS Take 200 mg by mouth 2 (two) times daily. (Patient not taking: Reported on 02/08/2022)   [DISCONTINUED] gabapentin (NEURONTIN) 300 MG capsule  Take 300 mg by mouth at bedtime as needed (pain).   [DISCONTINUED] ibuprofen (ADVIL) 800 MG tablet TAKE 1 TABLET(800 MG) BY MOUTH EVERY 8 HOURS (Patient not taking: Reported on 02/08/2022)   No facility-administered encounter medications on file as of 02/08/2022.   Allergies  Allergen Reactions   Buprenorphine Hcl Other (See Comments)    hallucinations   Levitra [Vardenafil] Other (See Comments)   Morphine And Related Other (See Comments)    hallucinations   Patient Active Problem List   Diagnosis Date Noted   Pericardial effusion    Leucocytosis    STEMI (ST elevation myocardial infarction) ([place]) 02/11/2021   Pericarditis 02/11/2021   Precordial pain    ST elevation    Pulmonary embolism ([place]) 01/01/2013   Unspecified constipation 01/01/2013   Obstructive sleep apnea 01/01/2013   Hemoptysis 01/01/2013   Incisional hernia, without obstruction or gangrene 10/20/2012   ANXIETY 11/03/2007   INTERNAL HEMORRHOIDS WITHOUT MENTION COMP 11/03/2007   EXTERNAL HEMORRHOIDS WITHOUT MENTION COMP 11/03/2007   RECTAL POLYPS 11/03/2007   HEMATOCHEZIA 11/03/2007   ARTHRITIS 11/03/2007   DUODENAL ULCER, HX OF 11/03/2007   Social History   Socioeconomic History   Marital status: Married    Spouse name: Not on file   Number of children: 2   Years of education: Not on file   Highest education level: Not on file  Occupational History    Employer: [employer]  Tobacco Use   Smoking status: Never   Smokeless tobacco: Never  Vaping Use   Vaping Use: Never used  Substance and Sexual Activity   Alcohol use: Yes    Comment: 01/01/2013 "maybe 1-2 beers/month"   Drug use: No   Sexual activity: Yes  Other Topics Concern   Not on file  Social History Narrative   Not on file   Social Determinants of Health   Financial Resource Strain: Not on file  Food Insecurity: Not on file  Transportation Needs: Not on file  Physical Activity: Not on file  Stress: Not on file  Social Connections: Not on file   Intimate Partner Violence: Not on file    Mr. Paul Griffin family history includes Breast cancer in his sister; COPD in his mother; Cancer in his sister; Colon cancer in his mother; Colon polyps in his brother, brother, father, mother, sister, sister, sister, sister, and sister; Heart disease in his father; Kidney cancer in his father; Lymphoma in his mother; Melanoma in his mother; Ovarian cancer in his sister; Ulcerative colitis in his sister.      Objective:    Vitals:   02/08/22 1346  BP: 120/78  Pulse: 77  SpO2: 96%    Physical Exam Well-developed well-nourished  older WM  in no acute distress.  Weight, 227 BMI 31  HEENT; nontraumatic normocephalic, EOMI, PE R LA, sclera anicteric.-Hoarse, wet vocal quality Oropharynx; not examined today Neck; supple, no JVD, transverse incisional scar Cardiovascular; regular rate and rhythm with S1-S2, no murmur rub or gallop Pulmonary; Clear bilaterally Abdomen; soft, nontender, nondistended, no palpable mass or hepatosplenomegaly, bowel sounds are active Rectal; not  done Skin; benign exam, no jaundice rash or appreciable lesions Extremities; no clubbing cyanosis or edema skin warm and dry Neuro/Psych; alert and oriented x4, grossly nonfocal mood and affect appropriate        Assessment & Plan:   #39 56 year old white male with over 1 year history of dysphagia, intermittent but very frequent symptoms primarily occurring with solid foods and pills.  Onset of symptoms status post cervical fusion December 2021. Patient has also had chronic hoarseness since that time. He does have history of chronic GERD, well controlled with omeprazole 40 mg p.o. daily. Last EGD 2014 with somewhat diffuse nodular appearing esophagus, no stricture, and some retained solid food in the stomach.  Biopsies showed benign chronically inflamed squamous mucosa.  I suspect his current symptoms are secondary to the cervical fusion, with probable extrinsic compression  of the esophagus, rather than peptic stricture  #2 personal history of adenomatous colon polyps 2013, last colonoscopy 2019 with 9 polyps removed, however path was consistent with nodular lymphoid aggregates. Also noted to have diffuse area of moderate nodular mucosa in the transverse and descending colon with prominent lymphoid tissue. Recommendation was for 3-year interval follow-up  #3 family history of colon cancer-mother #4 sleep apnea #5.  History of acute idiopathic pericarditis  #6 probable subacute rotator cuff tear on the right  Plan; we will schedule for barium swallow with a tablet, as initial evaluation. We will ask Dr. Fuller Plan to review path from patient's last colonoscopy 2019 as outlined above, and give recommendation regarding timing of interval follow-up.  At this point he would be overdue for 3-year interval follow-up.  Further recommendations pending findings of barium swallow. Continue Protonix 40 mg p.o. every morning  Alanzo Lamb S Teressa Mcglocklin PA-C 02/08/2022   Cc: Donnajean Lopes, MD

## 2022-02-08 NOTE — Patient Instructions (Addendum)
If you are age 56 or younger, your body mass index should be between 19-25. Your Body mass index is 31.72 kg/m. If this is out of the aformentioned range listed, please consider follow up with your Primary Care Provider.  ________________________________________________________  The [facility] providers would like to encourage you to use [facility] to communicate with providers for non-urgent requests or questions.  Due to long hold times on the telephone, sending your provider a message by [facility] may be a faster and more efficient way to get a response.  Please allow 48 business hours for a response.  Please remember that this is for non-urgent requests.  _______________________________________________________  Dennis Bast have been scheduled for a Barium Esophogram at [facility] (1st floor of the hospital) on 02/15/2022 at 9:00 am. Please arrive 15 minutes prior to your appointment for registration. Make certain not to have anything to eat or drink 3 hours prior to your test. If you need to reschedule for any reason, please contact radiology at [phone] to do so. __________________________________________________________________ A barium swallow is an examination that concentrates on views of the esophagus. This tends to be a double contrast exam (barium and two liquids which, when combined, create a gas to distend the wall of the oesophagus) or single contrast (non-ionic iodine based). The study is usually tailored to your symptoms so a good history is essential. Attention is paid during the study to the form, structure and configuration of the esophagus, looking for functional disorders (such as aspiration, dysphagia, achalasia, motility and reflux) EXAMINATION You may be asked to change into a gown, depending on the type of swallow being performed. A radiologist and radiographer will perform the procedure. The radiologist will advise you of the type of contrast selected for your procedure and  direct you during the exam. You will be asked to stand, sit or lie in several different positions and to hold a small amount of fluid in your mouth before being asked to swallow while the imaging is performed .In some instances you may be asked to swallow barium coated marshmallows to assess the motility of a solid food bolus. The exam can be recorded as a digital or video fluoroscopy procedure. POST PROCEDURE It will take 1-2 days for the barium to pass through your system. To facilitate this, it is important, unless otherwise directed, to increase your fluids for the next 24-48hrs and to resume your normal diet.  This test typically takes about 30 minutes to perform. __________________________________________________________________________________  Continue Omeprazole 40 mg 1 capsule daily before breakfast.  Follow up pending the results of your Barium Swallow.  Thank you for entrusting me with your care and choosing [facility].  Amy Esterwood, PA-C

## 2022-02-08 NOTE — Progress Notes (Signed)
Given his family history of colon cancer, his personal history of adenomatous colon polyps in 2013 and bowel prep in 2019 that was just adequate I recommended a 3 year interval colonoscopy (instead of the 5 year interval) with an extended bowel prep. If his prep were better in 2019 I feel 5 years would have been appropriate.

## 2022-02-09 ENCOUNTER — Telehealth: Payer: Self-pay

## 2022-02-09 NOTE — Telephone Encounter (Signed)
-----   Message from Alfredia Ferguson, PA-C sent at 02/09/2022 11:30 AM EDT ----- Regarding: colonoscopy I saw this pt yesterday  in office - please call him and let him know Dr Fuller Plan reviewed his last Colon and path and is recommending 3 year interval follow up so he is due for follow up Colon .  I scheduled him for Ba swallow -lets wait for result of that and then can decide if he needs to be scheduled for Colon and EGD  or just Colon   Thanks

## 2022-02-09 NOTE — Telephone Encounter (Signed)
Called the patient to discuss. No answer. Left a message asking he return my call.

## 2022-02-09 NOTE — Telephone Encounter (Signed)
Patient advised. He and spouse agree to this plan.

## 2022-02-15 ENCOUNTER — Ambulatory Visit (HOSPITAL_COMMUNITY)
Admission: RE | Admit: 2022-02-15 | Discharge: 2022-02-15 | Disposition: A | Discharge status: Home / Self Care | Payer: Managed Care, Other (non HMO) | Source: Ambulatory Visit | Attending: Physician Assistant | Admitting: Physician Assistant

## 2022-02-15 DIAGNOSIS — Z981 Arthrodesis status: Secondary | ICD-10-CM | POA: Diagnosis present

## 2022-02-15 DIAGNOSIS — R1310 Dysphagia, unspecified: Secondary | ICD-10-CM | POA: Insufficient documentation

## 2022-02-16 ENCOUNTER — Telehealth: Payer: Self-pay | Admitting: *Deleted

## 2022-02-16 NOTE — Telephone Encounter (Signed)
Paul Griffin,  This pt is cleared for anesthetic care at [facility].   Thanks,  Osvaldo Angst

## 2022-03-25 ENCOUNTER — Encounter: Payer: Self-pay | Admitting: Gastroenterology

## 2022-04-12 ENCOUNTER — Encounter: Payer: Managed Care, Other (non HMO) | Admitting: Gastroenterology

## 2022-04-26 ENCOUNTER — Ambulatory Visit (AMBULATORY_SURGERY_CENTER): Payer: Self-pay

## 2022-04-26 VITALS — Ht 71.0 in | Wt 234.0 lb

## 2022-04-26 DIAGNOSIS — R1310 Dysphagia, unspecified: Secondary | ICD-10-CM

## 2022-04-26 DIAGNOSIS — Z86010 Personal history of colonic polyps: Secondary | ICD-10-CM

## 2022-04-26 MED ORDER — PLENVU 140 G PO SOLR: 1.0000 | Freq: Once | ORAL | 0 refills | Status: AC

## 2022-04-26 NOTE — Progress Notes (Signed)
No egg or soy allergy known to patient  No issues known to pt with past sedation with any surgeries or procedures Patient denies ever being told they had issues or difficulty with intubation  No FH of Malignant Hyperthermia Pt is not on diet pills Pt is not on  home 02  Pt is not on blood thinners  Pt denies issues with constipation  No A fib or A flutter Have any cardiac testing pending--denied Pt instructed to use [email or web address] or GoodRx for a price reduction on prep   

## 2022-05-14 ENCOUNTER — Encounter: Payer: Self-pay | Admitting: Gastroenterology

## 2022-05-31 ENCOUNTER — Ambulatory Visit (AMBULATORY_SURGERY_CENTER): Payer: Managed Care, Other (non HMO) | Admitting: Gastroenterology

## 2022-05-31 ENCOUNTER — Encounter: Payer: Self-pay | Admitting: Gastroenterology

## 2022-05-31 VITALS — BP 110/74 | HR 59 | Temp 98.1°F | Resp 14 | Ht 71.0 in | Wt 234.0 lb

## 2022-05-31 DIAGNOSIS — K5140 Inflammatory polyps of colon without complications: Secondary | ICD-10-CM

## 2022-05-31 DIAGNOSIS — D124 Benign neoplasm of descending colon: Secondary | ICD-10-CM

## 2022-05-31 DIAGNOSIS — D122 Benign neoplasm of ascending colon: Secondary | ICD-10-CM

## 2022-05-31 DIAGNOSIS — K219 Gastro-esophageal reflux disease without esophagitis: Secondary | ICD-10-CM

## 2022-05-31 DIAGNOSIS — K3189 Other diseases of stomach and duodenum: Secondary | ICD-10-CM

## 2022-05-31 DIAGNOSIS — Z09 Encounter for follow-up examination after completed treatment for conditions other than malignant neoplasm: Secondary | ICD-10-CM | POA: Diagnosis present

## 2022-05-31 DIAGNOSIS — R1310 Dysphagia, unspecified: Secondary | ICD-10-CM | POA: Diagnosis not present

## 2022-05-31 DIAGNOSIS — Z86010 Personal history of colonic polyps: Secondary | ICD-10-CM

## 2022-05-31 DIAGNOSIS — K229 Disease of esophagus, unspecified: Secondary | ICD-10-CM | POA: Diagnosis not present

## 2022-05-31 DIAGNOSIS — K635 Polyp of colon: Secondary | ICD-10-CM

## 2022-05-31 DIAGNOSIS — D123 Benign neoplasm of transverse colon: Secondary | ICD-10-CM

## 2022-05-31 MED ORDER — SODIUM CHLORIDE 0.9 % IV SOLN
500.0000 mL | Freq: Once | INTRAVENOUS | Status: DC
Start: 2022-05-31 — End: 2022-05-31

## 2022-05-31 NOTE — Op Note (Signed)
[facility] Patient Name: Paul Griffin Procedure Date: 05/31/2022 12:24 PM MRN: [id number] Endoscopist: Ladene Artist , MD Age: 56 Referring MD:  Date of Birth: 1966-01-23 Gender: Male Account #: [id number] Procedure:                Colonoscopy Indications:              Surveillance: Personal history of adenomatous                            polyps on last colonoscopy > 3 years ago Medicines:                Monitored Anesthesia Care Procedure:                Pre-Anesthesia Assessment:                           - Prior to the procedure, a History and Physical                            was performed, and patient medications and                            allergies were reviewed. The patient's tolerance of                            previous anesthesia was also reviewed. The risks                            and benefits of the procedure and the sedation                            options and risks were discussed with the patient.                            All questions were answered, and informed consent                            was obtained. Prior Anticoagulants: The patient has                            taken no previous anticoagulant or antiplatelet                            agents. ASA Grade Assessment: II - A patient with                            mild systemic disease. After reviewing the risks                            and benefits, the patient was deemed in                            satisfactory condition to undergo the procedure.  After obtaining informed consent, the colonoscope                            was passed under direct vision. Throughout the                            procedure, the patient's blood pressure, pulse, and                            oxygen saturations were monitored continuously. The                            CF [id number] #[id number] was introduced through the anus                            and advanced to  the the cecum, identified by                            appendiceal orifice and ileocecal valve. The                            ileocecal valve, appendiceal orifice, and rectum                            were photographed. The quality of the bowel                            preparation was adequate after extensive lavage,                            suction. The colonoscopy was somewhat difficult due                            to significant looping and a tortuous colon. The                            patient tolerated the procedure well. Scope In: 1:38:06 PM Scope Out: 2:01:34 PM Scope Withdrawal Time: 0 hours 17 minutes 21 seconds  Total Procedure Duration: 0 hours 23 minutes 28 seconds  Findings:                 The perianal and digital rectal examinations were                            normal.                           A 4 mm polyp was found in the transverse colon. The                            polyp was sessile. The polyp was removed with a                            cold biopsy forceps. Resection and retrieval were  complete.                           Five sessile polyps were found in the descending                            colon (2), transverse colon (2) and ascending colon                            (1). The polyps were 5 to 7 mm in size. These                            polyps were removed with a cold snare. Resection                            and retrieval were complete.                           Internal hemorrhoids were found during                            retroflexion. The hemorrhoids were small and Grade                            I (internal hemorrhoids that do not prolapse).                           The exam was otherwise without abnormality on                            direct and retroflexion views. Complications:            No immediate complications. Estimated blood loss:                            None. Estimated Blood Loss:      Estimated blood loss: none. Impression:               - One 4 mm polyp in the transverse colon, removed                            with a cold biopsy forceps. Resected and retrieved.                           - Five 5 to 7 mm polyps in the descending colon, in                            the transverse colon and in the ascending colon,                            removed with a cold snare. Resected and retrieved.                           - Internal hemorrhoids.                           -  The examination was otherwise normal on direct                            and retroflexion views. Recommendation:           - Repeat colonoscopy after studies are complete for                            surveillance based on pathology results.                           - Patient has a contact number available for                            emergencies. The signs and symptoms of potential                            delayed complications were discussed with the                            patient. Return to normal activities tomorrow.                            Written discharge instructions were provided to the                            patient.                           - Resume previous diet.                           - Continue present medications.                           - Await pathology results. Ladene Artist, MD 05/31/2022 2:06:48 PM This report has been signed electronically.

## 2022-05-31 NOTE — Op Note (Signed)
[facility] Patient Name: Paul Griffin Procedure Date: 05/31/2022 12:23 PM MRN: [id number] Endoscopist: Ladene Artist , MD Age: 56 Referring MD:  Date of Birth: December 21, 1965 Gender: Male Account #: [id number] Procedure:                Upper GI endoscopy Indications:              Dysphagia, Gastroesophageal reflux disease Medicines:                Monitored Anesthesia Care Procedure:                Pre-Anesthesia Assessment:                           - Prior to the procedure, a History and Physical                            was performed, and patient medications and                            allergies were reviewed. The patient's tolerance of                            previous anesthesia was also reviewed. The risks                            and benefits of the procedure and the sedation                            options and risks were discussed with the patient.                            All questions were answered, and informed consent                            was obtained. Prior Anticoagulants: The patient has                            taken no previous anticoagulant or antiplatelet                            agents. ASA Grade Assessment: II - A patient with                            mild systemic disease. After reviewing the risks                            and benefits, the patient was deemed in                            satisfactory condition to undergo the procedure.                           After obtaining informed consent, the endoscope was  passed under direct vision. Throughout the                            procedure, the patient's blood pressure, pulse, and                            oxygen saturations were monitored continuously. The                            GIF HQ190 #[id number] was introduced through the                            mouth, and advanced to the lower third of                            esophagus. The upper  GI endoscopy was accomplished                            without difficulty. The patient tolerated the                            procedure well. Scope In: Scope Out: Findings:                 Segmental nodular mucosa was found in the proximal                            esophagus and in the mid esophagus. Biopsies were                            taken with a cold forceps for histology.                           No endoscopic abnormality was evident in the                            esophagus to explain the patient's complaint of                            dysphagia. It was decided, however, to proceed with                            dilation of the entire esophagus. A guidewire was                            placed and the scope was withdrawn. Dilation was                            performed with a Savary dilator with no resistance                            at 17 mm.  The entire examined stomach was normal.                           The duodenal bulb and second portion of the                            duodenum were normal. Complications:            No immediate complications. Estimated Blood Loss:     Estimated blood loss was minimal. Impression:               - Esophageal nodular mucosal changes were present.                            Findings are suggestive of inflammation. Biopsied.                           - No endoscopic esophageal abnormality to explain                            patient's dysphagia. Esophagus dilated.                           - Normal stomach.                           - Normal duodenal bulb and second portion of the                            duodenum. Recommendation:           - Patient has a contact number available for                            emergencies. The signs and symptoms of potential                            delayed complications were discussed with the                            patient. Return to normal activities  tomorrow.                            Written discharge instructions were provided to the                            patient.                           - Clear liquid diet for 2 hours, then advance as                            tolerated to soft diet today.                           - Resume prior diet tomorrow.                           -  Continue present medications.                           - Await pathology results. Ladene Artist, MD 05/31/2022 2:34:33 PM This report has been signed electronically.

## 2022-05-31 NOTE — Progress Notes (Signed)
History & Physical  Primary Care Physician:  Donnajean Lopes, MD Primary Gastroenterologist: Lucio Edward, MD  CHIEF COMPLAINT:  Personal history of colon polyps, GERD, dysphagia   HPI: Paul Griffin is a 56 y.o. male with a personal history of adenomatous colon polyps for colonoscopy.  GERD and dysphagia for EGD.   Past Medical History:  Diagnosis Date   Abdominal distension    Anemia    Anxiety    panic attacks   Blood in stool    Chronic back pain    "neck to sacral" (01/01/2013)   Clotting disorder ([place])    PE right    Constipation    DDD (degenerative disc disease), cervical    DDD (degenerative disc disease), lumbar    DDD (degenerative disc disease), lumbosacral    DDD (degenerative disc disease), thoracolumbar    Depression    Diarrhea    Difficulty urinating    Dyslipidemia    "good cholesterol isn't as high as dr would like" (01/01/2013)   Erectile dysfunction    organic   Family history of anesthesia complication    "Mom; PONV" (01/01/2013)   Family history of colon cancer    Generalized headaches    "monthly" (01/01/2013)   GERD (gastroesophageal reflux disease)    History of blood transfusion 1980's   "related to bleeding ulcers" (01/01/2013)   Hoarseness of voice    chronic   Hyperlipidemia    Lipoma of arm 1994   right   Mitral valve prolapse    "mild" (01/01/2013)   Nasal congestion    Obstructive sleep apnea    miald w aith RDI of 6.4 per hour   Osteoarthritis    "hips & hands" (01/01/2013)   Palpitations    Peptic ulcer 1994   severe requiring vagotomy and pyloroplasty   PONV (postoperative nausea and vomiting)    Pulmonary embolism on right ([place]) 01/01/2013   Rectal bleeding    Shortness of breath    "related to the PE today" (01/01/2013)   Trouble swallowing    Tubular adenoma of colon 08/2012   Ulcer    gastric 1988    Past Surgical History:  Procedure Laterality Date   COLONOSCOPY  07/08/2007   showed polyps, follow up  in 2013   EXCISIONAL HEMORRHOIDECTOMY     "w/colonoscopies" (01/01/2013)   HERNIA REPAIR  12/25/2012   multi inc hernia   INCISIONAL HERNIA REPAIR N/A 12/25/2012   Procedure: LAPAROSCOPIC REPAIR MULITPLE INCISIONAL HERNIA WITH MESH;  Surgeon: Adin Hector, MD;  Location: [facility];  Service: General;  Laterality: N/A;   INSERTION OF MESH N/A 12/25/2012   Procedure: INSERTION OF MESH;  Surgeon: Adin Hector, MD;  Location: [facility];  Service: General;  Laterality: N/A;   LEFT HEART CATH AND CORONARY ANGIOGRAPHY N/A 02/11/2021   Procedure: LEFT HEART CATH AND CORONARY ANGIOGRAPHY;  Surgeon: Nigel Mormon, MD;  Location: [facility];  Service: Cardiovascular;  Laterality: N/A;   LEFT HEART CATHETERIZATION WITH CORONARY ANGIOGRAM N/A 10/15/2013   Procedure: LEFT HEART CATHETERIZATION WITH CORONARY ANGIOGRAM;  Surgeon: Laverda Page, MD;  Location: [facility] C[facility];  Service: Cardiovascular;  Laterality: N/A;   LUMBAR FUSION     2020?   POSTERIOR CERVICAL FUSION/FORAMINOTOMY  09/06/2010   fusion   ROTATOR CUFF REPAIR Right 2023   THORACIC AORTOGRAM N/A 02/11/2021   Procedure: THORACIC AORTOGRAM;  Surgeon: Nigel Mormon, MD;  Location: [place];  Service: Cardiovascular;  Laterality: N/A;   TONSILLECTOMY  09/06/2006   VAGOTOMY AND PYLOROPLASTY  09/06/1986   "partial; for ulcers" (01/01/2013)    Prior to Admission medications   Medication Sig Start Date End Date Taking? Authorizing Provider  omeprazole (PRILOSEC) 40 MG capsule Take 1 capsule (40 mg total) by mouth daily before breakfast. 02/08/22  Yes Esterwood, Amy S, PA-C  sertraline (ZOLOFT) 100 MG tablet Take 100 mg by mouth Daily.  08/08/12  Yes [provider]  celecoxib (CELEBREX) 200 MG capsule Take 1 capsule by mouth in the morning and at bedtime.    [provider]  chlorhexidine (PERIDEX) 0.12 % solution 15 mLs 2 (two) times daily. 05/24/22   [provider]  diclofenac sodium  (VOLTAREN) 1 % GEL Apply 2 g topically as needed (pain). Patient not taking: Reported on 04/26/2022 02/15/12   [provider]  gabapentin (NEURONTIN) 100 MG capsule Take 100 mg by mouth 3 (three) times daily as needed for pain. 01/26/21   [provider]  HYDROcodone-acetaminophen (NORCO/VICODIN) 5-325 MG tablet Take 1 tablet by mouth 2 (two) times daily as needed for pain. 01/27/21   [provider]  LORazepam (ATIVAN) 0.5 MG tablet Take 0.5 mg by mouth as needed for anxiety. Patient not taking: Reported on 04/26/2022 11/29/17   [provider]  metaxalone (SKELAXIN) 800 MG tablet TAKE 1 TABLET BY MOUTH AT BEDTIME AS NEEDED FOR MUSCLE SPASMS    [provider]  Multiple Vitamin (MULTIVITAMIN WITH MINERALS) TABS Take 1 tablet by mouth daily.    [provider]  oxyCODONE-acetaminophen (PERCOCET/ROXICET) 5-325 MG tablet Take 1 tablet by mouth 2 (two) times daily as needed. 04/13/22   [provider]    Current Outpatient Medications  Medication Sig Dispense Refill   omeprazole (PRILOSEC) 40 MG capsule Take 1 capsule (40 mg total) by mouth daily before breakfast. 30 capsule 11   sertraline (ZOLOFT) 100 MG tablet Take 100 mg by mouth Daily.      celecoxib (CELEBREX) 200 MG capsule Take 1 capsule by mouth in the morning and at bedtime.     chlorhexidine (PERIDEX) 0.12 % solution 15 mLs 2 (two) times daily.     diclofenac sodium (VOLTAREN) 1 % GEL Apply 2 g topically as needed (pain). (Patient not taking: Reported on 04/26/2022)     gabapentin (NEURONTIN) 100 MG capsule Take 100 mg by mouth 3 (three) times daily as needed for pain.     HYDROcodone-acetaminophen (NORCO/VICODIN) 5-325 MG tablet Take 1 tablet by mouth 2 (two) times daily as needed for pain.     LORazepam (ATIVAN) 0.5 MG tablet Take 0.5 mg by mouth as needed for anxiety. (Patient not taking: Reported on 04/26/2022)  3   metaxalone (SKELAXIN) 800 MG tablet TAKE 1 TABLET BY MOUTH AT  BEDTIME AS NEEDED FOR MUSCLE SPASMS     Multiple Vitamin (MULTIVITAMIN WITH MINERALS) TABS Take 1 tablet by mouth daily.     oxyCODONE-acetaminophen (PERCOCET/ROXICET) 5-325 MG tablet Take 1 tablet by mouth 2 (two) times daily as needed.     Current Facility-Administered Medications  Medication Dose Route Frequency Provider Last Rate Last Admin   0.9 %  sodium chloride infusion  500 mL Intravenous Once Ladene Artist, MD        Allergies as of 05/31/2022 - Review Complete 05/31/2022  Allergen Reaction Noted   Buprenorphine hcl Other (See Comments) 11/22/2014   Levitra [vardenafil] Other (See Comments) 05/18/2021   Morphine and related Other (See Comments) 08/14/2012  Family History  Problem Relation Age of Onset   Colon cancer Mother    Lymphoma Mother        non- hodgkins   COPD Mother    Melanoma Mother    Colon polyps Mother    Kidney cancer Father    Heart disease Father    Colon polyps Father    Ovarian cancer Sister    Ulcerative colitis Sister    Colon polyps Sister    Breast cancer Sister    Colon polyps Sister    Cancer Sister        brain   Colon polyps Sister    Colon polyps Sister    Colon polyps Sister    Colon polyps Brother    Colon polyps Brother    Rectal cancer Neg Hx    Stomach cancer Neg Hx    Esophageal cancer Neg Hx     Social History   Socioeconomic History   Marital status: Married    Spouse name: Not on file   Number of children: 2   Years of education: Not on file   Highest education level: Not on file  Occupational History    Employer: [employer]  Tobacco Use   Smoking status: Never   Smokeless tobacco: Never  Vaping Use   Vaping Use: Never used  Substance and Sexual Activity   Alcohol use: Yes    Comment: 01/01/2013 "maybe 1-2 beers/month"   Drug use: No   Sexual activity: Yes  Other Topics Concern   Not on file  Social History Narrative   Not on file   Social Determinants of Health   Financial Resource Strain: Not on  file  Food Insecurity: Not on file  Transportation Needs: Not on file  Physical Activity: Not on file  Stress: Not on file  Social Connections: Not on file  Intimate Partner Violence: Not on file    Review of Systems:  All systems reviewed were negative except where noted in HPI.   Physical Exam: General:  Alert, well-developed, in NAD Head:  Normocephalic and atraumatic. Eyes:  Sclera clear, no icterus.   Conjunctiva pink. Ears:  Normal auditory acuity. Mouth:  No deformity or lesions.  Neck:  Supple; no masses . Lungs:  Clear throughout to auscultation.   No wheezes, crackles, or rhonchi. No acute distress. Heart:  Regular rate and rhythm; no murmurs. Abdomen:  Soft, nondistended, nontender. No masses, hepatomegaly. No obvious masses.  Normal bowel .    Rectal:  Deferred   Msk:  Symmetrical without gross deformities.. Pulses:  Normal pulses noted. Extremities:  Without edema. Neurologic:  Alert and  oriented x4;  grossly normal neurologically. Skin:  Intact without significant lesions or rashes. Cervical Nodes:  No significant cervical adenopathy. Psych:  Alert and cooperative. Normal mood and affect.   Impression / Plan:   Personal history of adenomatous colon polyps for colonoscopy.   GERD and dysphagia for EGD.  Pricilla Riffle. Fuller Plan  05/31/2022, 1:31 PM See Shea Evans, [facility], to contact our on call provider

## 2022-05-31 NOTE — Progress Notes (Signed)
Vitals-CW Pt's states no medical or surgical changes since previsit or office visit.   Patient has a "shaky' voice.  States that his voice has been that way since he had his neck surgery.  States chronic back pain.  Patient denies NSTEMI.

## 2022-05-31 NOTE — Progress Notes (Signed)
Called to room to assist during endoscopic procedure.  Patient ID and intended procedure confirmed with present staff. Received instructions for my participation in the procedure from the performing physician.  

## 2022-05-31 NOTE — Patient Instructions (Signed)
Handouts provided on polyps and hemorrhoids.   Post dilation diet (see handout).   Await pathology results. Continue present medications.   YOU HAD AN ENDOSCOPIC PROCEDURE TODAY AT [facility]:   Refer to the procedure report that was given to you for any specific questions about what was found during the examination.  If the procedure report does not answer your questions, please call your gastroenterologist to clarify.  If you requested that your care partner not be given the details of your procedure findings, then the procedure report has been included in a sealed envelope for you to review at your convenience later.  YOU SHOULD EXPECT: Some feelings of bloating in the abdomen. Passage of more gas than usual.  Walking can help get rid of the air that was put into your GI tract during the procedure and reduce the bloating. If you had a lower endoscopy (such as a colonoscopy or flexible sigmoidoscopy) you may notice spotting of blood in your stool or on the toilet paper. If you underwent a bowel prep for your procedure, you may not have a normal bowel movement for a few days.  Please Note:  You might notice some irritation and congestion in your nose or some drainage.  This is from the oxygen used during your procedure.  There is no need for concern and it should clear up in a day or so.  SYMPTOMS TO REPORT IMMEDIATELY:  Following lower endoscopy (colonoscopy or flexible sigmoidoscopy):  Excessive amounts of blood in the stool  Significant tenderness or worsening of abdominal pains  Swelling of the abdomen that is new, acute  Fever of 100F or higher  Following upper endoscopy (EGD)  Vomiting of blood or coffee ground material  New chest pain or pain under the shoulder blades  Painful or persistently difficult swallowing  New shortness of breath  Fever of 100F or higher  Black, tarry-looking stools  For urgent or emergent issues, a gastroenterologist can be reached at  any hour by calling [phone]. Do not use MyChart messaging for urgent concerns.    DIET:  Post dilation diet: Clear liquids for 2 hours (until 4:30pm). Then, a Soft diet (see handout) for the rest of today. You may resume your regular diet tomorrow. Drink plenty of fluids but you should avoid alcoholic beverages for 24 hours.  ACTIVITY:  You should plan to take it easy for the rest of today and you should NOT DRIVE or use heavy machinery until tomorrow (because of the sedation medicines used during the test).    FOLLOW UP: Our staff will call the number listed on your records the next business day following your procedure.  We will call around 7:15- 8:00 am to check on you and address any questions or concerns that you may have regarding the information given to you following your procedure. If we do not reach you, we will leave a message.     If any biopsies were taken you will be contacted by phone or by letter within the next 1-3 weeks.  Please call us at [phone] if you have not heard about the biopsies in 3 weeks.    SIGNATURES/CONFIDENTIALITY: You and/or your care partner have signed paperwork which will be entered into your electronic medical record.  These signatures attest to the fact that that the information above on your After Visit Summary has been reviewed and is understood.  Full responsibility of the confidentiality of this discharge information lies with you and/or  your care-partner.

## 2022-05-31 NOTE — Progress Notes (Signed)
A and O x3. Report to RN. Tolerated MAC anesthesia well.Teeth unchanged after procedure. 

## 2022-06-01 ENCOUNTER — Telehealth: Payer: Self-pay | Admitting: *Deleted

## 2022-06-01 NOTE — Telephone Encounter (Signed)
No answer on follow up call. Voice mail not set up  

## 2022-06-16 ENCOUNTER — Encounter: Payer: Self-pay | Admitting: Gastroenterology

## 2023-02-14 ENCOUNTER — Other Ambulatory Visit: Payer: Self-pay | Admitting: Physician Assistant

## 2023-04-20 ENCOUNTER — Other Ambulatory Visit: Payer: Self-pay | Admitting: Pain Medicine

## 2023-04-20 DIAGNOSIS — M961 Postlaminectomy syndrome, not elsewhere classified: Secondary | ICD-10-CM

## 2023-04-21 ENCOUNTER — Ambulatory Visit
Admission: RE | Admit: 2023-04-21 | Discharge: 2023-04-21 | Disposition: A | Discharge status: Home / Self Care | Payer: 59 | Source: Ambulatory Visit | Attending: Pain Medicine | Admitting: Pain Medicine

## 2023-04-21 DIAGNOSIS — M961 Postlaminectomy syndrome, not elsewhere classified: Secondary | ICD-10-CM

## 2023-07-27 ENCOUNTER — Other Ambulatory Visit: Payer: Self-pay | Admitting: Orthopedic Surgery

## 2023-07-27 NOTE — Progress Notes (Signed)
Preop call completed.  Pt verbalizes understanding of 0845 arrival, no solid food after MN and clear liquids MN until 0800.

## 2023-07-28 ENCOUNTER — Ambulatory Visit (HOSPITAL_COMMUNITY): Payer: Worker's Compensation | Admitting: Certified Registered"

## 2023-07-28 ENCOUNTER — Other Ambulatory Visit: Payer: Self-pay

## 2023-07-28 ENCOUNTER — Ambulatory Visit (HOSPITAL_COMMUNITY)
Admission: RE | Admit: 2023-07-28 | Discharge: 2023-07-28 | Disposition: A | Discharge status: Home / Self Care | Payer: Worker's Compensation | Attending: Orthopedic Surgery | Admitting: Orthopedic Surgery

## 2023-07-28 ENCOUNTER — Encounter (HOSPITAL_COMMUNITY): Payer: Self-pay | Admitting: Orthopedic Surgery

## 2023-07-28 ENCOUNTER — Encounter (HOSPITAL_COMMUNITY)
Admission: RE | Disposition: A | Discharge status: Home / Self Care | Payer: Self-pay | Source: Home / Self Care | Attending: Orthopedic Surgery

## 2023-07-28 DIAGNOSIS — Z86711 Personal history of pulmonary embolism: Secondary | ICD-10-CM | POA: Diagnosis not present

## 2023-07-28 DIAGNOSIS — G8929 Other chronic pain: Secondary | ICD-10-CM | POA: Insufficient documentation

## 2023-07-28 DIAGNOSIS — K219 Gastro-esophageal reflux disease without esophagitis: Secondary | ICD-10-CM | POA: Diagnosis not present

## 2023-07-28 DIAGNOSIS — M19011 Primary osteoarthritis, right shoulder: Secondary | ICD-10-CM | POA: Diagnosis present

## 2023-07-28 DIAGNOSIS — I341 Nonrheumatic mitral (valve) prolapse: Secondary | ICD-10-CM | POA: Insufficient documentation

## 2023-07-28 DIAGNOSIS — I252 Old myocardial infarction: Secondary | ICD-10-CM | POA: Diagnosis not present

## 2023-07-28 DIAGNOSIS — G4733 Obstructive sleep apnea (adult) (pediatric): Secondary | ICD-10-CM | POA: Diagnosis not present

## 2023-07-28 DIAGNOSIS — M94211 Chondromalacia, right shoulder: Secondary | ICD-10-CM | POA: Insufficient documentation

## 2023-07-28 DIAGNOSIS — M75111 Incomplete rotator cuff tear or rupture of right shoulder, not specified as traumatic: Secondary | ICD-10-CM | POA: Insufficient documentation

## 2023-07-28 DIAGNOSIS — M549 Dorsalgia, unspecified: Secondary | ICD-10-CM | POA: Diagnosis not present

## 2023-07-28 HISTORY — PX: REVERSE SHOULDER ARTHROPLASTY: SHX5054

## 2023-07-28 SURGERY — ARTHROPLASTY, SHOULDER, TOTAL, REVERSE
Anesthesia: General | Site: Shoulder | Laterality: Right

## 2023-07-28 MED ORDER — SODIUM CHLORIDE 0.9 % IR SOLN
Status: DC | PRN
  Administered 2023-07-28: 1000 mL

## 2023-07-28 MED ORDER — EPHEDRINE 5 MG/ML INJ
INTRAVENOUS | Status: AC
  Filled 2023-07-28: qty 5

## 2023-07-28 MED ORDER — PHENYLEPHRINE HCL-NACL 20-0.9 MG/250ML-% IV SOLN
INTRAVENOUS | Status: DC | PRN
  Administered 2023-07-28: 25 ug/min via INTRAVENOUS

## 2023-07-28 MED ORDER — PROPOFOL 10 MG/ML IV BOLUS
INTRAVENOUS | Status: DC | PRN
  Administered 2023-07-28: 140 mg via INTRAVENOUS

## 2023-07-28 MED ORDER — FENTANYL CITRATE (PF) 100 MCG/2ML IJ SOLN
INTRAMUSCULAR | Status: AC
  Filled 2023-07-28: qty 2

## 2023-07-28 MED ORDER — LIDOCAINE 2% (20 MG/ML) 5 ML SYRINGE
INTRAMUSCULAR | Status: DC | PRN
  Administered 2023-07-28: 40 mg via INTRAVENOUS

## 2023-07-28 MED ORDER — SUGAMMADEX SODIUM 200 MG/2ML IV SOLN
INTRAVENOUS | Status: DC | PRN
  Administered 2023-07-28: 220 mg via INTRAVENOUS

## 2023-07-28 MED ORDER — ONDANSETRON HCL 4 MG/2ML IJ SOLN
INTRAMUSCULAR | Status: DC | PRN
  Administered 2023-07-28: 4 mg via INTRAVENOUS

## 2023-07-28 MED ORDER — WATER FOR IRRIGATION, STERILE IR SOLN
Status: DC | PRN
  Administered 2023-07-28: 1000 mL via SURGICAL_CAVITY

## 2023-07-28 MED ORDER — ACETAMINOPHEN 10 MG/ML IV SOLN
INTRAVENOUS | Status: AC
  Filled 2023-07-28: qty 100

## 2023-07-28 MED ORDER — OXYCODONE HCL 5 MG PO TABS: 5.0000 mg | ORAL_TABLET | ORAL | 0 refills | Status: DC | PRN

## 2023-07-28 MED ORDER — MEPERIDINE HCL 50 MG/ML IJ SOLN: 6.2500 mg | INTRAMUSCULAR | Status: DC | PRN

## 2023-07-28 MED ORDER — FENTANYL CITRATE (PF) 100 MCG/2ML IJ SOLN
INTRAMUSCULAR | Status: DC | PRN
  Administered 2023-07-28: 50 ug via INTRAVENOUS

## 2023-07-28 MED ORDER — PROPOFOL 10 MG/ML IV BOLUS
INTRAVENOUS | Status: AC
  Filled 2023-07-28: qty 20

## 2023-07-28 MED ORDER — HYDROMORPHONE HCL 1 MG/ML IJ SOLN
INTRAMUSCULAR | Status: AC
  Filled 2023-07-28: qty 1

## 2023-07-28 MED ORDER — FENTANYL CITRATE PF 50 MCG/ML IJ SOSY
25.0000 ug | PREFILLED_SYRINGE | INTRAMUSCULAR | Status: AC
  Administered 2023-07-28: 50 ug via INTRAVENOUS
  Filled 2023-07-28: qty 2

## 2023-07-28 MED ORDER — METHOCARBAMOL 750 MG PO TABS: 750.0000 mg | ORAL_TABLET | Freq: Three times a day (TID) | ORAL | 0 refills | Status: AC | PRN

## 2023-07-28 MED ORDER — DEXAMETHASONE SODIUM PHOSPHATE 10 MG/ML IJ SOLN
INTRAMUSCULAR | Status: DC | PRN
  Administered 2023-07-28: 4 mg via INTRAVENOUS

## 2023-07-28 MED ORDER — MIDAZOLAM HCL 2 MG/2ML IJ SOLN
0.5000 mg | INTRAMUSCULAR | Status: AC
  Administered 2023-07-28: 2 mg via INTRAVENOUS
  Filled 2023-07-28: qty 2

## 2023-07-28 MED ORDER — DROPERIDOL 2.5 MG/ML IJ SOLN
0.6250 mg | Freq: Once | INTRAMUSCULAR | Status: DC | PRN
Start: 2023-07-28 — End: 2023-07-28

## 2023-07-28 MED ORDER — ACETAMINOPHEN 325 MG PO TABS: 325.0000 mg | ORAL_TABLET | Freq: Once | ORAL | Status: DC | PRN

## 2023-07-28 MED ORDER — ACETAMINOPHEN 160 MG/5ML PO SOLN: 325.0000 mg | Freq: Once | ORAL | Status: DC | PRN

## 2023-07-28 MED ORDER — BUPIVACAINE LIPOSOME 1.3 % IJ SUSP
INTRAMUSCULAR | Status: DC | PRN
  Administered 2023-07-28: 10 mL via PERINEURAL

## 2023-07-28 MED ORDER — DEXAMETHASONE SODIUM PHOSPHATE 10 MG/ML IJ SOLN
INTRAMUSCULAR | Status: AC
  Filled 2023-07-28: qty 1

## 2023-07-28 MED ORDER — BUPIVACAINE HCL (PF) 0.5 % IJ SOLN
INTRAMUSCULAR | Status: DC | PRN
  Administered 2023-07-28: 12 mL via PERINEURAL

## 2023-07-28 MED ORDER — OXYCODONE HCL 5 MG PO TABS
5.0000 mg | ORAL_TABLET | Freq: Once | ORAL | Status: AC
  Administered 2023-07-28: 5 mg via ORAL

## 2023-07-28 MED ORDER — ROCURONIUM BROMIDE 10 MG/ML (PF) SYRINGE
PREFILLED_SYRINGE | INTRAVENOUS | Status: AC
  Filled 2023-07-28: qty 10

## 2023-07-28 MED ORDER — ACETAMINOPHEN 10 MG/ML IV SOLN
1000.0000 mg | Freq: Once | INTRAVENOUS | Status: DC | PRN
  Administered 2023-07-28: 1000 mg via INTRAVENOUS

## 2023-07-28 MED ORDER — CEFAZOLIN SODIUM-DEXTROSE 2-4 GM/100ML-% IV SOLN
2.0000 g | INTRAVENOUS | Status: AC
  Administered 2023-07-28: 2 g via INTRAVENOUS
  Filled 2023-07-28: qty 100

## 2023-07-28 MED ORDER — OXYCODONE HCL 5 MG PO TABS
ORAL_TABLET | ORAL | Status: AC
  Filled 2023-07-28: qty 1

## 2023-07-28 MED ORDER — LACTATED RINGERS IV SOLN: INTRAVENOUS | Status: DC

## 2023-07-28 MED ORDER — TRANEXAMIC ACID-NACL 1000-0.7 MG/100ML-% IV SOLN
1000.0000 mg | INTRAVENOUS | Status: AC
  Administered 2023-07-28: 1000 mg via INTRAVENOUS
  Filled 2023-07-28: qty 100

## 2023-07-28 MED ORDER — CHLORHEXIDINE GLUCONATE 0.12 % MT SOLN
15.0000 mL | Freq: Once | OROMUCOSAL | Status: AC
  Administered 2023-07-28: 15 mL via OROMUCOSAL

## 2023-07-28 MED ORDER — ROCURONIUM BROMIDE 10 MG/ML (PF) SYRINGE
PREFILLED_SYRINGE | INTRAVENOUS | Status: DC | PRN
  Administered 2023-07-28: 80 mg via INTRAVENOUS

## 2023-07-28 MED ORDER — ONDANSETRON HCL 4 MG/2ML IJ SOLN
INTRAMUSCULAR | Status: AC
  Filled 2023-07-28: qty 2

## 2023-07-28 MED ORDER — HYDROMORPHONE HCL 1 MG/ML IJ SOLN
0.2500 mg | INTRAMUSCULAR | Status: DC | PRN
  Administered 2023-07-28: 0.5 mg via INTRAVENOUS

## 2023-07-28 MED ORDER — HYDROMORPHONE HCL 1 MG/ML IJ SOLN
0.2500 mg | INTRAMUSCULAR | Status: DC | PRN
  Administered 2023-07-28 (×2): 0.25 mg via INTRAVENOUS

## 2023-07-28 MED ORDER — LACTATED RINGERS IV SOLN: INTRAVENOUS | Status: DC | PRN

## 2023-07-28 MED ORDER — EPHEDRINE SULFATE-NACL 50-0.9 MG/10ML-% IV SOSY
PREFILLED_SYRINGE | INTRAVENOUS | Status: DC | PRN
  Administered 2023-07-28 (×3): 5 mg via INTRAVENOUS

## 2023-07-28 SURGICAL SUPPLY — 68 items
BAG COUNTER SPONGE SURGICOUNT (BAG) IMPLANT
BAG ZIPLOCK 12X15 (MISCELLANEOUS) ×1 IMPLANT
BASEPLATE P2 COATD GLND 6.5X30 (Shoulder) IMPLANT
BIT DRILL 1.6MX128 (BIT) IMPLANT
BIT DRILL 2.5 DIA 127 CALI (BIT) IMPLANT
BIT DRILL 4 DIA CALIBRATED (BIT) IMPLANT
BLADE SAW SGTL 73X25 THK (BLADE) ×1 IMPLANT
BOOTIES KNEE HIGH SLOAN (MISCELLANEOUS) ×2 IMPLANT
COOLER ICEMAN CLASSIC (MISCELLANEOUS) IMPLANT
COVER BACK TABLE 60X90IN (DRAPES) ×1 IMPLANT
COVER SURGICAL LIGHT HANDLE (MISCELLANEOUS) ×1 IMPLANT
DRAPE INCISE IOBAN 66X45 STRL (DRAPES) ×1 IMPLANT
DRAPE POUCH INSTRU U-SHP 10X18 (DRAPES) ×1 IMPLANT
DRAPE SHEET LG 3/4 BI-LAMINATE (DRAPES) ×1 IMPLANT
DRAPE SURG 17X11 SM STRL (DRAPES) ×1 IMPLANT
DRAPE SURG ORHT 6 SPLT 77X108 (DRAPES) ×2 IMPLANT
DRAPE TOP 10253 STERILE (DRAPES) ×1 IMPLANT
DRAPE U-SHAPE 47X51 STRL (DRAPES) ×1 IMPLANT
DRSG AQUACEL AG ADV 3.5X 6 (GAUZE/BANDAGES/DRESSINGS) ×1 IMPLANT
DURAPREP 26ML APPLICATOR (WOUND CARE) ×2 IMPLANT
ELECT BLADE TIP CTD 4 INCH (ELECTRODE) ×1 IMPLANT
ELECT REM PT RETURN 15FT ADLT (MISCELLANEOUS) ×1 IMPLANT
FACESHIELD WRAPAROUND (MASK) ×1 IMPLANT
FACESHIELD WRAPAROUND OR TEAM (MASK) ×1 IMPLANT
GLOVE BIO SURGEON STRL SZ7.5 (GLOVE) ×1 IMPLANT
GLOVE BIOGEL PI IND STRL 6.5 (GLOVE) ×1 IMPLANT
GLOVE BIOGEL PI IND STRL 8 (GLOVE) ×1 IMPLANT
GLOVE SURG SS PI 6.5 STRL IVOR (GLOVE) ×1 IMPLANT
GOWN STRL REUS W/ TWL LRG LVL3 (GOWN DISPOSABLE) ×1 IMPLANT
GOWN STRL REUS W/ TWL XL LVL3 (GOWN DISPOSABLE) ×1 IMPLANT
HEAD GLENOID W/SCREW 32MM (Shoulder) IMPLANT
HOOD PEEL AWAY T7 (MISCELLANEOUS) ×3 IMPLANT
INSERT EPOLY STND HUMERUS 36MM (Shoulder) ×1 IMPLANT
INSERT EPOLYSTD HUMERUS 36MM (Shoulder) IMPLANT
KIT BASIN OR (CUSTOM PROCEDURE TRAY) ×1 IMPLANT
KIT TURNOVER KIT A (KITS) IMPLANT
MANIFOLD NEPTUNE II (INSTRUMENTS) ×1 IMPLANT
NDL TROCAR POINT SZ 2 1/2 (NEEDLE) IMPLANT
NEEDLE TROCAR POINT SZ 2 1/2 (NEEDLE) IMPLANT
NS IRRIG 1000ML POUR BTL (IV SOLUTION) ×1 IMPLANT
P2 COATDE GLNOID BSEPLT 6.5X30 (Shoulder) ×1 IMPLANT
PACK SHOULDER (CUSTOM PROCEDURE TRAY) ×1 IMPLANT
PAD COLD SHLDR WRAP-ON (PAD) IMPLANT
PROTECTOR NERVE ULNAR (MISCELLANEOUS) IMPLANT
RESTRAINT HEAD UNIVERSAL NS (MISCELLANEOUS) ×1 IMPLANT
RETRIEVER SUT HEWSON (MISCELLANEOUS) IMPLANT
SCREW BONE LOCKING RSP 5.0X14 (Screw) ×1 IMPLANT
SCREW BONE LOCKING RSP 5.0X30 (Screw) ×2 IMPLANT
SCREW BONE RSP LOCK 5X14 (Screw) IMPLANT
SCREW BONE RSP LOCK 5X30 (Screw) IMPLANT
SET HNDPC FAN SPRY TIP SCT (DISPOSABLE) ×1 IMPLANT
SLING ARM IMMOBILIZER LRG (SOFTGOODS) IMPLANT
SLING ARM IMMOBILIZER MED (SOFTGOODS) IMPLANT
STEM HUMERAL 12X48 STD SHORT (Shoulder) IMPLANT
STRIP CLOSURE SKIN 1/2X4 (GAUZE/BANDAGES/DRESSINGS) ×2 IMPLANT
SUCTION TUBE FRAZIER 10FR DISP (SUCTIONS) IMPLANT
SUPPORT WRAP ARM LG (MISCELLANEOUS) ×1 IMPLANT
SUT ETHIBOND 2 V 37 (SUTURE) IMPLANT
SUT FIBERWIRE #2 38 REV NDL BL (SUTURE)
SUT MNCRL AB 4-0 PS2 18 (SUTURE) ×1 IMPLANT
SUT VIC AB 2-0 CT1 TAPERPNT 27 (SUTURE) ×2 IMPLANT
SUTURE FIBERWR#2 38 REV NDL BL (SUTURE) IMPLANT
TAP SURG THRD DJ 6.5 (ORTHOPEDIC DISPOSABLE SUPPLIES) IMPLANT
TAPE LABRALWHITE 1.5X36 (TAPE) IMPLANT
TAPE SUT LABRALTAP WHT/BLK (SUTURE) IMPLANT
TOWEL OR 17X26 10 PK STRL BLUE (TOWEL DISPOSABLE) ×1 IMPLANT
TOWEL OR NON WOVEN STRL DISP B (DISPOSABLE) ×1 IMPLANT
WATER STERILE IRR 1000ML POUR (IV SOLUTION) ×1 IMPLANT

## 2023-07-28 NOTE — H&P (Signed)
Paul Griffin is an 57 y.o. male.   Chief Complaint: Right shoulder pain and dysfunction HPI: Status post injury at work and subsequent right shoulder surgery.  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  He had a debridement which ultimately did not lead to resolution of symptoms.  He has had continued mechanical symptoms as well as ongoing pain and dysfunction.  Ultimately indicated for reverse total shoulder arthroplasty to try and address both rotator cuff and joint issues.  Past Medical History:  Diagnosis Date   Abdominal distension    Anemia    Anxiety    panic attacks   Blood in stool    Chronic back pain    "neck to sacral" (01/01/2013)   Clotting disorder (HCC)    PE right    Constipation    DDD (degenerative disc disease), cervical    DDD (degenerative disc disease), lumbar    DDD (degenerative disc disease), lumbosacral    DDD (degenerative disc disease), thoracolumbar    Depression    Diarrhea    Difficulty urinating    Dyslipidemia    "good cholesterol isn't as high as dr would like" (01/01/2013)   Erectile dysfunction    organic   Family history of anesthesia complication    "Mom; PONV" (01/01/2013)   Family history of colon cancer    Generalized headaches    "monthly" (01/01/2013)   GERD (gastroesophageal reflux disease)    History of blood transfusion 1980's   "related to bleeding ulcers" (01/01/2013)   Hoarseness of voice    chronic   Hyperlipidemia    Lipoma of arm 1994   right   Mitral valve prolapse    "mild" (01/01/2013)   Nasal congestion    Obstructive sleep apnea    miald w aith RDI of 6.4 per hour   Osteoarthritis    "hips & hands" (01/01/2013)   Palpitations    Peptic ulcer 1994   severe requiring vagotomy and pyloroplasty   PONV (postoperative nausea and vomiting)    Pulmonary embolism on right (HCC) 01/01/2013   Rectal bleeding    Shortness of breath     "related to the PE today" (01/01/2013)   Trouble swallowing    Tubular adenoma of colon 08/2012   Ulcer    gastric 1988    Past Surgical History:  Procedure Laterality Date   COLONOSCOPY  07/08/2007   showed polyps, follow up in 2013   EXCISIONAL HEMORRHOIDECTOMY     "w/colonoscopies" (01/01/2013)   HERNIA REPAIR  12/25/2012   multi inc hernia   INCISIONAL HERNIA REPAIR N/A 12/25/2012   Procedure: LAPAROSCOPIC REPAIR MULITPLE INCISIONAL HERNIA WITH MESH;  Surgeon: Ernestene Mention, MD;  Location: [facility] OR;  Service: General;  Laterality: N/A;   INSERTION OF MESH N/A 12/25/2012   Procedure: INSERTION OF MESH;  Surgeon: Ernestene Mention, MD;  Location: [facility] OR;  Service: General;  Laterality: N/A;   LEFT HEART CATH AND CORONARY ANGIOGRAPHY N/A 02/11/2021   Procedure: LEFT HEART CATH AND CORONARY ANGIOGRAPHY;  Surgeon: Elder Negus, MD;  Location: [facility] INVASIVE CV LAB;  Service: Cardiovascular;  Laterality: N/A;   LEFT HEART CATHETERIZATION WITH CORONARY ANGIOGRAM N/A 10/15/2013   Procedure: LEFT HEART CATHETERIZATION WITH CORONARY ANGIOGRAM;  Surgeon: Pamella Pert, MD;  Location: [facility] CATH LAB;  Service: Cardiovascular;  Laterality: N/A;   LUMBAR FUSION     2020?  POSTERIOR CERVICAL FUSION/FORAMINOTOMY  09/06/2010   fusion   ROTATOR CUFF REPAIR Right 2023   THORACIC AORTOGRAM N/A 02/11/2021   Procedure: THORACIC AORTOGRAM;  Surgeon: Elder Negus, MD;  Location: [facility] INVASIVE CV LAB;  Service: Cardiovascular;  Laterality: N/A;   TONSILLECTOMY  09/06/2006   VAGOTOMY AND PYLOROPLASTY  09/06/1986   "partial; for ulcers" (01/01/2013)    Family History  Problem Relation Age of Onset   Colon cancer Mother    Lymphoma Mother        non- hodgkins   COPD Mother    Melanoma Mother    Colon polyps Mother    Kidney cancer Father    Heart disease Father    Colon polyps Father    Ovarian cancer Sister    Ulcerative colitis Sister    Colon polyps Sister    Breast cancer Sister     Colon polyps Sister    Cancer Sister        brain   Colon polyps Sister    Colon polyps Sister    Colon polyps Sister    Colon polyps Brother    Colon polyps Brother    Rectal cancer Neg Hx    Stomach cancer Neg Hx    Esophageal cancer Neg Hx    Social History:  reports that he has never smoked. He has never used smokeless tobacco. He reports current alcohol use. He reports that he does not use drugs.  Allergies:  Allergies  Allergen Reactions   Buprenorphine Hcl Other (See Comments)    hallucinations   Levitra [Vardenafil] Other (See Comments)    headaches   Morphine And Codeine Other (See Comments)    hallucinations    Medications Prior to Admission  Medication Sig Dispense Refill   celecoxib (CELEBREX) 200 MG capsule Take 200 mg by mouth in the morning and at bedtime.     diclofenac sodium (VOLTAREN) 1 % GEL Apply 2 g topically 4 (four) times daily as needed (pain).     gabapentin (NEURONTIN) 100 MG capsule Take 100 mg by mouth at bedtime.     HYDROcodone-acetaminophen (NORCO/VICODIN) 5-325 MG tablet Take 1 tablet by mouth 2 (two) times daily as needed for pain.     LORazepam (ATIVAN) 0.5 MG tablet Take 0.5 mg by mouth 2 (two) times daily as needed for anxiety.  3   methocarbamol (ROBAXIN) 750 MG tablet Take 750 mg by mouth every 8 (eight) hours as needed for muscle spasms.     omeprazole (PRILOSEC) 40 MG capsule TAKE 1 CAPSULE(40 MG) BY MOUTH DAILY BEFORE BREAKFAST 30 capsule 11   rosuvastatin (CRESTOR) 20 MG tablet Take 20 mg by mouth every evening.     sertraline (ZOLOFT) 100 MG tablet Take 100 mg by mouth in the morning.      No results found for this or any previous visit (from the past 48 hour(s)). No results found.  Review of Systems  All other systems reviewed and are negative.   Blood pressure 136/83, pulse 69, temperature 97.6 F (36.4 C), temperature source Oral, resp. rate 16, height 5\' 11"  (1.803 m), weight 106.1 kg, SpO2 98%. Physical  Exam Constitutional:      Appearance: He is well-developed.  HENT:     Head: Atraumatic.  Eyes:     Extraocular Movements: Extraocular movements intact.  Cardiovascular:     Pulses: Normal pulses.  Pulmonary:     Effort: Pulmonary effort is normal.  Musculoskeletal:     Comments: Right shoulder  pain with limited range of motion.  Skin:    General: Skin is warm and dry.  Neurological:     Mental Status: He is alert and oriented to person, place, and time.  Psychiatric:        Mood and Affect: Mood normal.      Assessment/Plan Right shoulder pain and dysfunction related to arthropathy and rotator cuff disease Plan right reverse total shoulder arthroplasty Risks / benefits of surgery discussed Consent on chart  NPO for OR Preop antibiotics   Glennon Hamilton, MD 07/28/2023, 10:41 AM

## 2023-07-28 NOTE — Transfer of Care (Signed)
Immediate Anesthesia Transfer of Care Note  Patient: Paul Griffin  Procedure(s) Performed: REVERSE SHOULDER ARTHROPLASTY (Right: Shoulder)  Patient Location: PACU  Anesthesia Type:General  Level of Consciousness: awake, alert , and patient cooperative  Airway & Oxygen Therapy: Patient Spontanous Breathing and Patient connected to face mask oxygen  Post-op Assessment: Report given to RN and Post -op Vital signs reviewed and stable  Post vital signs: Reviewed and stable  Last Vitals:  Vitals Value Taken Time  BP 129/85 07/28/23 1251  Temp    Pulse 77 07/28/23 1253  Resp 19 07/28/23 1253  SpO2 99 % 07/28/23 1253  Vitals shown include unfiled device data.  Last Pain:  Vitals:   07/28/23 0956  TempSrc:   PainSc: 4          Complications:  Encounter Notable Events  Notable Event Outcome Phase Comment  Difficult to intubate - expected  Intraprocedure Filed from anesthesia note documentation.

## 2023-07-28 NOTE — Anesthesia Postprocedure Evaluation (Signed)
Anesthesia Post Note  Patient: Paul Griffin  Procedure(s) Performed: REVERSE SHOULDER ARTHROPLASTY (Right: Shoulder)     Patient location during evaluation: PACU Anesthesia Type: General Level of consciousness: awake and alert Pain management: pain level controlled Vital Signs Assessment: post-procedure vital signs reviewed and stable Respiratory status: spontaneous breathing, nonlabored ventilation, respiratory function stable and patient connected to nasal cannula oxygen Cardiovascular status: blood pressure returned to baseline and stable Postop Assessment: no apparent nausea or vomiting Anesthetic complications: yes   Encounter Notable Events  Notable Event Outcome Phase Comment  Difficult to intubate - expected  Intraprocedure Filed from anesthesia note documentation.   No complications during procedure.   Last Vitals:  Vitals:   07/28/23 1400 07/28/23 1415  BP:  114/80  Pulse: 71 76  Resp: 10   Temp:  36.6 C  SpO2: 92% 95%    Last Pain:  Vitals:   07/28/23 1415  TempSrc:   PainSc: 1                  Shelton Silvas

## 2023-07-28 NOTE — Anesthesia Procedure Notes (Signed)
Anesthesia Regional Block: Interscalene brachial plexus block   Pre-Anesthetic Checklist: , timeout performed,  Correct Patient, Correct Site, Correct Laterality,  Correct Procedure, Correct Position, site marked,  Risks and benefits discussed,  Surgical consent,  Pre-op evaluation,  At surgeon's request and post-op pain management  Laterality: Right  Prep: chloraprep       Needles:  Injection technique: Single-shot  Needle Type: Echogenic Stimulator Needle     Needle Length: 9cm  Needle Gauge: 21     Additional Needles:   Procedures:,,,, ultrasound used (permanent image in chart),,    Narrative:  Start time: 07/28/2023 11:10 AM End time: 07/28/2023 11:15 AM Injection made incrementally with aspirations every 5 mL.  Performed by: Personally  Anesthesiologist: Shelton Silvas, MD  Additional Notes: Discussed risks and benefits of the nerve block in detail, including but not limited vascular injury, permanent nerve damage and infection.   Patient tolerated the procedure well. Local anesthetic introduced in an incremental fashion under minimal resistance after negative aspirations. No paresthesias were elicited. After completion of the procedure, no acute issues were identified and patient continued to be monitored by RN.

## 2023-07-28 NOTE — Anesthesia Preprocedure Evaluation (Addendum)
Anesthesia Evaluation  Patient identified by MRN, date of birth, ID band Patient awake    Reviewed: Allergy & Precautions, NPO status , Patient's Chart, lab work & pertinent test results  History of Anesthesia Complications (+) PONV and history of anesthetic complications  Airway Mallampati: III  TM Distance: >3 FB Neck ROM: Full    Dental  (+) Teeth Intact, Dental Advisory Given   Pulmonary sleep apnea    breath sounds clear to auscultation       Cardiovascular + Past MI  + Valvular Problems/Murmurs MVP  Rhythm:Regular Rate:Normal     Neuro/Psych  Headaches PSYCHIATRIC DISORDERS Anxiety Depression       GI/Hepatic Neg liver ROS, PUD,GERD  Medicated,,  Endo/Other  negative endocrine ROS    Renal/GU negative Renal ROS     Musculoskeletal  (+) Arthritis ,    Abdominal   Peds  Hematology  (+) Blood dyscrasia, anemia   Anesthesia Other Findings   Reproductive/Obstetrics                             Anesthesia Physical Anesthesia Plan  ASA: 2  Anesthesia Plan: General   Post-op Pain Management: Tylenol PO (pre-op)* and Toradol IV (intra-op)*   Induction: Intravenous  PONV Risk Score and Plan: 4 or greater and Ondansetron, Dexamethasone, Midazolam and Scopolamine patch - Pre-op  Airway Management Planned: Oral ETT  Additional Equipment: None  Intra-op Plan:   Post-operative Plan: Extubation in OR  Informed Consent: I have reviewed the patients History and Physical, chart, labs and discussed the procedure including the risks, benefits and alternatives for the proposed anesthesia with the patient or authorized representative who has indicated his/her understanding and acceptance.     Dental advisory given  Plan Discussed with: CRNA  Anesthesia Plan Comments:        Anesthesia Quick Evaluation

## 2023-07-28 NOTE — Evaluation (Signed)
Occupational Therapy Evaluation Patient Details Name: Paul Griffin MRN: [id number] DOB: September 16, 1965 Today's Date: 07/28/2023   History of Present Illness 57 yo M s/p TSA - Reverse.  PMH includes arthritis   Clinical Impression   Patient admitted for the surgery above.  PTA he lives with his spouse, who is an Charity fundraiser, and provide the needed assist.  Patient R arm is still under the effects of the block.  All precautions and education reviewed with good understanding.  All handouts logged under education and reviewed.  Patient and souse with no further questions and ready for discharge.  Recommend follow up as prescribed by MD>           If plan is discharge home, recommend the following: A little help with bathing/dressing/bathroom;Assist for transportation    Functional Status Assessment  Patient has had a recent decline in their functional status and demonstrates the ability to make significant improvements in function in a reasonable and predictable amount of time.  Equipment Recommendations  None recommended by OT    Recommendations for Other Services       Precautions / Restrictions Precautions Precautions: Shoulder Type of Shoulder Precautions: No A/PROM to R shoulder Shoulder Interventions: Shoulder sling/immobilizer;Off for dressing/bathing/exercises Precaution Booklet Issued: Yes (comment) Required Braces or Orthoses: Sling Restrictions Weight Bearing Restrictions: Yes RUE Weight Bearing: Non weight bearing      Mobility Bed Mobility                    Transfers Overall transfer level: Needs assistance   Transfers: Sit to/from Stand Sit to Stand: Supervision                  Balance Overall balance assessment: Mild deficits observed, not formally tested                                         ADL either performed or assessed with clinical judgement   ADL                   Upper Body Dressing : Moderate  assistance   Lower Body Dressing: Minimal assistance   Toilet Transfer: Supervision/safety                   Vision Baseline Vision/History: 0 No visual deficits Patient Visual Report: No change from baseline       Perception Perception: Within Functional Limits       Praxis Praxis: WFL       Pertinent Vitals/Pain Pain Assessment Pain Assessment: No/denies pain     Extremity/Trunk Assessment Upper Extremity Assessment Upper Extremity Assessment: Right hand dominant;RUE deficits/detail RUE Deficits / Details: block in place RUE Sensation: decreased light touch;decreased proprioception RUE Coordination: decreased fine motor;decreased gross motor   Lower Extremity Assessment Lower Extremity Assessment: Overall WFL for tasks assessed   Cervical / Trunk Assessment Cervical / Trunk Assessment: Normal;Back Surgery;Neck Surgery   Communication Communication Communication: No apparent difficulties   Cognition Arousal: Alert Behavior During Therapy: WFL for tasks assessed/performed Overall Cognitive Status: Within Functional Limits for tasks assessed                                       General Comments   VSS    Exercises     Shoulder  Instructions      Home Living Family/patient expects to be discharged to:: Private residence Living Arrangements: Spouse/significant other Available Help at Discharge: Family;Available 24 hours/day Type of Home: House Home Access: Stairs to enter     Home Layout: One level     Bathroom Shower/Tub: Walk-in shower;Tub/shower unit   Bathroom Toilet: Standard Bathroom Accessibility: Yes   Home Equipment: Tub bench          Prior Functioning/Environment Prior Level of Function : Independent/Modified Independent;Driving;Working/employed                        OT Problem List: Decreased range of motion      OT Treatment/Interventions:      OT Goals(Current goals can be found in the care  plan section) Acute Rehab OT Goals Patient Stated Goal: Return home OT Goal Formulation: With patient Time For Goal Achievement: 08/01/23 Potential to Achieve Goals: Good  OT Frequency:      Co-evaluation              AM-PAC OT "6 Clicks" Daily Activity     Outcome Measure Help from another person eating meals?: None Help from another person taking care of personal grooming?: None Help from another person toileting, which includes using toliet, bedpan, or urinal?: None Help from another person bathing (including washing, rinsing, drying)?: A Little Help from another person to put on and taking off regular upper body clothing?: A Lot Help from another person to put on and taking off regular lower body clothing?: A Little 6 Click Score: 20   End of Session Nurse Communication: Mobility status  Activity Tolerance: Patient tolerated treatment well Patient left: in chair;with call bell/phone within reach  OT Visit Diagnosis: Pain Pain - Right/Left: Right Pain - part of body: Shoulder                Time: 1430-1453 OT Time Calculation (min): 23 min Charges:  OT General Charges $OT Visit: 1 Visit OT Evaluation $OT Eval Moderate Complexity: 1 Mod OT Treatments $Self Care/Home Management : 8-22 mins  07/28/2023  RP, OTR/L  Acute Rehabilitation Services  Office:  [phone]   Suzanna Obey 07/28/2023, 3:00 PM

## 2023-07-28 NOTE — Discharge Instructions (Addendum)
Discharge Instructions after Reverse Total Shoulder Arthroplasty   . A sling has been provided for you. You are to wear this at all times (except for bathing and dressing), until your first post operative visit with Dr. Chandler. Please also wear while sleeping at night. While you bath and dress, let the arm/elbow extend straight down to stretch your elbow. Wiggle your fingers and pump your first while your in the sling to prevent hand swelling. . Use ice on the shoulder intermittently over the first 48 hours after surgery. Continue to use ice or and ice machine as needed after 48 hours for pain control/swelling.  . Pain medicine has been prescribed for you.  . Use your medicine liberally over the first 48 hours, and then you can begin to taper your use. You may take Extra Strength Tylenol or Tylenol only in place of the pain pills. DO NOT take ANY nonsteroidal anti-inflammatory pain medications: Advil, Motrin, Ibuprofen, Aleve, Naproxen or Naprosyn.  . Take one aspirin a day for 2 weeks after surgery, unless you have an aspirin sensitivity/allergy or asthma.  . Leave your dressing on until your first follow up visit.  You may shower with the dressing.  Hold your arm as if you still have your sling on while you shower. . Simply allow the water to wash over the site and then pat dry. Make sure your axilla (armpit) is completely dry after showering.    Please call [phone] during normal business hours or [phone] after hours for any problems. Including the following:  - excessive redness of the incisions - drainage for more than 4 days - fever of more than 101.5 F  *Please note that pain medications will not be refilled after hours or on weekends.    Dental Antibiotics:  In most cases prophylactic antibiotics for Dental procdeures after total joint surgery are not necessary.  Exceptions are as follows:  1. History of prior total joint infection  2. Severely immunocompromised  (Organ Transplant, cancer chemotherapy, Rheumatoid biologic meds such as Humera)  3. Poorly controlled diabetes (A1C &gt; 8.0, blood glucose over 200)  If you have one of these conditions, contact your surgeon for an antibiotic prescription, prior to your dental procedure. 

## 2023-07-28 NOTE — Anesthesia Procedure Notes (Signed)
Procedure Name: Intubation Date/Time: 07/28/2023 11:37 AM  Performed by: Sindy Guadeloupe, CRNAPre-anesthesia Checklist: Patient identified, Emergency Drugs available, Suction available, Patient being monitored and Timeout performed Patient Re-evaluated:Patient Re-evaluated prior to induction Oxygen Delivery Method: Circle system utilized Preoxygenation: Pre-oxygenation with 100% oxygen Induction Type: IV induction Ventilation: Two handed mask ventilation required and Oral airway inserted - appropriate to patient size Laryngoscope Size: Glidescope and 4 Grade View: Grade II Tube type: Oral Tube size: 7.5 mm Number of attempts: 1 Airway Equipment and Method: Stylet Placement Confirmation: ETT inserted through vocal cords under direct vision, positive ETCO2 and breath sounds checked- equal and bilateral Secured at: 23 cm Tube secured with: Tape Dental Injury: Teeth and Oropharynx as per pre-operative assessment  Difficulty Due To: Difficulty was anticipated Comments: Rigid neck, Glidescope x 1 attempt, grade 2 view, atraumatic intubation, +/= BBS, +EtCO2.

## 2023-07-28 NOTE — Op Note (Signed)
Procedure(s): REVERSE SHOULDER ARTHROPLASTY Procedure Note  Paul Griffin male 57 y.o. 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  Ultimately indicated for reverse total shoulder arthroplasty to try and decrease pain and improve function.     Surgeon: Glennon Hamilton   Assistants: Fredia Sorrow PA-C Amber was present and scrubbed throughout the procedure and was essential in positioning, retraction, exposure, and closure)  Anesthesia: General endotracheal anesthesia with preoperative interscalene block given by the attending anesthesiologist     Procedure Detail  REVERSE SHOULDER ARTHROPLASTY   Estimated Blood Loss:  200 mL         Drains: none  Blood Given: none          Specimens: none        Complications:  (1) Difficult to intubate - expected  Comments: Filed from anesthesia note documentation.         Disposition: PACU - hemodynamically stable.         Condition: stable      OPERATIVE FINDINGS:  A DJO Altivate pressfit reverse total shoulder arthroplasty was placed with a  size 12 stem, a 36 standard glenosphere, and a standard-mm poly insert. The base plate  fixation was excellent.  PROCEDURE: The patient was identified in the preoperative holding area  where I personally marked the operative site after verifying site, side,  and procedure with the patient. An interscalene block given by  the attending anesthesiologist in the holding area and the patient was taken back to the operating room where all extremities were  carefully padded in position  after general anesthesia was induced. She  was placed in a beach-chair position and the operative upper extremity was  prepped and draped in a standard sterile fashion. An approximately 10-  cm incision was made from the tip of the coracoid process to the center  point of the humerus at the level of the axilla. Dissection was carried  down through subcutaneous tissues to the level of the cephalic vein  which was taken laterally with the deltoid. The pectoralis major was  retracted medially. The subdeltoid space was developed and the lateral  edge of the conjoined tendon was identified. The undersurface of  conjoined tendon was palpated and the musculocutaneous nerve was not in  the field. Retractor was placed underneath the conjoined and second  retractor was placed lateral into the deltoid. The circumflex humeral  artery and vessels were identified and clamped and coagulated. The  biceps tendon was tenodesed to the upper border of the pectoralis major.  The subscapularis was taken down as a peel with the underlying capsule.  The  joint was then gently externally rotated while the capsule was released  from the humeral neck around to just beyond the 6 o'clock position. At  this point, the joint was dislocated and the humeral head was presented  into the wound. The excessive osteophyte formation was removed with a  large rongeur.  The cutting guide was used to make the appropriate  head cut and the head was saved for potentially bone grafting.  The glenoid was exposed with the arm in an  abducted extended position. The anterior and posterior labrum were  completely excised and the capsule was released circumferentially to  allow for exposure of the glenoid for preparation. The 2.5 mm drill was  placed using the guide in 5-10 inferior angulation and the tap was then advanced in the same hole. Small and large reamers were then used. The tap was then removed and the Metaglene was then screwed in  with excellent purchase.  The peripheral guide was then used to drilled measured and filled peripheral locking screws. The size 36 standard glenosphere was then impacted on the [facility] taper and the central screw was placed. The humerus was then again exposed and the diaphyseal reamers were used followed by the metaphyseal reamers. The final broach was left in place in the proximal trial was placed. The joint was reduced and with this implant it was felt that soft tissue tensioning was appropriate with excellent stability and excellent range of motion. Therefore, final humeral stem was placed press-fit.  And then the trial polyethylene inserts were tested again and the above implant was felt to be the most appropriate for final insertion. The joint was reduced taken through full range of motion and felt to be stable. Soft tissue tension was appropriate.  The joint was then copiously irrigated with pulse  lavage and the wound was then closed. The subscapularis was repaired with 2 labral tapes passed through bone tunnels and around the implant.  Skin was closed with 2-0 Vicryl in a deep dermal layer and 4-0  Monocryl for skin closure. Steri-Strips were applied. Sterile  dressings were then applied as well as a sling. The patient was allowed  to awaken from general anesthesia, transferred to stretcher, and taken  to recovery room in stable condition.   POSTOPERATIVE PLAN: The patient will be observed in the recovery room and if his pain is well-controlled with regional anesthesia and he is hemodynamically stable he can be discharged home today with family.

## 2023-07-29 ENCOUNTER — Encounter (HOSPITAL_COMMUNITY): Payer: Self-pay | Admitting: Orthopedic Surgery

## 2023-09-07 DIAGNOSIS — C61 Malignant neoplasm of prostate: Secondary | ICD-10-CM

## 2023-09-07 HISTORY — DX: Malignant neoplasm of prostate: C61

## 2023-12-07 ENCOUNTER — Other Ambulatory Visit: Payer: Self-pay | Admitting: Urology

## 2024-01-13 NOTE — Patient Instructions (Signed)
 SURGICAL WAITING ROOM VISITATION  Patients having surgery or a procedure may have no more than 2 support people in the waiting area - these visitors may rotate.    Children under the age of 18 must have an adult with them who is not the patient.   Visitors with respiratory illnesses are discouraged from visiting and should remain at home.  If the patient needs to stay at the hospital during part of their recovery, the visitor guidelines for inpatient rooms apply. Pre-op nurse will coordinate an appropriate time for 1 support person to accompany patient in pre-op.  This support person may not rotate.    Please refer to the [facility] website for the visitor guidelines for Inpatients (after your surgery is over and you are in a regular room).       Your procedure is scheduled on:  01/25/2024    Report to [facility] Main Entrance    Report to admitting at AM   Call this number if you have problems the morning of surgery [phone]   Clear liquid diet the day before surgery.              Nothing after midntie.              Magnesium  Citrate- 8 ounces at 12 noon day before surgery.                Water  Non-Citrus Juices (without pulp, NO RED-Apple, White grape, White cranberry) Black Coffee (NO MILK/CREAM OR CREAMERS, sugar ok)  Clear Tea (NO MILK/CREAM OR CREAMERS, sugar ok) regular and decaf                             Plain Jell-O (NO RED)                                           Fruit ices (not with fruit pulp, NO RED)                                     Popsicles (NO RED)                                                               Sports drinks like Gatorade (NO RED)                            If you have questions, please contact your surgeon's office.   FOLLOW BOWEL PREP AND ANY ADDITIONAL PRE OP INSTRUCTIONS YOU RECEIVED FROM YOUR SURGEON'S OFFICE!!!     Oral Hygiene is also important to reduce your risk of infection.                                     Remember - BRUSH YOUR TEETH THE MORNING OF SURGERY WITH YOUR REGULAR TOOTHPASTE  DENTURES WILL BE REMOVED PRIOR TO SURGERY PLEASE DO NOT APPLY "Poly grip" OR ADHESIVES!!!   Do NOT  smoke after Midnight   Stop all vitamins and herbal supplements 7 days before surgery.   Take these medicines the morning of surgery with A SIP OF WATER :  ativan  if needed, omeprazole , zoloft    DO NOT TAKE ANY ORAL DIABETIC MEDICATIONS DAY OF YOUR SURGERY  Bring CPAP mask and tubing day of surgery.                              You may not have any metal on your body including hair pins, jewelry, and body piercing             Do not wear make-up, lotions, powders, perfumes/cologne, or deodorant  Do not wear nail polish including gel and S&S, artificial/acrylic nails, or any other type of covering on natural nails including finger and toenails. If you have artificial nails, gel coating, etc. that needs to be removed by a nail salon please have this removed prior to surgery or surgery may need to be canceled/ delayed if the surgeon/ anesthesia feels like they are unable to be safely monitored.   Do not shave  48 hours prior to surgery.               Men may shave face and neck.   Do not bring valuables to the hospital. [facility] IS NOT             RESPONSIBLE   FOR VALUABLES.   Contacts, glasses, dentures or bridgework may not be worn into surgery.   Bring small overnight bag day of surgery.   DO NOT BRING YOUR HOME MEDICATIONS TO THE HOSPITAL. PHARMACY WILL DISPENSE MEDICATIONS LISTED ON YOUR MEDICATION LIST TO YOU DURING YOUR ADMISSION IN THE HOSPITAL!    Patients discharged on the day of surgery will not be allowed to drive home.  Someone NEEDS to stay with you for the first 24 hours after anesthesia.   Special Instructions: Bring a copy of your healthcare power of attorney and living will documents the day of surgery if you haven't scanned them before.              Please read over the following  fact sheets you were given: IF YOU HAVE QUESTIONS ABOUT YOUR PRE-OP INSTRUCTIONS PLEASE CALL [phone]   If you received a COVID test during your pre-op visit  it is requested that you wear a mask when out in public, stay away from anyone that may not be feeling well and notify your surgeon if you develop symptoms. If you test positive for Covid or have been in contact with anyone that has tested positive in the last 10 days please notify you surgeon.    [facility] - Preparing for Surgery Before surgery, you can play an important role.  Because skin is not sterile, your skin needs to be as free of germs as possible.  You can reduce the number of germs on your skin by washing with CHG (chlorahexidine gluconate) soap before surgery.  CHG is an antiseptic cleaner which kills germs and bonds with the skin to continue killing germs even after washing. Please DO NOT use if you have an allergy to CHG or antibacterial soaps.  If your skin becomes reddened/irritated stop using the CHG and inform your nurse when you arrive at Short Stay. Do not shave (including legs and underarms) for at least 48 hours prior to the first CHG shower.  You may shave your face/neck. Please  follow these instructions carefully:  1.  Shower with CHG Soap the night before surgery and the  morning of Surgery.  2.  If you choose to wash your hair, wash your hair first as usual with your  normal  shampoo.  3.  After you shampoo, rinse your hair and body thoroughly to remove the  shampoo.                           4.  Use CHG as you would any other liquid soap.  You can apply chg directly  to the skin and wash                       Gently with a scrungie or clean washcloth.  5.  Apply the CHG Soap to your body ONLY FROM THE NECK DOWN.   Do not use on face/ open                           Wound or open sores. Avoid contact with eyes, ears mouth and genitals (private parts).                       Wash face,  Genitals (private parts)  with your normal soap.             6.  Wash thoroughly, paying special attention to the area where your surgery  will be performed.  7.  Thoroughly rinse your body with warm water  from the neck down.  8.  DO NOT shower/wash with your normal soap after using and rinsing off  the CHG Soap.                9.  Pat yourself dry with a clean towel.            10.  Wear clean pajamas.            11.  Place clean sheets on your bed the night of your first shower and do not  sleep with pets. Day of Surgery : Do not apply any lotions/deodorants the morning of surgery.  Please wear clean clothes to the hospital/surgery center.  FAILURE TO FOLLOW THESE INSTRUCTIONS MAY RESULT IN THE CANCELLATION OF YOUR SURGERY PATIENT SIGNATURE_________________________________  NURSE SIGNATURE__________________________________  ________________________________________________________________________

## 2024-01-13 NOTE — Progress Notes (Signed)
 Anesthesia Review:  PCP: Cardiologist :  PPM/ ICD: Device Orders: Rep Notified:  Chest x-ray : CT Chest- 12/15/23  EKG :07/28/23  Echo : 2022  Stress test: Cardiac Cath :  2022   Activity level:  Sleep Study/ CPAP : Fasting Blood Sugar :      / Checks Blood Sugar -- times a day:    Blood Thinner/ Instructions /Last Dose: ASA / Instructions/ Last Dose :

## 2024-01-17 ENCOUNTER — Other Ambulatory Visit: Payer: Self-pay

## 2024-01-17 ENCOUNTER — Encounter (HOSPITAL_COMMUNITY): Payer: Self-pay

## 2024-01-17 ENCOUNTER — Encounter (HOSPITAL_COMMUNITY)
Admission: RE | Admit: 2024-01-17 | Discharge: 2024-01-17 | Disposition: A | Discharge status: Home / Self Care | Source: Ambulatory Visit | Attending: Urology | Admitting: Urology

## 2024-01-17 VITALS — BP 147/83 | HR 76 | Temp 98.0°F | Resp 16 | Ht 69.5 in | Wt 243.0 lb

## 2024-01-17 DIAGNOSIS — Z01812 Encounter for preprocedural laboratory examination: Secondary | ICD-10-CM | POA: Diagnosis present

## 2024-01-17 DIAGNOSIS — Z86711 Personal history of pulmonary embolism: Secondary | ICD-10-CM | POA: Diagnosis not present

## 2024-01-17 DIAGNOSIS — G4733 Obstructive sleep apnea (adult) (pediatric): Secondary | ICD-10-CM | POA: Diagnosis not present

## 2024-01-17 DIAGNOSIS — G8929 Other chronic pain: Secondary | ICD-10-CM | POA: Diagnosis not present

## 2024-01-17 DIAGNOSIS — K219 Gastro-esophageal reflux disease without esophagitis: Secondary | ICD-10-CM | POA: Insufficient documentation

## 2024-01-17 DIAGNOSIS — F32A Depression, unspecified: Secondary | ICD-10-CM | POA: Insufficient documentation

## 2024-01-17 DIAGNOSIS — F1120 Opioid dependence, uncomplicated: Secondary | ICD-10-CM | POA: Insufficient documentation

## 2024-01-17 DIAGNOSIS — Z01818 Encounter for other preprocedural examination: Secondary | ICD-10-CM

## 2024-01-17 DIAGNOSIS — C61 Malignant neoplasm of prostate: Secondary | ICD-10-CM | POA: Insufficient documentation

## 2024-01-17 HISTORY — DX: Malignant (primary) neoplasm, unspecified: C80.1

## 2024-01-17 LAB — CBC
HCT: 45.7 % (ref 39.0–52.0)
Hemoglobin: 14.9 g/dL (ref 13.0–17.0)
MCH: 29.4 pg (ref 26.0–34.0)
MCHC: 32.6 g/dL (ref 30.0–36.0)
MCV: 90.3 fL (ref 80.0–100.0)
Platelets: 218 10*3/uL (ref 150–400)
RBC: 5.06 MIL/uL (ref 4.22–5.81)
RDW: 13.6 % (ref 11.5–15.5)
WBC: 9.9 10*3/uL (ref 4.0–10.5)
nRBC: 0 % (ref 0.0–0.2)

## 2024-01-17 LAB — BASIC METABOLIC PANEL WITH GFR
Anion gap: 9 (ref 5–15)
BUN: 22 mg/dL — ABNORMAL HIGH (ref 6–20)
CO2: 22 mmol/L (ref 22–32)
Calcium: 9 mg/dL (ref 8.9–10.3)
Chloride: 108 mmol/L (ref 98–111)
Creatinine, Ser: 1.1 mg/dL (ref 0.61–1.24)
GFR, Estimated: >60 mL/min (ref >60)
Glucose, Bld: 132 mg/dL — ABNORMAL HIGH (ref 70–99)
Potassium: 3.9 mmol/L (ref 3.5–5.1)
Sodium: 139 mmol/L (ref 135–145)

## 2024-01-18 ENCOUNTER — Encounter (HOSPITAL_COMMUNITY): Payer: Self-pay

## 2024-01-18 NOTE — Anesthesia Preprocedure Evaluation (Addendum)
 Anesthesia Evaluation  Patient identified by MRN, date of birth, ID band Patient awake    Reviewed: Allergy & Precautions, H&P , NPO status , Patient's Chart, lab work & pertinent test results  History of Anesthesia Complications (+) PONV and history of anesthetic complications  Airway Mallampati: II   Neck ROM: full    Dental   Pulmonary sleep apnea    breath sounds clear to auscultation       Cardiovascular  Rhythm:regular Rate:Normal  Pericarditis 2022.  PE in 2014 s/p surgery.   Neuro/Psych  Headaches PSYCHIATRIC DISORDERS Anxiety Depression       GI/Hepatic PUD,GERD  ,,  Endo/Other    Class 3 obesity  Renal/GU      Musculoskeletal  (+) Arthritis ,    Abdominal   Peds  Hematology   Anesthesia Other Findings   Reproductive/Obstetrics                             Anesthesia Physical Anesthesia Plan  ASA: 2  Anesthesia Plan: General   Post-op Pain Management:    Induction: Intravenous  PONV Risk Score and Plan: 3 and Ondansetron , Dexamethasone , Midazolam  and Treatment may vary due to age or medical condition  Airway Management Planned: Oral ETT  Additional Equipment:   Intra-op Plan:   Post-operative Plan: Extubation in OR  Informed Consent: I have reviewed the patients History and Physical, chart, labs and discussed the procedure including the risks, benefits and alternatives for the proposed anesthesia with the patient or authorized representative who has indicated his/her understanding and acceptance.     Dental advisory given  Plan Discussed with: CRNA, Anesthesiologist and Surgeon  Anesthesia Plan Comments: (See PAT note from 5/13)        Anesthesia Quick Evaluation

## 2024-01-18 NOTE — Progress Notes (Signed)
 Case: [id number] Date/Time: 01/25/24 0815   Procedures:      PROSTATECTOMY, RADICAL, ROBOT-ASSISTED, LAPAROSCOPIC     LYMPHADENECTOMY, PELVIS, ROBOT-ASSISTED   Anesthesia type: General   Diagnosis: Prostate cancer (HCC) [C61]   Pre-op diagnosis: PROSTATE CANCER   Location[facility] ROOM 03 / [facility]   Surgeons: Melody Spurling., MD       DISCUSSION: Paul Griffin is a 58 year old male who presents to PAT prior to surgery above.  Past medical history significant for history of pericarditis, history of PE (2014, provoked after surgery), mild OSA (no CPAP use) GERD, PUD, chronic back pain with narcotic dependence, arthritis, depression  Prior anesthesia complications include PONV, difficult intubation per CRNA note during total shoulder surgery on 07/28/2023: "Rigid neck, Glidescope x 1 attempt, grade 2 view, atraumatic intubation, +/= BBS, +EtCO2."  Patient presented to the ED in 2022 with concerns for STEMI.  He went to the Cath Lab which showed normal coronaries.  Postprocedure it was felt that presentation was most consistent with acute idiopathic pericarditis. ESR and CRP were elevated.  Echocardiogram showed moderate pericardial effusion, which subsequently reduced in size, without signs of tamponade.  Patient's pain improved with ibuprofen  and colchicine .  Patient last seen in clinic on 06/29/2021. Echocardiogram 06/2021 noted improved LVEF from 45% to 55-60% and grade 1 diastolic dysfunction is now normal.  He was advised to follow-up as needed  VS: BP (!) 147/83   Pulse 76   Temp 36.7 C (Oral)   Resp 16   Ht 5' 9.5" (1.765 m)   Wt 110.2 kg   SpO2 98%   BMI 35.37 kg/m   PROVIDERS: Bertha Broad, MD   LABS: Labs reviewed: Acceptable for surgery. (all labs ordered are listed, but only abnormal results are displayed)  Labs Reviewed  BASIC METABOLIC PANEL WITH GFR - Abnormal; Notable for the following components:      Result Value   Glucose, Bld 132 (*)    BUN 22 (*)     All other components within normal limits  CBC  TYPE AND SCREEN     IMAGES:   EKG:   CV:  Echocardiogram 06/15/2021:  Normal LV systolic function with visual EF 55-60%. Left ventricle cavity is normal in size. Normal global wall motion. Normal diastolic filling pattern, normal LAP. Trace aortic regurgitation. Trace tricuspid  regurgitation. No evidence of pulmonary hypertension. Mild pulmonic regurgitation. Compared to study 03/05/2021 LVEF improved from 40-45% to 55-60%, G1DD is now normal, otherwise no significant change.  Left heart cath 02/11/2021:  Normal coronaries Normal LVEDP and LVEDP No thoracic aorta dissection Past Medical History:  Diagnosis Date   Abdominal distension    Anemia    Anxiety    panic attacks   Blood in stool    Cancer (HCC)    prostate cancer   Chronic back pain    "neck to sacral" (01/01/2013)   Clotting disorder (HCC)    PE right    Constipation    DDD (degenerative disc disease), cervical    DDD (degenerative disc disease), lumbar    DDD (degenerative disc disease), lumbosacral    DDD (degenerative disc disease), thoracolumbar    Depression    Diarrhea    Difficulty urinating    Dyslipidemia    "good cholesterol isn't as high as dr would like" (01/01/2013)   Erectile dysfunction    organic   Family history of anesthesia complication    "Mom; PONV" (01/01/2013)   Family history of colon cancer  Generalized headaches    "monthly" (01/01/2013)   GERD (gastroesophageal reflux disease)    History of blood transfusion 1980's   "related to bleeding ulcers" (01/01/2013)   Hoarseness of voice    chronic   Hyperlipidemia    Lipoma of arm 1994   right   Mitral valve prolapse    "mild" (01/01/2013)   Nasal congestion    Obstructive sleep apnea    miald w aith RDI of 6.4 per hour   Osteoarthritis    "hips & hands" (01/01/2013)   Palpitations    Peptic ulcer 1994   severe requiring vagotomy and pyloroplasty   PONV  (postoperative nausea and vomiting)    Pulmonary embolism on right (HCC) 01/01/2013   Rectal bleeding    Trouble swallowing    Tubular adenoma of colon 08/2012   Ulcer    gastric 1988    Past Surgical History:  Procedure Laterality Date   COLONOSCOPY  07/08/2007   showed polyps, follow up in 2013   eight shoulder replacement      EXCISIONAL HEMORRHOIDECTOMY     "w/colonoscopies" (01/01/2013)   HERNIA REPAIR  12/25/2012   multi inc hernia   INCISIONAL HERNIA REPAIR N/A 12/25/2012   Procedure: LAPAROSCOPIC REPAIR MULITPLE INCISIONAL HERNIA WITH MESH;  Surgeon: Levert Ready, MD;  Location: [facility] OR;  Service: General;  Laterality: N/A;   INSERTION OF MESH N/A 12/25/2012   Procedure: INSERTION OF MESH;  Surgeon: Levert Ready, MD;  Location: [facility] OR;  Service: General;  Laterality: N/A;   LEFT HEART CATH AND CORONARY ANGIOGRAPHY N/A 02/11/2021   Procedure: LEFT HEART CATH AND CORONARY ANGIOGRAPHY;  Surgeon: Cody Das, MD;  Location: [facility] INVASIVE CV LAB;  Service: Cardiovascular;  Laterality: N/A;   LEFT HEART CATHETERIZATION WITH CORONARY ANGIOGRAM N/A 10/15/2013   Procedure: LEFT HEART CATHETERIZATION WITH CORONARY ANGIOGRAM;  Surgeon: Jessica Morn, MD;  Location: [facility] CATH LAB;  Service: Cardiovascular;  Laterality: N/A;   LUMBAR FUSION     2020?   POSTERIOR CERVICAL FUSION/FORAMINOTOMY  09/06/2010   fusion   REVERSE SHOULDER ARTHROPLASTY Right 07/28/2023   Procedure: REVERSE SHOULDER ARTHROPLASTY;  Surgeon: Sammye Cristal, MD;  Location: [facility];  Service: Orthopedics;  Laterality: Right;   ROTATOR CUFF REPAIR Right 2023   THORACIC AORTOGRAM N/A 02/11/2021   Procedure: THORACIC AORTOGRAM;  Surgeon: Cody Das, MD;  Location: [facility] INVASIVE CV LAB;  Service: Cardiovascular;  Laterality: N/A;   TONSILLECTOMY  09/06/2006   VAGOTOMY AND PYLOROPLASTY  09/06/1986   "partial; for ulcers" (01/01/2013)    MEDICATIONS:  celecoxib  (CELEBREX ) 200 MG capsule    gabapentin (NEURONTIN) 100 MG capsule   HYDROcodone -acetaminophen  (NORCO/VICODIN) 5-325 MG tablet   LORazepam  (ATIVAN ) 0.5 MG tablet   methocarbamol  (ROBAXIN ) 750 MG tablet   omeprazole  (PRILOSEC) 40 MG capsule   rosuvastatin (CRESTOR) 20 MG tablet   senna (SENOKOT) 8.6 MG TABS tablet   sertraline  (ZOLOFT ) 100 MG tablet   No current facility-administered medications for this encounter.   Antoinette Kirschner [facility] Surgical Short Stay/Anesthesiology [facility] [place] Phone [phone] 01/18/2024 11:03 AM

## 2024-01-25 ENCOUNTER — Encounter (HOSPITAL_COMMUNITY): Payer: Self-pay | Admitting: Urology

## 2024-01-25 ENCOUNTER — Other Ambulatory Visit: Payer: Self-pay

## 2024-01-25 ENCOUNTER — Ambulatory Visit (HOSPITAL_COMMUNITY): Payer: Self-pay | Admitting: Medical

## 2024-01-25 ENCOUNTER — Encounter (HOSPITAL_COMMUNITY)
Admission: RE | Disposition: A | Discharge status: Home / Self Care | Payer: Self-pay | Source: Home / Self Care | Attending: Urology

## 2024-01-25 ENCOUNTER — Observation Stay (HOSPITAL_COMMUNITY)
Admission: RE | Admit: 2024-01-25 | Discharge: 2024-01-26 | Disposition: A | Discharge status: Home / Self Care | Attending: Urology | Admitting: Urology

## 2024-01-25 ENCOUNTER — Ambulatory Visit (HOSPITAL_BASED_OUTPATIENT_CLINIC_OR_DEPARTMENT_OTHER): Admitting: Anesthesiology

## 2024-01-25 DIAGNOSIS — C61 Malignant neoplasm of prostate: Secondary | ICD-10-CM

## 2024-01-25 DIAGNOSIS — Z01818 Encounter for other preprocedural examination: Secondary | ICD-10-CM

## 2024-01-25 DIAGNOSIS — Z9079 Acquired absence of other genital organ(s): Principal | ICD-10-CM

## 2024-01-25 DIAGNOSIS — Z79899 Other long term (current) drug therapy: Secondary | ICD-10-CM | POA: Diagnosis not present

## 2024-01-25 DIAGNOSIS — Z96611 Presence of right artificial shoulder joint: Secondary | ICD-10-CM | POA: Insufficient documentation

## 2024-01-25 HISTORY — PX: ROBOT ASSISTED LAPAROSCOPIC RADICAL PROSTATECTOMY: SHX5141

## 2024-01-25 LAB — HEMOGLOBIN AND HEMATOCRIT, BLOOD
HCT: 43.9 % (ref 39.0–52.0)
Hemoglobin: 14.3 g/dL (ref 13.0–17.0)

## 2024-01-25 LAB — BASIC METABOLIC PANEL WITH GFR
Anion gap: 9 (ref 5–15)
BUN: 18 mg/dL (ref 6–20)
CO2: 24 mmol/L (ref 22–32)
Calcium: 8.8 mg/dL — ABNORMAL LOW (ref 8.9–10.3)
Chloride: 105 mmol/L (ref 98–111)
Creatinine, Ser: 1.06 mg/dL (ref 0.61–1.24)
GFR, Estimated: >60 mL/min (ref >60)
Glucose, Bld: 144 mg/dL — ABNORMAL HIGH (ref 70–99)
Potassium: 4.3 mmol/L (ref 3.5–5.1)
Sodium: 138 mmol/L (ref 135–145)

## 2024-01-25 LAB — TYPE AND SCREEN
ABO/RH(D): O POS
Antibody Screen: NEGATIVE

## 2024-01-25 SURGERY — PROSTATECTOMY, RADICAL, ROBOT-ASSISTED, LAPAROSCOPIC
Anesthesia: General

## 2024-01-25 MED ORDER — EPHEDRINE 5 MG/ML INJ
INTRAVENOUS | Status: AC
  Filled 2024-01-25: qty 5

## 2024-01-25 MED ORDER — MIDAZOLAM HCL 2 MG/2ML IJ SOLN: 1.0000 mg | Freq: Once | INTRAMUSCULAR | Status: AC

## 2024-01-25 MED ORDER — SODIUM CHLORIDE 0.9% FLUSH: 3.0000 mL | INTRAVENOUS | Status: DC | PRN

## 2024-01-25 MED ORDER — ONDANSETRON HCL 4 MG/2ML IJ SOLN
INTRAMUSCULAR | Status: DC | PRN
  Administered 2024-01-25: 4 mg via INTRAVENOUS

## 2024-01-25 MED ORDER — FENTANYL CITRATE (PF) 100 MCG/2ML IJ SOLN
INTRAMUSCULAR | Status: AC
  Filled 2024-01-25: qty 2

## 2024-01-25 MED ORDER — PANTOPRAZOLE SODIUM 40 MG PO TBEC
80.0000 mg | DELAYED_RELEASE_TABLET | Freq: Every day | ORAL | Status: DC
  Administered 2024-01-26: 80 mg via ORAL
  Filled 2024-01-25 (×2): qty 2

## 2024-01-25 MED ORDER — FENTANYL CITRATE (PF) 100 MCG/2ML IJ SOLN
INTRAMUSCULAR | Status: DC | PRN
  Administered 2024-01-25: 100 ug via INTRAVENOUS
  Administered 2024-01-25: 50 ug via INTRAVENOUS

## 2024-01-25 MED ORDER — OXYCODONE HCL 5 MG/5ML PO SOLN: 5.0000 mg | Freq: Once | ORAL | Status: AC | PRN

## 2024-01-25 MED ORDER — BUPIVACAINE LIPOSOME 1.3 % IJ SUSP
INTRAMUSCULAR | Status: AC
  Filled 2024-01-25: qty 20

## 2024-01-25 MED ORDER — PHENYLEPHRINE 80 MCG/ML (10ML) SYRINGE FOR IV PUSH (FOR BLOOD PRESSURE SUPPORT)
PREFILLED_SYRINGE | INTRAVENOUS | Status: AC
  Filled 2024-01-25: qty 10

## 2024-01-25 MED ORDER — CEPHALEXIN 500 MG PO CAPS: 500.0000 mg | ORAL_CAPSULE | Freq: Two times a day (BID) | ORAL | 0 refills | Status: DC

## 2024-01-25 MED ORDER — SODIUM CHLORIDE 0.9% FLUSH
3.0000 mL | Freq: Two times a day (BID) | INTRAVENOUS | Status: DC
  Administered 2024-01-25 – 2024-01-26 (×3): 3 mL via INTRAVENOUS

## 2024-01-25 MED ORDER — LIDOCAINE HCL (CARDIAC) PF 100 MG/5ML IV SOSY
PREFILLED_SYRINGE | INTRAVENOUS | Status: DC | PRN
  Administered 2024-01-25: 60 mg via INTRAVENOUS

## 2024-01-25 MED ORDER — ORAL CARE MOUTH RINSE: 15.0000 mL | Freq: Once | OROMUCOSAL | Status: AC

## 2024-01-25 MED ORDER — PROPOFOL 10 MG/ML IV BOLUS
INTRAVENOUS | Status: DC | PRN
Start: 2024-01-25 — End: 2024-01-25
  Administered 2024-01-25: 200 mg via INTRAVENOUS

## 2024-01-25 MED ORDER — MIDAZOLAM HCL 2 MG/2ML IJ SOLN
INTRAMUSCULAR | Status: AC
  Administered 2024-01-25: 1 mg via INTRAVENOUS
  Filled 2024-01-25: qty 2

## 2024-01-25 MED ORDER — ONDANSETRON HCL 4 MG/2ML IJ SOLN: 4.0000 mg | INTRAMUSCULAR | Status: DC | PRN

## 2024-01-25 MED ORDER — ACETAMINOPHEN 500 MG PO TABS
1000.0000 mg | ORAL_TABLET | Freq: Four times a day (QID) | ORAL | Status: AC
  Administered 2024-01-25 – 2024-01-26 (×3): 1000 mg via ORAL
  Filled 2024-01-25 (×4): qty 2

## 2024-01-25 MED ORDER — SODIUM CHLORIDE 0.9 % IV BOLUS
1000.0000 mL | Freq: Once | INTRAVENOUS | Status: AC
  Administered 2024-01-25: 1000 mL via INTRAVENOUS

## 2024-01-25 MED ORDER — GABAPENTIN 100 MG PO CAPS
100.0000 mg | ORAL_CAPSULE | Freq: Every day | ORAL | Status: DC
  Administered 2024-01-25: 100 mg via ORAL
  Filled 2024-01-25: qty 1

## 2024-01-25 MED ORDER — SENNOSIDES-DOCUSATE SODIUM 8.6-50 MG PO TABS: 1.0000 | ORAL_TABLET | Freq: Two times a day (BID) | ORAL | 0 refills | Status: DC

## 2024-01-25 MED ORDER — ROCURONIUM BROMIDE 10 MG/ML (PF) SYRINGE
PREFILLED_SYRINGE | INTRAVENOUS | Status: DC | PRN
  Administered 2024-01-25: 20 mg via INTRAVENOUS
  Administered 2024-01-25: 50 mg via INTRAVENOUS
  Administered 2024-01-25: 30 mg via INTRAVENOUS
  Administered 2024-01-25: 20 mg via INTRAVENOUS

## 2024-01-25 MED ORDER — SODIUM CHLORIDE 0.9 % IV SOLN: INTRAVENOUS | Status: DC

## 2024-01-25 MED ORDER — STERILE WATER FOR IRRIGATION IR SOLN
Status: DC | PRN
  Administered 2024-01-25: 1000 mL

## 2024-01-25 MED ORDER — MIDAZOLAM HCL 2 MG/2ML IJ SOLN
INTRAMUSCULAR | Status: AC
  Filled 2024-01-25: qty 2

## 2024-01-25 MED ORDER — LIDOCAINE HCL (PF) 2 % IJ SOLN
INTRAMUSCULAR | Status: AC
Start: 2024-01-25 — End: ?
  Filled 2024-01-25: qty 5

## 2024-01-25 MED ORDER — EPHEDRINE SULFATE-NACL 50-0.9 MG/10ML-% IV SOSY
PREFILLED_SYRINGE | INTRAVENOUS | Status: DC | PRN
  Administered 2024-01-25: 10 mg via INTRAVENOUS

## 2024-01-25 MED ORDER — BUPIVACAINE LIPOSOME 1.3 % IJ SUSP
INTRAMUSCULAR | Status: DC | PRN
  Administered 2024-01-25: 20 mL

## 2024-01-25 MED ORDER — SODIUM CHLORIDE (PF) 0.9 % IJ SOLN
INTRAMUSCULAR | Status: AC
  Filled 2024-01-25: qty 20

## 2024-01-25 MED ORDER — SERTRALINE HCL 100 MG PO TABS
100.0000 mg | ORAL_TABLET | Freq: Every day | ORAL | Status: DC
  Administered 2024-01-26: 100 mg via ORAL
  Filled 2024-01-25: qty 1

## 2024-01-25 MED ORDER — OXYCODONE HCL 5 MG PO TABS
5.0000 mg | ORAL_TABLET | Freq: Once | ORAL | Status: AC | PRN
  Administered 2024-01-25: 5 mg via ORAL

## 2024-01-25 MED ORDER — ONDANSETRON HCL 4 MG/2ML IJ SOLN
INTRAMUSCULAR | Status: AC
  Filled 2024-01-25: qty 2

## 2024-01-25 MED ORDER — OXYCODONE-ACETAMINOPHEN 5-325 MG PO TABS
1.0000 | ORAL_TABLET | ORAL | 0 refills | Status: DC | PRN
Start: 2024-01-25 — End: 2024-01-26

## 2024-01-25 MED ORDER — KETOROLAC TROMETHAMINE 15 MG/ML IJ SOLN
15.0000 mg | Freq: Four times a day (QID) | INTRAMUSCULAR | Status: DC
  Administered 2024-01-25 – 2024-01-26 (×4): 15 mg via INTRAVENOUS
  Filled 2024-01-25 (×4): qty 1

## 2024-01-25 MED ORDER — MIDAZOLAM HCL 2 MG/2ML IJ SOLN
INTRAMUSCULAR | Status: DC | PRN
  Administered 2024-01-25: 2 mg via INTRAVENOUS

## 2024-01-25 MED ORDER — ROCURONIUM BROMIDE 10 MG/ML (PF) SYRINGE
PREFILLED_SYRINGE | INTRAVENOUS | Status: AC
  Filled 2024-01-25: qty 10

## 2024-01-25 MED ORDER — SODIUM CHLORIDE (PF) 0.9 % IJ SOLN
INTRAMUSCULAR | Status: DC | PRN
  Administered 2024-01-25: 20 mL

## 2024-01-25 MED ORDER — FENTANYL CITRATE PF 50 MCG/ML IJ SOSY: 50.0000 ug | PREFILLED_SYRINGE | Freq: Once | INTRAMUSCULAR | Status: AC

## 2024-01-25 MED ORDER — OXYCODONE HCL 5 MG PO TABS
ORAL_TABLET | ORAL | Status: AC
  Filled 2024-01-25: qty 1

## 2024-01-25 MED ORDER — DOCUSATE SODIUM 100 MG PO CAPS
100.0000 mg | ORAL_CAPSULE | Freq: Two times a day (BID) | ORAL | Status: DC
Start: 2024-01-25 — End: 2024-01-26
  Administered 2024-01-25 – 2024-01-26 (×2): 100 mg via ORAL
  Filled 2024-01-25 (×2): qty 1

## 2024-01-25 MED ORDER — TRIPLE ANTIBIOTIC 3.5-400-5000 EX OINT: 1.0000 | TOPICAL_OINTMENT | Freq: Three times a day (TID) | CUTANEOUS | Status: DC | PRN

## 2024-01-25 MED ORDER — OXYBUTYNIN CHLORIDE 5 MG PO TABS: 5.0000 mg | ORAL_TABLET | Freq: Three times a day (TID) | ORAL | Status: DC | PRN

## 2024-01-25 MED ORDER — DEXAMETHASONE SODIUM PHOSPHATE 10 MG/ML IJ SOLN
INTRAMUSCULAR | Status: AC
  Filled 2024-01-25: qty 1

## 2024-01-25 MED ORDER — METHOCARBAMOL 500 MG PO TABS: 750.0000 mg | ORAL_TABLET | Freq: Three times a day (TID) | ORAL | Status: DC | PRN

## 2024-01-25 MED ORDER — DEXAMETHASONE SODIUM PHOSPHATE 10 MG/ML IJ SOLN
INTRAMUSCULAR | Status: DC | PRN
Start: 2024-01-25 — End: 2024-01-25
  Administered 2024-01-25: 4 mg via INTRAVENOUS

## 2024-01-25 MED ORDER — LACTATED RINGERS IV SOLN: INTRAVENOUS | Status: AC

## 2024-01-25 MED ORDER — OXYCODONE HCL 5 MG PO TABS
5.0000 mg | ORAL_TABLET | ORAL | Status: DC | PRN
  Administered 2024-01-25 (×2): 5 mg via ORAL
  Filled 2024-01-25 (×2): qty 1

## 2024-01-25 MED ORDER — SODIUM CHLORIDE 0.9 % IV SOLN: 250.0000 mL | INTRAVENOUS | Status: DC | PRN

## 2024-01-25 MED ORDER — FENTANYL CITRATE PF 50 MCG/ML IJ SOSY
PREFILLED_SYRINGE | INTRAMUSCULAR | Status: AC
  Administered 2024-01-25: 50 ug via INTRAVENOUS
  Filled 2024-01-25: qty 1

## 2024-01-25 MED ORDER — MAGNESIUM CITRATE PO SOLN: 1.0000 | Freq: Once | ORAL | Status: DC

## 2024-01-25 MED ORDER — HYDROMORPHONE HCL 1 MG/ML IJ SOLN
0.5000 mg | INTRAMUSCULAR | Status: DC | PRN
  Administered 2024-01-25: 1 mg via INTRAVENOUS
  Filled 2024-01-25: qty 1

## 2024-01-25 MED ORDER — CEFAZOLIN SODIUM-DEXTROSE 2-4 GM/100ML-% IV SOLN
2.0000 g | INTRAVENOUS | Status: AC
  Administered 2024-01-25: 2 g via INTRAVENOUS
  Filled 2024-01-25: qty 100

## 2024-01-25 MED ORDER — STERILE WATER FOR INJECTION IJ SOLN
INTRAMUSCULAR | Status: DC | PRN
  Administered 2024-01-25: 4 mL

## 2024-01-25 MED ORDER — FENTANYL CITRATE PF 50 MCG/ML IJ SOSY: 25.0000 ug | PREFILLED_SYRINGE | INTRAMUSCULAR | Status: DC | PRN

## 2024-01-25 MED ORDER — CHLORHEXIDINE GLUCONATE 0.12 % MT SOLN
15.0000 mL | Freq: Once | OROMUCOSAL | Status: AC
  Administered 2024-01-25: 15 mL via OROMUCOSAL

## 2024-01-25 MED ORDER — PHENYLEPHRINE 80 MCG/ML (10ML) SYRINGE FOR IV PUSH (FOR BLOOD PRESSURE SUPPORT)
PREFILLED_SYRINGE | INTRAVENOUS | Status: DC | PRN
  Administered 2024-01-25 (×2): 80 ug via INTRAVENOUS

## 2024-01-25 MED ORDER — SUGAMMADEX SODIUM 200 MG/2ML IV SOLN
INTRAVENOUS | Status: DC | PRN
  Administered 2024-01-25: 220 mg via INTRAVENOUS

## 2024-01-25 MED ORDER — PROPOFOL 10 MG/ML IV BOLUS
INTRAVENOUS | Status: AC
  Filled 2024-01-25: qty 20

## 2024-01-25 MED ORDER — ONDANSETRON HCL 4 MG/2ML IJ SOLN: 4.0000 mg | Freq: Four times a day (QID) | INTRAMUSCULAR | Status: DC | PRN

## 2024-01-25 MED ORDER — LORAZEPAM 0.5 MG PO TABS: 0.5000 mg | ORAL_TABLET | Freq: Two times a day (BID) | ORAL | Status: DC | PRN

## 2024-01-25 SURGICAL SUPPLY — 61 items
APPLICATOR COTTON TIP 6 STRL (MISCELLANEOUS) ×1 IMPLANT
APPLICATOR COTTON TIP 6IN STRL (MISCELLANEOUS) ×1 IMPLANT
BAG COUNTER SPONGE SURGICOUNT (BAG) IMPLANT
CATH FOLEY 2WAY SLVR 18FR 30CC (CATHETERS) ×1 IMPLANT
CATH TIEMANN FOLEY 18FR 5CC (CATHETERS) ×1 IMPLANT
CHLORAPREP W/TINT 26 (MISCELLANEOUS) ×1 IMPLANT
CLIP LIGATING HEM O LOK PURPLE (MISCELLANEOUS) ×2 IMPLANT
CNTNR URN SCR LID CUP LEK RST (MISCELLANEOUS) ×1 IMPLANT
COVER SURGICAL LIGHT HANDLE (MISCELLANEOUS) ×1 IMPLANT
COVER TIP SHEARS 8 DVNC (MISCELLANEOUS) ×1 IMPLANT
CUTTER ECHEON FLEX ENDO 45 340 (ENDOMECHANICALS) ×1 IMPLANT
DERMABOND ADVANCED .7 DNX12 (GAUZE/BANDAGES/DRESSINGS) ×1 IMPLANT
DRAPE ARM DVNC X/XI (DISPOSABLE) ×4 IMPLANT
DRAPE COLUMN DVNC XI (DISPOSABLE) ×1 IMPLANT
DRAPE SURG IRRIG POUCH 19X23 (DRAPES) ×1 IMPLANT
DRIVER NDL LRG 8 DVNC XI (INSTRUMENTS) ×2 IMPLANT
DRIVER NDLE LRG 8 DVNC XI (INSTRUMENTS) ×2 IMPLANT
DRSG TEGADERM 4X4.75 (GAUZE/BANDAGES/DRESSINGS) ×1 IMPLANT
ELECT PENCIL ROCKER SW 15FT (MISCELLANEOUS) ×1 IMPLANT
ELECT REM PT RETURN 15FT ADLT (MISCELLANEOUS) ×1 IMPLANT
FORCEPS BPLR LNG DVNC XI (INSTRUMENTS) ×1 IMPLANT
FORCEPS PROGRASP DVNC XI (FORCEP) ×1 IMPLANT
GAUZE 4X4 16PLY ~~LOC~~+RFID DBL (SPONGE) IMPLANT
GAUZE SPONGE 2X2 8PLY STRL LF (GAUZE/BANDAGES/DRESSINGS) IMPLANT
GAUZE SPONGE 4X4 12PLY STRL (GAUZE/BANDAGES/DRESSINGS) ×1 IMPLANT
GLOVE BIO SURGEON STRL SZ 6.5 (GLOVE) ×1 IMPLANT
GLOVE BIOGEL PI IND STRL 7.5 (GLOVE) ×1 IMPLANT
GLOVE SURG LX STRL 7.5 STRW (GLOVE) ×2 IMPLANT
GOWN STRL REUS W/ TWL XL LVL3 (GOWN DISPOSABLE) ×2 IMPLANT
GOWN STRL SURGICAL XL XLNG (GOWN DISPOSABLE) ×1 IMPLANT
HOLDER FOLEY CATH W/STRAP (MISCELLANEOUS) ×1 IMPLANT
IRRIGATION SUCT STRKRFLW 2 WTP (MISCELLANEOUS) ×1 IMPLANT
IV LACTATED RINGERS 1000ML (IV SOLUTION) ×1 IMPLANT
KIT PROCEDURE DVNC SI (MISCELLANEOUS) ×1 IMPLANT
KIT TURNOVER KIT A (KITS) IMPLANT
NDL INSUFFLATION 14GA 120MM (NEEDLE) ×1 IMPLANT
NDL SPNL 22GX7 QUINCKE BK (NEEDLE) ×1 IMPLANT
NEEDLE INSUFFLATION 14GA 120MM (NEEDLE) ×1 IMPLANT
NEEDLE SPNL 22GX7 QUINCKE BK (NEEDLE) ×1 IMPLANT
PACK ROBOT UROLOGY CUSTOM (CUSTOM PROCEDURE TRAY) ×1 IMPLANT
PAD POSITIONING PINK XL (MISCELLANEOUS) ×1 IMPLANT
PLUG CATH AND CAP STRL 200 (CATHETERS) ×1 IMPLANT
PORT ACCESS TROCAR AIRSEAL 12 (TROCAR) ×1 IMPLANT
RELOAD STAPLE 45 4.1 GRN THCK (STAPLE) ×1 IMPLANT
SCISSORS MNPLR CVD DVNC XI (INSTRUMENTS) ×1 IMPLANT
SEAL UNIV 5-12 XI (MISCELLANEOUS) ×4 IMPLANT
SET TRI-LUMEN FLTR TB AIRSEAL (TUBING) ×1 IMPLANT
SOL PREP POV-IOD 4OZ 10% (MISCELLANEOUS) ×1 IMPLANT
SOLUTION ELECTROSURG ANTI STCK (MISCELLANEOUS) ×1 IMPLANT
SPIKE FLUID TRANSFER (MISCELLANEOUS) ×1 IMPLANT
SPONGE T-LAP 4X18 ~~LOC~~+RFID (SPONGE) ×1 IMPLANT
STAPLE RELOAD 45 GRN (STAPLE) ×1 IMPLANT
SUT ETHILON 3 0 PS 1 (SUTURE) ×1 IMPLANT
SUT MNCRL AB 4-0 PS2 18 (SUTURE) ×2 IMPLANT
SUT NOVA NAB GS-21 0 18 T12 DT (SUTURE) IMPLANT
SUT PDS AB 1 CT1 27 (SUTURE) ×2 IMPLANT
SUT VIC AB 2-0 SH 27X BRD (SUTURE) ×1 IMPLANT
SUT VICRYL 0 UR6 27IN ABS (SUTURE) ×1 IMPLANT
SUTURE VLOC BRB 180 ABS3/0GR12 (SUTURE) IMPLANT
SYR 27GX1/2 1ML LL SAFETY (SYRINGE) ×1 IMPLANT
WATER STERILE IRR 1000ML POUR (IV SOLUTION) ×1 IMPLANT

## 2024-01-25 NOTE — H&P (Signed)
 Paul Griffin is an 58 y.o. male.    Chief Complaint: Pre-Op Prostatectomy  HPI:   1 - Moderate Risk Prostate Cancer - 3 cores up to 20% mix grade 1 and 2 cancer on BX 10/2023 on eval PSA 5. TRUS 42 gm. no medain. Some modest baseling LUTS bother.   PMH sig for DVT / PE (after joint replacement, not on blood thinners), pyloroplasty / mesh ventral hernia repair (large supra-umbilical scar). Wife Moira Andrews is Charity fundraiser with [employer]. WOrks at [employer] at [employer]. His PCP is Zollie Hipp MD>   Today " Dorla Gartner" is seen  to proceed with prostatectomy / node dissection. No interval fevers. Hgb 14.9, Cr 1.1, most recent UCX negative. Rectn CT with large mesh sheet to slightly below umbilicus but preserved fat planes. Nervous today, but ready.   Past Medical History:  Diagnosis Date   Abdominal distension    Anemia    Anxiety    panic attacks   Blood in stool    Cancer [facility])    prostate cancer   Chronic back pain    "neck to sacral" (01/01/2013)   Clotting disorder (HCC)    PE right    Constipation    DDD (degenerative disc disease), cervical    DDD (degenerative disc disease), lumbar    DDD (degenerative disc disease), lumbosacral    DDD (degenerative disc disease), thoracolumbar    Depression    Diarrhea    Difficulty urinating    Dyslipidemia    "good cholesterol isn't as high as dr would like" (01/01/2013)   Erectile dysfunction    organic   Family history of anesthesia complication    "Mom; PONV" (01/01/2013)   Family history of colon cancer    Generalized headaches    "monthly" (01/01/2013)   GERD (gastroesophageal reflux disease)    History of blood transfusion 1980's   "related to bleeding ulcers" (01/01/2013)   Hoarseness of voice    chronic   Hyperlipidemia    Lipoma of arm 1994   right   Mitral valve prolapse    "mild" (01/01/2013)   Nasal congestion    Obstructive sleep apnea    miald w aith RDI of 6.4 per hour   Osteoarthritis    "hips & hands" (01/01/2013)    Palpitations    Peptic ulcer 1994   severe requiring vagotomy and pyloroplasty   PONV (postoperative nausea and vomiting)    Pulmonary embolism on right (HCC) 01/01/2013   Rectal bleeding    Trouble swallowing    Tubular adenoma of colon 08/2012   Ulcer    gastric 1988    Past Surgical History:  Procedure Laterality Date   COLONOSCOPY  07/08/2007   showed polyps, follow up in 2013   eight shoulder replacement      EXCISIONAL HEMORRHOIDECTOMY     "w/colonoscopies" (01/01/2013)   HERNIA REPAIR  12/25/2012   multi inc hernia   INCISIONAL HERNIA REPAIR N/A 12/25/2012   Procedure: LAPAROSCOPIC REPAIR MULITPLE INCISIONAL HERNIA WITH MESH;  Surgeon: Levert Ready, MD;  Location: [facility] OR;  Service: General;  Laterality: N/A;   INSERTION OF MESH N/A 12/25/2012   Procedure: INSERTION OF MESH;  Surgeon: Levert Ready, MD;  Location: [facility] OR;  Service: General;  Laterality: N/A;   LEFT HEART CATH AND CORONARY ANGIOGRAPHY N/A 02/11/2021   Procedure: LEFT HEART CATH AND CORONARY ANGIOGRAPHY;  Surgeon: Cody Das, MD;  Location: [facility] INVASIVE CV LAB;  Service: Cardiovascular;  Laterality: N/A;  LEFT HEART CATHETERIZATION WITH CORONARY ANGIOGRAM N/A 10/15/2013   Procedure: LEFT HEART CATHETERIZATION WITH CORONARY ANGIOGRAM;  Surgeon: Jessica Morn, MD;  Location: [facility] CATH LAB;  Service: Cardiovascular;  Laterality: N/A;   LUMBAR FUSION     2020?   POSTERIOR CERVICAL FUSION/FORAMINOTOMY  09/06/2010   fusion   REVERSE SHOULDER ARTHROPLASTY Right 07/28/2023   Procedure: REVERSE SHOULDER ARTHROPLASTY;  Surgeon: Sammye Cristal, MD;  Location: [facility] ORS;  Service: Orthopedics;  Laterality: Right;   ROTATOR CUFF REPAIR Right 2023   THORACIC AORTOGRAM N/A 02/11/2021   Procedure: THORACIC AORTOGRAM;  Surgeon: Cody Das, MD;  Location: [facility] INVASIVE CV LAB;  Service: Cardiovascular;  Laterality: N/A;   TONSILLECTOMY  09/06/2006   VAGOTOMY AND PYLOROPLASTY  09/06/1986   "partial;  for ulcers" (01/01/2013)    Family History  Problem Relation Age of Onset   Colon cancer Mother    Lymphoma Mother        non- hodgkins   COPD Mother    Melanoma Mother    Colon polyps Mother    Kidney cancer Father    Heart disease Father    Colon polyps Father    Ovarian cancer Sister    Ulcerative colitis Sister    Colon polyps Sister    Breast cancer Sister    Colon polyps Sister    Cancer Sister        brain   Colon polyps Sister    Colon polyps Sister    Colon polyps Sister    Colon polyps Brother    Colon polyps Brother    Rectal cancer Neg Hx    Stomach cancer Neg Hx    Esophageal cancer Neg Hx    Social History:  reports that he has never smoked. He has never used smokeless tobacco. He reports current alcohol use. He reports that he does not use drugs.  Allergies:  Allergies  Allergen Reactions   Buprenorphine Hcl Other (See Comments)    hallucinations   Levitra [Vardenafil] Other (See Comments)    headaches   Morphine And Codeine Other (See Comments)    hallucinations    Medications Prior to Admission  Medication Sig Dispense Refill   celecoxib  (CELEBREX ) 200 MG capsule Take 200 mg by mouth 2 (two) times daily.     gabapentin (NEURONTIN) 100 MG capsule Take 100 mg by mouth at bedtime.     HYDROcodone -acetaminophen  (NORCO/VICODIN) 5-325 MG tablet Take 1 tablet by mouth 2 (two) times daily as needed for moderate pain (pain score 4-6).     LORazepam  (ATIVAN ) 0.5 MG tablet Take 0.5 mg by mouth 2 (two) times daily as needed for anxiety.  3   methocarbamol  (ROBAXIN ) 750 MG tablet Take 1 tablet (750 mg total) by mouth every 8 (eight) hours as needed for muscle spasms. 30 tablet 0   omeprazole  (PRILOSEC) 40 MG capsule TAKE 1 CAPSULE(40 MG) BY MOUTH DAILY BEFORE BREAKFAST 30 capsule 11   rosuvastatin (CRESTOR) 20 MG tablet Take 20 mg by mouth every evening.     senna (SENOKOT) 8.6 MG TABS tablet Take 1 tablet by mouth daily.     sertraline  (ZOLOFT ) 100 MG tablet  Take 100 mg by mouth in the morning.      No results found for this or any previous visit (from the past 48 hours). No results found.  Review of Systems  Constitutional:  Negative for chills and fever.  All other systems reviewed and are negative.   Blood pressure 122/85, pulse  71, temperature 97.8 F (36.6 C), temperature source Oral, resp. rate 16, SpO2 93%. Physical Exam Vitals reviewed.  Constitutional:      Comments: Nervous but pleasant in OR holding.   HENT:     Head: Normocephalic.  Eyes:     Pupils: Pupils are equal, round, and reactive to light.  Cardiovascular:     Rate and Rhythm: Normal rate.  Abdominal:     General: Abdomen is flat.     Comments: Stable upper midline scar and moderate truncal obesity.   Genitourinary:    Comments: No CVAT at present Musculoskeletal:        General: Normal range of motion.     Cervical back: Normal range of motion.  Skin:    General: Skin is warm.  Neurological:     General: No focal deficit present.     Mental Status: He is alert.  Psychiatric:        Mood and Affect: Mood normal.      Assessment/Plan  Proceed as planned with robotic prostatectomy / node dissection. Risks, benefits, alternatives, expected peri-op course discussed previously and reiterated today. He understands that his prior major abdominal surgery increases risk of adhesions / bowel injury / need for bowel resection.   Melody Spurling., MD 01/25/2024, 7:12 AM

## 2024-01-25 NOTE — Transfer of Care (Signed)
 Immediate Anesthesia Transfer of Care Note  Patient: Paul Griffin  Procedure(s) Performed: PROSTATECTOMY, RADICAL, ROBOT-ASSISTED, LAPAROSCOPIC LYMPHADENECTOMY, PELVIS, ROBOT-ASSISTED  Patient Location: PACU  Anesthesia Type:General  Level of Consciousness: drowsy  Airway & Oxygen Therapy: Patient Spontanous Breathing and Patient connected to face mask oxygen  Post-op Assessment: Report given to RN, Post -op Vital signs reviewed and stable, and Patient moving all extremities X 4  Post vital signs: Reviewed and stable  Last Vitals:  Vitals Value Taken Time  BP 103/71 01/25/24 1138  Temp    Pulse 72 01/25/24 1140  Resp 16 01/25/24 1140  SpO2 98 % 01/25/24 1140  Vitals shown include unfiled device data.  Last Pain:  Vitals:   01/25/24 0802  TempSrc:   PainSc: 8       Patients Stated Pain Goal: 5 (01/25/24 0736)  Complications: No notable events documented.

## 2024-01-25 NOTE — Anesthesia Procedure Notes (Signed)
 Procedure Name: Intubation Date/Time: 01/25/2024 8:29 AM  Performed by: Ulanda Gambles, CRNAPre-anesthesia Checklist: Patient identified, Emergency Drugs available, Suction available and Patient being monitored Patient Re-evaluated:Patient Re-evaluated prior to induction Oxygen Delivery Method: Circle system utilized Preoxygenation: Pre-oxygenation with 100% oxygen Induction Type: IV induction Ventilation: Oral airway inserted - appropriate to patient size and Two handed mask ventilation required Laryngoscope Size: Glidescope and 4 Grade View: Grade I Tube type: Oral Tube size: 7.5 mm Number of attempts: 1 Airway Equipment and Method: Stylet and Video-laryngoscopy Placement Confirmation: ETT inserted through vocal cords under direct vision, positive ETCO2 and breath sounds checked- equal and bilateral Secured at: 23 cm Tube secured with: Tape Dental Injury: Teeth and Oropharynx as per pre-operative assessment  Comments: Glidescope used given history of difficult intubation. Small mouth opening and limited neck ROM

## 2024-01-25 NOTE — Brief Op Note (Signed)
 01/25/2024  11:33 AM  PATIENT:  Paul Griffin  58 y.o. male  PRE-OPERATIVE DIAGNOSIS:  PROSTATE CANCER  POST-OPERATIVE DIAGNOSIS:  PROSTATE CANCER  PROCEDURE:  Procedure(s): PROSTATECTOMY, RADICAL, ROBOT-ASSISTED, LAPAROSCOPIC (N/A) LYMPHADENECTOMY, PELVIS, ROBOT-ASSISTED (N/A)  SURGEON:  Surgeons and Role:    * Manny, Harvey Linen., MD - Primary  PHYSICIAN ASSISTANT:   ASSISTANTS: Alphonza Ashing MD   ANESTHESIA:   local and general  EBL:  250 mL   BLOOD ADMINISTERED:none  DRAINS: JP to bulb; Foley to gravity   LOCAL MEDICATIONS USED:  MARCAINE      SPECIMEN:  Source of Specimen:  1 - pelvic lymph nodes; 2 - prostatetomy; 3 - peri-prostatic fat  DISPOSITION OF SPECIMEN:  PATHOLOGY  COUNTS:  YES  TOURNIQUET:  * No tourniquets in log *  DICTATION: .Other Dictation: Dictation Number [id number]  PLAN OF CARE: Admit for overnight observation  PATIENT DISPOSITION:  PACU - hemodynamically stable.   Delay start of Pharmacological VTE agent (>24hrs) due to surgical blood loss or risk of bleeding: yes

## 2024-01-25 NOTE — Discharge Instructions (Signed)
 1- Drain Sites - You may have some mild persistent drainage from old drain site for several days, this is normal. This can be covered with cotton gauze for convenience.  2 - Stiches - Your stitches are all dissolvable. You may notice a "loose thread" at your incisions, these are normal and require no intervention. You may cut them flush to the skin with fingernail clippers if needed for comfort.  3 - Diet - No restrictions  4 - Activity - No heavy lifting / straining (any activities that require valsalva or "bearing down") x 4 weeks. Otherwise, no restrictions.  5 - Bathing - You may shower immediately. Do not take a bath or get into swimming pool where incision sites are submersed in water x 4 weeks.   6 - Catheter - Will remain in place until removed at your next appointment. It may be cleaned with soap and water in the shower. It may be disconnected from the drain bag while in the shower to avoid tripping over the tube. You may apply Neosporin or Vaseline ointment as needed to the tip of the penis where the catheter inserts to reduce friction and irritation in this spot.   7 - When to Call the Doctor - Call MD for any fever >102, any acute wound problems, or any severe nausea / vomiting. You can call the [facility] [phone]) 24 hours a day 365 days a year. It will roll-over to the answering service and on-call physician after hours.

## 2024-01-26 ENCOUNTER — Encounter (HOSPITAL_COMMUNITY): Payer: Self-pay | Admitting: Urology

## 2024-01-26 ENCOUNTER — Other Ambulatory Visit (HOSPITAL_COMMUNITY): Payer: Self-pay

## 2024-01-26 DIAGNOSIS — C61 Malignant neoplasm of prostate: Secondary | ICD-10-CM | POA: Diagnosis not present

## 2024-01-26 LAB — BASIC METABOLIC PANEL WITH GFR
Anion gap: 7 (ref 5–15)
BUN: 19 mg/dL (ref 6–20)
CO2: 22 mmol/L (ref 22–32)
Calcium: 7.8 mg/dL — ABNORMAL LOW (ref 8.9–10.3)
Chloride: 106 mmol/L (ref 98–111)
Creatinine, Ser: 1.07 mg/dL (ref 0.61–1.24)
GFR, Estimated: >60 mL/min (ref >60)
Glucose, Bld: 99 mg/dL (ref 70–99)
Potassium: 3.8 mmol/L (ref 3.5–5.1)
Sodium: 135 mmol/L (ref 135–145)

## 2024-01-26 LAB — HEMOGLOBIN AND HEMATOCRIT, BLOOD
HCT: 38.5 % — ABNORMAL LOW (ref 39.0–52.0)
Hemoglobin: 12.1 g/dL — ABNORMAL LOW (ref 13.0–17.0)

## 2024-01-26 MED ORDER — OXYCODONE-ACETAMINOPHEN 5-325 MG PO TABS
1.0000 | ORAL_TABLET | ORAL | 0 refills | Status: DC | PRN
  Filled 2024-01-26: qty 20, 4d supply, fill #0

## 2024-01-26 MED ORDER — SENNOSIDES-DOCUSATE SODIUM 8.6-50 MG PO TABS
1.0000 | ORAL_TABLET | Freq: Two times a day (BID) | ORAL | 0 refills | Status: AC
Start: 2024-01-26 — End: ?
  Filled 2024-01-26: qty 10, 5d supply, fill #0

## 2024-01-26 MED ORDER — CEPHALEXIN 500 MG PO CAPS
500.0000 mg | ORAL_CAPSULE | Freq: Two times a day (BID) | ORAL | 0 refills | Status: DC
  Filled 2024-01-26: qty 6, 3d supply, fill #0

## 2024-01-26 NOTE — Plan of Care (Signed)
   Problem: Education: Goal: Knowledge of the procedure and recovery process will improve Outcome: Progressing   Problem: Bowel/Gastric: Goal: Gastrointestinal status for postoperative course will improve Outcome: Progressing   Problem: Pain Management: Goal: General experience of comfort will improve Outcome: Progressing   Problem: Skin Integrity: Goal: Demonstration of wound healing without infection will improve Outcome: Progressing   Problem: Urinary Elimination: Goal: Ability to avoid or minimize complications of infection will improve Outcome: Progressing Goal: Ability to achieve and maintain urine output will improve Outcome: Progressing Goal: Home care management will improve Outcome: Progressing   Problem: Education: Goal: Knowledge of General Education information will improve Description: Including pain rating scale, medication(s)/side effects and non-pharmacologic comfort measures Outcome: Progressing   Problem: Health Behavior/Discharge Planning: Goal: Ability to manage health-related needs will improve Outcome: Progressing   Problem: Clinical Measurements: Goal: Ability to maintain clinical measurements within normal limits will improve Outcome: Progressing Goal: Will remain free from infection Outcome: Progressing Goal: Diagnostic test results will improve Outcome: Progressing Goal: Respiratory complications will improve Outcome: Progressing Goal: Cardiovascular complication will be avoided Outcome: Progressing   Problem: Activity: Goal: Risk for activity intolerance will decrease Outcome: Progressing   Problem: Nutrition: Goal: Adequate nutrition will be maintained Outcome: Progressing   Problem: Coping: Goal: Level of anxiety will decrease Outcome: Progressing   Problem: Elimination: Goal: Will not experience complications related to bowel motility Outcome: Progressing Goal: Will not experience complications related to urinary  retention Outcome: Progressing   Problem: Pain Managment: Goal: General experience of comfort will improve and/or be controlled Outcome: Progressing   Problem: Safety: Goal: Ability to remain free from injury will improve Outcome: Progressing   Problem: Skin Integrity: Goal: Risk for impaired skin integrity will decrease Outcome: Progressing

## 2024-01-26 NOTE — Discharge Summary (Signed)
 Date of admission: 01/25/2024  Date of discharge: 01/26/2024  Admission diagnosis: Prostate Cancer  Discharge diagnosis: Prostate Cancer  History and Physical: For full details, please see admission history and physical. Briefly, Paul Griffin is a 58 y.o. gentleman with localized prostate cancer.  After discussing management/treatment options, he elected to proceed with surgical treatment.  Hospital Course: Paul Griffin was taken to the operating room on 01/25/2024 and underwent a robotic assisted laparoscopic radical prostatectomy. He tolerated this procedure well and without complications. Postoperatively, he was able to be transferred to a regular hospital room following recovery from anesthesia.  He was able to begin ambulating the night of surgery. He remained hemodynamically stable overnight.  He had excellent urine output with appropriately minimal output from his pelvic drain and his pelvic drain was removed on POD #1.  He was transitioned to oral pain medication, tolerated a regular diet, and had met all discharge criteria and was able to be discharged home later on POD#1.  Laboratory values:  Recent Labs    01/25/24 1242 01/26/24 0521  HGB 14.3 12.1*  HCT 43.9 38.5*    Disposition: Home  Discharge instruction: He was instructed to be ambulatory but to refrain from heavy lifting, strenuous activity, or driving. He was instructed on urethral catheter care.  Discharge medications:   Allergies as of 01/26/2024       Reactions   Buprenorphine Hcl Other (See Comments)   hallucinations   Levitra [vardenafil] Other (See Comments)   headaches   Morphine And Codeine Other (See Comments)   hallucinations     Followup: He will followup on June 2 for foley removal.

## 2024-01-26 NOTE — Progress Notes (Signed)
   01/26/24 0929  TOC Brief Assessment  Insurance and Status Reviewed  Patient has primary care physician Yes  Home environment has been reviewed Resides in single family home with spouse  Prior level of function: Independent with ADLs at baseline  Prior/Current Home Services No current home services  Social Drivers of Health Review SDOH reviewed no interventions necessary  Readmission risk has been reviewed Yes  Transition of care needs no transition of care needs at this time

## 2024-01-26 NOTE — Op Note (Unsigned)
 NAME: Paul Griffin, Paul Griffin MEDICAL RECORD NO: [id number] ACCOUNT NO: [id number] DATE OF BIRTH: 1965/12/24 FACILITY: [facility] LOCATION: [id number] PHYSICIAN: Osborn Blaze, MD  Operative Report   PREOPERATIVE DIAGNOSIS:  Moderate-risk prostate cancer, history of extensive prior abdominal surgery.  PROCEDURE PERFORMED: 1.  Robotic-assisted laparoscopic radical prostatectomy. 2.  Bilateral pelvic lymph node dissection. 3.  Injection of indocyanine dye for sentinel lymph node angiography.  ESTIMATED BLOOD LOSS:  250 mL.  COMPLICATIONS: None.  SPECIMENS: 1.  Radical prostatectomy for permanent pathology. 2.  Right external iliac lymph node. 3.  Right obturator lymph nodes. 4.  Right periprostatic lymph nodes sentinel. 5.  Left external iliac lymph nodes sentinel. 6.  Left obturator lymph nodes.  ASSISTANT:  Alphonza Ashing, MD.  FINDINGS:  Significant omental adhesions to the anterior abdominal wall, minimal bowel adhesions.  DRAIN: 1.  Jackson-Pratt drain to bulb suction. 2.  Foley catheter to straight drain.  DESCRIPTION OF PROCEDURE:  The patient is a pleasant 58 year old man with a remote history of pyloric surgery many years ago, subsequent ventral hernia and a ventral hernia repair with mesh.  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 Given his younger age and excellent functional status, he has elected a curative path with prostatectomy. He presents for this today. Preoperative imaging with CT corroborated preserved fat planes in the anterior abdomen. I felt that this was  reasonable.  Informed consent was obtained and placed in the medical record.  PROCEDURE IN DETAIL:  The patient being Paul Griffin verified and procedure being radical prostatectomy was confirmed.  Procedure timeout was performed.  Intravenous antibiotics administered.  General endotracheal anesthesia induced.  The patient  was placed into a low lithotomy position.  Sterile field was  created, prepping and draping penis, perineum and proximal thighs using iodine and his infra-xiphoid abdomen using chlorhexidine  gluconate after clipper shaving and after further fashioning to  the operative table using 3-inch tape over foam padding across the supraxiphoid chest.  A test of steep Trendelenburg positioning was performed and found to be suitably positioned.  An LAVH type drape was placed.  A Foley catheter was placed per urethra  to straight drain.  High-flow, low-pressure pneumoperitoneum was obtained using Veress technique in the supraumbilical midline having passed the aspiration and drop test.  An 8-mm robotic camera port was then placed in position approximately 4  fingerbreadths lateral and slightly inferior to the umbilicus. Laparoscopic examination of the peritoneal cavity through this site, which was chosen to maximally avoid adhesions, did reveal some omental adhesions to the anterior abdominal wall, one loop  of bowel in the left upper quadrant coming towards the abdominal wall, but none in the area that would preclude prostatectomy.  As such, additional ports were placed as follows:  8-mm robotic camera port placed in the same location as the previous Veress  site, left far lateral 8-mm robotic port, right paramedian 8-mm robotic port, right far lateral 12 mm AirSeal assist port, right paramedian 5-mm suction port.  Robot was docked and passed the electronic checks.  Initial attention was directed at  development of space of Retzius.  Incision was made lateral to right medial umbilical ligament from the midline towards the internal ring coursing along the iliac vessels towards the area of the right ureter.  The right vas deferens was encountered,  ligated as a medial bucket handle and the right bladder wall was swept away from the pelvic sidewall towards the area of the endopelvic fascia on the  right side.  A mirror image dissection was performed on the left side.  Anterior  attachments were taken  down with cautery scissors to expose the anterior base of the prostate, which was defatted to better denote the prostate-bladder neck junction.  Next, attention was directed to the sentinel lymph node angiography.  A percutaneously placed robotic guided spinal needle was placed just above the pubic symphysis and 0.2 mL of Indocyanine green dye was injected into each lobe of the prostate for sentinel lymph node angiography.  Next, the endopelvic fascia was then  swept away from the lateral aspect of the prostate to the base of his orientation exposed dorsal venous complex which was carefully controlled using a green load stapler.  This revealed an excellent hemostatic control. No evidence of membranous or  urethral injury.  It had been approximately 15 minutes post dye injection. The pelvis was inspected under near infrared fluorescence light.  Sentinel lymph node angiography revealed excellent parenchymal uptake of the prostate.  There were several lymphatic channels seen coursing towards the pelvic lymph nodes bilaterally. There was a small amount of dye spillage on the right side, but  lymphatic channels were still seen.  Standard template lymphadenectomy was then performed on the right side first to the right external iliac group with the boundaries being the right external iliac artery, vein, and pelvic sidewall.  Lymphostasis  achieved with cold clips, set aside and labeled as right external iliac lymph nodes.  Next, the right obturator group was dissected free with boundaries being right external iliac vein, pelvic sidewall, and obturator nerve.  Lymphostasis achieved with  cold clips, set aside and labeled as right obturator lymph nodes.  The right obturator nerve was inspected following maneuvers and found to be uninjured.  There was a very vivid sentinel lymphatic channel and a periprostatic orientation with what  appeared to be a very small lymph node along this.  This  was then dissected free, set aside and labeled as such. A mirror-image lymphadenectomy was performed on the left side of the left external iliac and left obturator groups respectively.  The left  obturator nerve was also inspected and found to be uninjured.  Attention was directed to bladder neck dissection. The bladder neck was identified by moving the Foley catheter back and forth and a lateral release was performed on each side to better  denote the bladder neck prostate caliber and the bladder neck was separated from the prostate in an anterior to posterior direction keeping what appeared to be a rim of circular muscle fibers with each plane of dissection.  I was quite happy with the safety of this  dissection.  Essentially having performed a complete preservation of the structure, a posterior dissection was performed incising approximately 7 mm inferior posterior to the posterior lip of the prostate injury, entering the plane of Denonvilliers.   Bilateral seminal vesicles were quite large and somewhat desmoplastic; however, they were very carefully dissected to their tips, placed on gentle superior traction.  Bilateral vas deferens were encountered, dissected for a distance of approximately 4  cm, ligated and placed on gentle superior traction.  Dissection proceeded within the plane of Denonvilliers' inferior towards the area of the apex of the prostate, which was denoted by anterior curvature. This exposed the vascular pedicles of the  prostate. Aggressive nerve sparing was performed bilaterally and the true vascular pedicles were controlled using sequential clipping technique in a base to apex orientation.  I was quite happy with the  ____. Final apical dissection was performed in the  anterior plane, placing the prostate on superior traction and transecting the membranous urethra coldly.  This completely freed up the prostate.  Specimen was placed into an EndoCatch bag for later retrieval.  Next, a  digital rectal exam was performed  using an indicator glove under laparoscopic vision. No evidence of rectal violation was noted.  Posterior resection was performed using 3-0 V-Loc suture reapproximating the posterior urethral plate to the posterior bladder neck, bringing the structures  in tension-free apposition and finally, mucosa-to-mucosa anastomosis was performed using double-arm 3-0 V-Loc suture from the 6 o'clock to the 12 o'clock position, this resulted in excellent tension-free apposition.  The Foley catheter was placed and  irrigated quantitatively.  Sponge and needle counts were correct.  Hemostasis was excellent. Closed suction drain was brought through the previous left lateral most robotic port site into the area of the peritoneal cavity.  ____was applied.  Specimen was  retrieved by extending the previous camera port site inferiorly for a total distance of approximately 3 cm purposely through the fascia and purposely through his mesh.  The prostate specimen was removed, set aside for permanent pathology.  Extraction  site was closed to the fascia using interrupted Novafil x5 followed by reapproximation of the Scarpa's with running Vicryl.  All incision sites were infiltrated with dilute lipolyzed Marcaine  and closed at the level of the skin using subcuticular  Monocryl followed by Dermabond.  Procedure was then terminated.  The patient tolerated the procedure well.  No immediate periprocedural complications.  The patient was taken to postanesthesia care unit in stable condition.  Plan for observation  admission.   NIK D: 01/25/2024 11:43:23 am T: 01/26/2024 3:17:00 am  JOB: [id number]/ [id number]

## 2024-01-31 LAB — SURGICAL PATHOLOGY

## 2024-02-03 ENCOUNTER — Other Ambulatory Visit (HOSPITAL_COMMUNITY): Payer: Self-pay

## 2024-02-06 NOTE — Anesthesia Postprocedure Evaluation (Signed)
 Anesthesia Post Note  Patient: EDDI HYMES  Procedure(s) Performed: PROSTATECTOMY, RADICAL, ROBOT-ASSISTED, LAPAROSCOPIC LYMPHADENECTOMY, PELVIS, ROBOT-ASSISTED     Patient location during evaluation: PACU Anesthesia Type: General Level of consciousness: awake and alert Pain management: pain level controlled Vital Signs Assessment: post-procedure vital signs reviewed and stable Respiratory status: spontaneous breathing, nonlabored ventilation, respiratory function stable and patient connected to nasal cannula oxygen Cardiovascular status: blood pressure returned to baseline and stable Postop Assessment: no apparent nausea or vomiting Anesthetic complications: no   No notable events documented.  Last Vitals:  Vitals:   01/26/24 0606 01/26/24 0904  BP: (!) 105/55 106/70  Pulse: 76 83  Resp: 16 17  Temp: 36.8 C 36.7 C  SpO2: 94% 100%    Last Pain:  Vitals:   01/26/24 1022  TempSrc:   PainSc: 4                  Amyre Segundo S

## 2024-02-08 ENCOUNTER — Other Ambulatory Visit: Payer: Self-pay | Admitting: Physician Assistant

## 2024-04-04 ENCOUNTER — Ambulatory Visit: Payer: Self-pay

## 2024-04-04 NOTE — Telephone Encounter (Signed)
 FYI Only or Action Required?: Action required by provider: request for appointment.  Patient was last seen in primary care on .  Called Nurse Triage reporting Rectal Bleeding.  Symptoms began a week ago.  Interventions attempted: Nothing.   Symptoms are: gradually worsening. Pt. Calling in for  GI. Reports having rectal bleeding x 2 weeks. Concerned because he has prostate cancer, tearful. Pt. Transferred to GI office.  Triage Disposition: See Physician Within 24 Hours  Patient/caregiver understands and will follow disposition?: Yes   Copied from CRM [id number]. Topic: Clinical - Red Word Triage >> Apr 04, 2024  1:14 PM Frederich PARAS wrote: [facility] that prompted transfer to Nurse Triage: bleeding PT Calling, he was a pt of dr roanne who retired. he has a active case for prostate cancer going on, it has been a couple of weeks that he is bleeding when he go to the restroom, pt is crying on the phone. Reason for Disposition  MODERATE rectal bleeding (e.g., small blood clots, passing blood without stool, or toilet water  turns red)  Answer Assessment - Initial Assessment Questions 1. APPEARANCE of BLOOD: What color is it? Is it passed separately, on the surface of the stool, or mixed in with the stool?      Dark red 2. AMOUNT: How much blood was passed?      Large amount 3. FREQUENCY: How many times has blood been passed with the stools?      Frequently 4. ONSET: When was the blood first seen in the stools? (Days or weeks)      2 weeks 5. DIARRHEA: Is there also some diarrhea? If Yes, ask: How many diarrhea stools in the past 24 hours?      no 6. CONSTIPATION: Do you have constipation? If Yes, ask: How bad is it?     no 7. RECURRENT SYMPTOMS: Have you had blood in your stools before? If Yes, ask: When was the last time? and What happened that time?      yes 8. BLOOD THINNERS: Do you take any blood thinners? (e.g., aspirin , clopidogrel / Plavix, coumadin ,  heparin ). Notes: Other strong blood thinners include: Arixtra (fondaparinux), Eliquis (apixaban), Pradaxa (dabigatran), and Xarelto (rivaroxaban).     no 9. OTHER SYMPTOMS: Do you have any other symptoms?  (e.g., abdomen pain, vomiting, dizziness, fever)     no 10. PREGNANCY: Is there any chance you are pregnant? When was your last menstrual period?       N/a  Protocols used: Rectal Bleeding-A-AH

## 2024-04-24 NOTE — Progress Notes (Unsigned)
 04/25/2024 SCOUT GUMBS [id number] [birth year]  Referring provider: [name], MD Primary GI doctor: Dr. San (Dr. Aneita)  ASSESSMENT AND PLAN:  Rectal bleeding x 1 month, rectal pain and possible narrowing on exam, recent prostatectomy 5/21 for prostate cancer denies radiation treatment however may proceed with this pending next labs. BRB blood in toilet Unable to do exam due to pain, no fissure seen, negative FOBT Long discussion with the patient, he prefers to proceed with colonoscopy due to rectal bleeding, change in BMs - Schedule colonoscopy to evaluate rectal bleeding. - Prescribe suppository for hemorrhoid management; provide cream if cost is prohibitive. - Order abdominal x-ray to assess bowel status. - Consider hemorrhoidal banding in the office if colonoscopy unremarkable and conservative measures are not helpful.  Family history of colon cancer/personal history of adenomatous polyps 05/31/2022 colonoscopy bowel prep adequate after extensive lavage, difficult due to looping and tortuous colon 4 mm polyp transverse colon, 5 polyps 5 to 7 mm descending transverse ascending colon, internal hemorrhoids Polyps benign recall 5 years but patient states he thinks it should be 3 years, attempted to explain to the patient that screening colonoscopies are very dependent on what is found on the colonoscopy in addition to family history however patient was very adamant should be 3 years and wanted to make that known. Due to acute rectal bleeding rectal discomfort, abnormal rectal exam, change in bowel habits will schedule colonoscopy sooner and recall based off of this colonoscopy.  GERD with history of dysphagia s/p dilation 05/2022 that did not appear to help signficantly GERD well controlled on prilosec 40 mg daily 02/15/2022 barium swallow nonspecific esophagitis, distal esophageal tapering without stricture, dysmotility with delayed clearance of contrast and  barium 05/31/2022 EGD for dysphagia and GERD esophageal nodular mucosal changes present, esophagus empirically dilated normal stomach normal duodenum.  Pathology negative for EOE showed esophagitis changes Rare hydrocodone  2-3 x a week Declines evaluation at this time Discussed repeating barium swallow possible dysmotility Will call if any worsening symptoms Dysmotility diet given to the patient  Acute idiopathic pericarditis Follows with [place] [facility] 06/2021 echo ejection fraction 55 to 60% grade 1 diastolic dysfunction  History of prostate cancer Status post prostatectomy 01/25/2024 Likely contributing to change in bowel habits and potentially rectal bleeding No radiation at this time however he is following up with oncology and this may be considered pending pathology.  Patient Care Team: [name], MD as PCP - General (Internal Medicine)  HISTORY OF PRESENT ILLNESS: 58 y.o. male with a past medical history listed below presents for evaluation of abdominal pain.   Patient last seen in the office 02/08/2022 for dysphagia by Greig Corti, PA-C  Discussed the use of AI scribe software for clinical note transcription with the patient, who gave verbal consent to proceed.  History of Present Illness   Paul Griffin is a 58 year old male with a history of prostate cancer who presents with rectal bleeding.  He has been experiencing rectal bleeding for at least a month, describing it as significant bleeding in the toilet bowl. Initially, he attributed the bleeding to internal hemorrhoids, but it did not resolve and worsened over time. He underwent a prostatectomy in May 2025 for prostate cancer, and the bleeding began approximately a month ago, around July 2025.  He has a history of difficulty swallowing, which was previously addressed with an endoscopy and dilation in September 2023. The dilation provided temporary relief, but the swallowing difficulties returned  shortly  after. He continues to experience reflux, which is managed with daily omeprazole  (Prilosec) before breakfast. If he stops the medication, his symptoms worsen.  He takes hydrocodone  as needed, approximately two to three times a week, for pain related to past injuries and surgeries. He denies constipation and reports having more frequent bowel movements than before, with at least one or two movements daily. He describes these as complete bowel movements, although they have become looser recently.  He has a significant family history of cancer, which influences his proactive approach to health screenings. He has undergone multiple colonoscopies in the past due to benign polyps and family history, with the most recent one in September 2023.  No shortness of breath, chest pain, fevers, chills, or nighttime symptoms. His wife, however, believes he experiences shortness of breath. He has undergone two neck surgeries and was supposed to have back surgery before his prostate cancer diagnosis. He reports urinary dribbling following his prostatectomy but no pain or discomfort with urination.      He  reports that he has never smoked. He has never been exposed to tobacco smoke. He has never used smokeless tobacco. He reports current alcohol use. He reports that he does not use drugs.  RELEVANT GI HISTORY, IMAGING AND LABS: Results   RADIOLOGY Barium swallow: Dysmotility with delayed clearance of contrast in esophagus (2023)  PROCEDURE NOTES Endoscopy: Dilation performed (05/31/2022) Colonoscopy: Benign polyps removed (05/31/2022)      CBC    Component Value Date/Time   WBC 9.9 01/17/2024 1313   RBC 5.06 01/17/2024 1313   HGB 12.1 (L) 01/26/2024 0521   HCT 38.5 (L) 01/26/2024 0521   PLT 218 01/17/2024 1313   MCV 90.3 01/17/2024 1313   MCH 29.4 01/17/2024 1313   MCHC 32.6 01/17/2024 1313   RDW 13.6 01/17/2024 1313   LYMPHSABS 1.5 01/18/2014 1745   MONOABS 0.4 01/18/2014 1745   EOSABS 0.1  01/18/2014 1745   BASOSABS 0.0 01/18/2014 1745   Recent Labs    01/17/24 1313 01/25/24 1242 01/26/24 0521  HGB 14.9 14.3 12.1*    CMP     Component Value Date/Time   NA 135 01/26/2024 0521   NA 142 03/18/2021 0958   K 3.8 01/26/2024 0521   CL 106 01/26/2024 0521   CO2 22 01/26/2024 0521   GLUCOSE 99 01/26/2024 0521   BUN 19 01/26/2024 0521   BUN 20 03/18/2021 0958   CREATININE 1.07 01/26/2024 0521   CALCIUM  7.8 (L) 01/26/2024 0521   PROT 7.6 02/11/2021 0745   ALBUMIN 3.8 02/11/2021 0745   AST 26 02/11/2021 0745   ALT 25 02/11/2021 0745   ALKPHOS 108 02/11/2021 0745   BILITOT 2.0 (H) 02/11/2021 0745   GFRNONAA >60 01/26/2024 0521   GFRAA >90 01/18/2014 1745      Latest Ref Rng & Units 02/11/2021    7:45 AM 01/18/2014    5:45 PM 01/02/2013    4:40 AM  Hepatic Function  Total Protein 6.5 - 8.1 g/dL 7.6  7.3  7.2   Albumin 3.5 - 5.0 g/dL 3.8  4.1  3.2   AST 15 - 41 U/L 26  22  30    ALT 0 - 44 U/L 25  19  39   Alk Phosphatase 38 - 126 U/L 108  115  151   Total Bilirubin 0.3 - 1.2 mg/dL 2.0  0.6  0.5       Current Medications:    Current Outpatient Medications (Cardiovascular):    rosuvastatin (  CRESTOR) 20 MG tablet, Take 20 mg by mouth every evening.   Current Outpatient Medications (Analgesics):    celecoxib  (CELEBREX ) 200 MG capsule, Take 200 mg by mouth 2 (two) times daily.   oxyCODONE -acetaminophen  (PERCOCET) 5-325 MG tablet, Take 1 tablet by mouth every 4 (four) hours as needed for severe pain (pain score 7-10) or moderate pain (pain score 4-6) (post-op pain).   Current Outpatient Medications (Other):    cephALEXin  (KEFLEX ) 500 MG capsule, Take 1 capsule (500 mg total) by mouth 2 (two) times daily. X 3 days. Begin day before next Urology appointment.   gabapentin  (NEURONTIN ) 100 MG capsule, Take 100 mg by mouth at bedtime.   hydrocortisone  (ANUSOL -HC) 2.5 % rectal cream, Place 1 Application rectally 2 (two) times daily.   hydrocortisone  (ANUSOL -HC) 25 MG  suppository, Place 1 suppository (25 mg total) rectally 2 (two) times daily.   LORazepam  (ATIVAN ) 0.5 MG tablet, Take 0.5 mg by mouth 2 (two) times daily as needed for anxiety.   methocarbamol  (ROBAXIN ) 750 MG tablet, Take 1 tablet (750 mg total) by mouth every 8 (eight) hours as needed for muscle spasms.   Multiple Vitamins-Minerals (CENTRUM SILVER 50+MEN PO), Take by mouth.   Na Sulfate-K Sulfate-Mg Sulfate concentrate (SUPREP BOWEL PREP KIT) 17.5-3.13-1.6 GM/177ML SOLN, Take 1 kit (354 mLs total) by mouth once for 1 dose.   omeprazole  (PRILOSEC) 40 MG capsule, TAKE 1 CAPSULE(40 MG) BY MOUTH DAILY BEFORE BREAKFAST   senna-docusate (SENOKOT-S) 8.6-50 MG tablet, Take 1 tablet by mouth 2 (two) times daily. While taking strong pain meds to prevent constipation   sertraline  (ZOLOFT ) 100 MG tablet, Take 100 mg by mouth in the morning.  Medical History:  Past Medical History:  Diagnosis Date   Abdominal distension    Anemia    Anxiety    panic attacks   Blood in stool    Cancer (HCC)    prostate cancer   Chronic back pain    neck to sacral (01/01/2013)   Clotting disorder (HCC)    PE right    Constipation    DDD (degenerative disc disease), cervical    DDD (degenerative disc disease), lumbar    DDD (degenerative disc disease), lumbosacral    DDD (degenerative disc disease), thoracolumbar    Depression    Diarrhea    Difficulty urinating    Dyslipidemia    good cholesterol isn't as high as dr would like (01/01/2013)   Erectile dysfunction    organic   Family history of anesthesia complication    Mom; PONV (01/01/2013)   Family history of colon cancer    Generalized headaches    monthly (01/01/2013)   GERD (gastroesophageal reflux disease)    History of blood transfusion 1980's   related to bleeding ulcers (01/01/2013)   Hoarseness of voice    chronic   Hyperlipidemia    Lipoma of arm 1994   right   Mitral valve prolapse    mild (01/01/2013)   Nasal congestion     Obstructive sleep apnea    miald w aith RDI of 6.4 per hour   Osteoarthritis    hips & hands (01/01/2013)   Palpitations    Peptic ulcer 1994   severe requiring vagotomy and pyloroplasty   PONV (postoperative nausea and vomiting)    Prostate cancer (HCC) 2025   Pulmonary embolism on right (HCC) 01/01/2013   Rectal bleeding    Trouble swallowing    Tubular adenoma of colon 08/2012   Ulcer  gastric 1988   Allergies:  Allergies  Allergen Reactions   Buprenorphine Hcl Other (See Comments)    hallucinations   Levitra [Vardenafil] Other (See Comments)    headaches   Morphine And Codeine Other (See Comments)    hallucinations     Surgical History:  He  has a past surgical history that includes Vagotomy and pyloroplasty (09/06/1986); Colonoscopy (07/08/2007); Tonsillectomy (09/06/2006); Posterior cervical fusion/foraminotomy (09/06/2010); Incisional hernia repair (N/A, 12/25/2012); Insertion of mesh (N/A, 12/25/2012); Excisional hemorrhoidectomy; Hernia repair (12/25/2012); left heart catheterization with coronary angiogram (N/A, 10/15/2013); LEFT HEART CATH AND CORONARY ANGIOGRAPHY (N/A, 02/11/2021); THORACIC AORTOGRAM (N/A, 02/11/2021); Rotator cuff repair (Right, 2023); Lumbar fusion; Reverse shoulder arthroplasty (Right, 07/28/2023); eight shoulder replacement ; and Robot assisted laparoscopic radical prostatectomy (N/A, 01/25/2024). Family History:  His family history includes Breast cancer in his sister; COPD in his mother; Cancer in his sister; Colon cancer in his mother; Colon polyps in his brother, brother, father, mother, sister, sister, sister, sister, and sister; Heart disease in his father; Kidney cancer in his father; Lymphoma in his mother; Melanoma in his mother; Ovarian cancer in his sister; Ulcerative colitis in his sister.  REVIEW OF SYSTEMS  : All other systems reviewed and negative except where noted in the History of Present Illness.  PHYSICAL EXAM: BP 112/82    Pulse 70   Ht 5' 11 (1.803 m)   Wt 246 lb (111.6 kg)   BMI 34.31 kg/m  Physical Exam   GENERAL APPEARANCE: Well nourished, in no apparent distress. HEENT: No cervical lymphadenopathy, unremarkable thyroid , sclerae anicteric, conjunctiva pink. RESPIRATORY: Respiratory effort normal, breath sounds equal bilaterally without rales, rhonchi, or wheezing. CARDIO: Regular rate and rhythm with no murmurs, rubs, or gallops, peripheral pulses intact. ABDOMEN: Soft, non-distended, active bowel sounds in all four quadrants, no tenderness to palpation, no rebound, no mass appreciated. RECTAL: Anus examined, no abnormalities. External hemorrhoid present, not thrombosed. Internal exam limited due to discomfort. MUSCULOSKELETAL: Full range of motion, normal gait, without edema. SKIN: Dry, intact without rashes or lesions. No jaundice. NEURO: Alert, oriented, no focal deficits. PSYCH: Cooperative, normal mood and affect.      Alan JONELLE Coombs, PA-C 10:24 AM

## 2024-04-25 ENCOUNTER — Encounter: Payer: Self-pay | Admitting: Physician Assistant

## 2024-04-25 ENCOUNTER — Ambulatory Visit: Admitting: Physician Assistant

## 2024-04-25 ENCOUNTER — Ambulatory Visit (INDEPENDENT_AMBULATORY_CARE_PROVIDER_SITE_OTHER)
Admission: RE | Admit: 2024-04-25 | Discharge: 2024-04-25 | Disposition: A | Discharge status: Home / Self Care | Source: Ambulatory Visit | Attending: Physician Assistant | Admitting: Physician Assistant

## 2024-04-25 VITALS — BP 112/82 | HR 70 | Ht 71.0 in | Wt 246.0 lb

## 2024-04-25 DIAGNOSIS — R1319 Other dysphagia: Secondary | ICD-10-CM

## 2024-04-25 DIAGNOSIS — Z860101 Personal history of adenomatous and serrated colon polyps: Secondary | ICD-10-CM | POA: Diagnosis not present

## 2024-04-25 DIAGNOSIS — K625 Hemorrhage of anus and rectum: Secondary | ICD-10-CM

## 2024-04-25 DIAGNOSIS — K219 Gastro-esophageal reflux disease without esophagitis: Secondary | ICD-10-CM

## 2024-04-25 MED ORDER — HYDROCORTISONE (PERIANAL) 2.5 % EX CREA: 1.0000 | TOPICAL_CREAM | Freq: Two times a day (BID) | CUTANEOUS | 2 refills | Status: AC

## 2024-04-25 MED ORDER — NA SULFATE-K SULFATE-MG SULF 17.5-3.13-1.6 GM/177ML PO SOLN: 1.0000 | Freq: Once | ORAL | 0 refills | Status: AC

## 2024-04-25 MED ORDER — HYDROCORTISONE ACETATE 25 MG RE SUPP
25.0000 mg | Freq: Two times a day (BID) | RECTAL | 0 refills | Status: DC
Start: 2024-04-25 — End: 2024-05-31

## 2024-04-25 NOTE — Patient Instructions (Addendum)
 Your provider has requested that you have an abdominal x ray before leaving today. Please go to the basement floor to our Radiology department for the test.  Please do sitz baths- these can be found at the pharmacy. It is a Chief Operating Officer that is put in your toliet.  Please increase fiber or add benefiber, increase water  and increase acitivity.  Will send in hydrocoritsone suppository, cheapest with [employer]RX from [employer], [employer], [employer] or [employer] if your insurance does not pay for it. If the hemorrhoid suppository sent in is too expensive you can do this over the counter trick.  Apply a pea size amount of generic prescription Anusol  HC cream that has been sent into your pharmacy to the tip of an over the counter PrepH suppository and insert rectally once every night for at least 7 nights.  If this does not improve there are procedures that can be done.   About Hemorrhoids  Hemorrhoids are swollen veins in the lower rectum and anus.  Also called piles, hemorrhoids are a common problem.  Hemorrhoids may be internal (inside the rectum) or external (around the anus).  Internal Hemorrhoids  Internal hemorrhoids are often painless, but they rarely cause bleeding.  The internal veins may stretch and fall down (prolapse) through the anus to the outside of the body.  The veins may then become irritated and painful.  External Hemorrhoids  External hemorrhoids can be easily seen or felt around the anal opening.  They are under the skin around the anus.  When the swollen veins are scratched or broken by straining, rubbing or wiping they sometimes bleed.  How Hemorrhoids Occur  Veins in the rectum and around the anus tend to swell under pressure.  Hemorrhoids can result from increased pressure in the veins of your anus or rectum.  Some sources of pressure are:  Straining to have a bowel movement because of constipation Waiting too long to have a bowel movement Coughing and sneezing  often Sitting for extended periods of time, including on the toilet Diarrhea Obesity Trauma or injury to the anus Some liver diseases Stress Family history of hemorrhoids Pregnancy  Pregnant women should try to avoid becoming constipated, because they are more likely to have hemorrhoids during pregnancy.  In the last trimester of pregnancy, the enlarged uterus may press on blood vessels and causes hemorrhoids.  In addition, the strain of childbirth sometimes causes hemorrhoids after the birth.  Symptoms of Hemorrhoids  Some symptoms of hemorrhoids include: Swelling and/or a tender lump around the anus Itching, mild burning and bleeding around the anus Painful bowel movements with or without constipation Bright red blood covering the stool, on toilet paper or in the toilet bowel.   Symptoms usually go away within a few days.  Always talk to your doctor about any bleeding to make sure it is not from some other causes.  Diagnosing and Treating Hemorrhoids  Diagnosis is made by an examination by your healthcare provider.  Special test can be performed by your doctor.    Most cases of hemorrhoids can be treated with: High-fiber diet: Eat more high-fiber foods, which help prevent constipation.  Ask for more detailed fiber information on types and sources of fiber from your healthcare provider. Fluids: Drink plenty of water .  This helps soften bowel movements so they are easier to pass. Sitz baths and cold packs: Sitting in lukewarm water  two or three times a day for 15 minutes cleases the anal area and may relieve discomfort.  If  the water  is too hot, swelling around the anus will get worse.  Placing a cloth-covered ice pack on the anus for ten minutes four times a day can also help reduce selling.  Gently pushing a prolapsed hemorrhoid back inside after the bath or ice pack can be helpful. Medications: For mild discomfort, your healthcare provider may suggest over-the-counter pain medication  or prescribe a cream or ointment for topical use.  The cream may contain witch hazel, zinc oxide or petroleum jelly.  Medicated suppositories are also a treatment option.  Always consult your doctor before applying medications or creams. Procedures and surgeries: There are also a number of procedures and surgeries to shrink or remove hemorrhoids in more serious cases.  Talk to your physician about these options.  You can often prevent hemorrhoids or keep them from becoming worse by maintaining a healthy lifestyle.  Eat a fiber-rich diet of fruits, vegetables and whole grains.  Also, drink plenty of water  and exercise regularly.   2007, [employer] Doc.30  Behavioral and Dietary Strategies for Management of Esophageal Dysmotility/dysphagia LET ME KNOW IF YOU WANT THAT BARIUM SWALLOW 1. Take reflux medications 30+ minutes before food in the morning.  2. Begin meals with warm beverage 3. Eat smaller more frequent meals 4. Eat slowly, taking small bites and sips 5. Alternate solids and liquids 6. Avoid foods/liquids that increase acid production 7. Sit upright during and for 30+ minutes after meals to facilitate esophageal clearing 8. Can try altoid melting in mouth before food  You have been scheduled for a colonoscopy. Please follow written instructions given to you at your visit today.   If you use inhalers (even only as needed), please bring them with you on the day of your procedure.  DO NOT TAKE 7 DAYS PRIOR TO TEST- Trulicity (dulaglutide) Ozempic, Wegovy (semaglutide) Mounjaro (tirzepatide) Bydureon Bcise (exanatide extended release)  DO NOT TAKE 1 DAY PRIOR TO YOUR TEST Rybelsus (semaglutide) Adlyxin (lixisenatide) Victoza (liraglutide) Byetta (exanatide) ___________________________________________________________________________    Due to recent changes in healthcare laws, you may see the results of your imaging and laboratory studies on MyChart before your  provider has had a chance to review them.  We understand that in some cases there may be results that are confusing or concerning to you. Not all laboratory results come back in the same time frame and the provider may be waiting for multiple results in order to interpret others.  Please give us  48 hours in order for your provider to thoroughly review all the results before contacting the office for clarification of your results.    I appreciate the  opportunity to care for you  Thank You   [facility]

## 2024-04-26 ENCOUNTER — Other Ambulatory Visit (HOSPITAL_COMMUNITY): Payer: Self-pay

## 2024-04-26 ENCOUNTER — Ambulatory Visit: Payer: Self-pay | Admitting: Physician Assistant

## 2024-04-26 ENCOUNTER — Telehealth: Payer: Self-pay

## 2024-04-26 NOTE — Telephone Encounter (Signed)
 Pharmacy Patient Advocate Encounter   Received notification from CoverMyMeds that prior authorization for hydrocortisone  (ANUSOL -HC) 25 MG suppository is required/requested.   Insurance verification completed.   The patient is insured through [employer] .   Per test claim: Medication is a plan exclusion and insurance will not pay

## 2024-04-30 NOTE — Progress Notes (Signed)
 Agree with the assessment and plan as outlined by Quentin Mulling, PA-C. ? ?Keron Neenan, DO, FACG ? ?

## 2024-05-24 ENCOUNTER — Encounter: Payer: Self-pay | Admitting: Gastroenterology

## 2024-05-31 ENCOUNTER — Ambulatory Visit (AMBULATORY_SURGERY_CENTER): Admitting: Gastroenterology

## 2024-05-31 ENCOUNTER — Encounter: Payer: Self-pay | Admitting: Gastroenterology

## 2024-05-31 VITALS — BP 102/72 | HR 63 | Temp 98.1°F | Resp 14 | Ht 71.0 in | Wt 246.0 lb

## 2024-05-31 DIAGNOSIS — Z800 Family history of malignant neoplasm of digestive organs: Secondary | ICD-10-CM | POA: Diagnosis not present

## 2024-05-31 DIAGNOSIS — D125 Benign neoplasm of sigmoid colon: Secondary | ICD-10-CM | POA: Diagnosis not present

## 2024-05-31 DIAGNOSIS — D123 Benign neoplasm of transverse colon: Secondary | ICD-10-CM | POA: Diagnosis present

## 2024-05-31 DIAGNOSIS — D124 Benign neoplasm of descending colon: Secondary | ICD-10-CM

## 2024-05-31 DIAGNOSIS — D127 Benign neoplasm of rectosigmoid junction: Secondary | ICD-10-CM | POA: Diagnosis not present

## 2024-05-31 DIAGNOSIS — K640 First degree hemorrhoids: Secondary | ICD-10-CM | POA: Diagnosis not present

## 2024-05-31 DIAGNOSIS — K562 Volvulus: Secondary | ICD-10-CM

## 2024-05-31 DIAGNOSIS — Z860101 Personal history of adenomatous and serrated colon polyps: Secondary | ICD-10-CM | POA: Diagnosis not present

## 2024-05-31 DIAGNOSIS — K625 Hemorrhage of anus and rectum: Secondary | ICD-10-CM

## 2024-05-31 DIAGNOSIS — D122 Benign neoplasm of ascending colon: Secondary | ICD-10-CM | POA: Diagnosis not present

## 2024-05-31 DIAGNOSIS — K639 Disease of intestine, unspecified: Secondary | ICD-10-CM | POA: Diagnosis not present

## 2024-05-31 MED ORDER — SODIUM CHLORIDE 0.9 % IV SOLN: 500.0000 mL | Freq: Once | INTRAVENOUS | Status: DC

## 2024-05-31 NOTE — Patient Instructions (Addendum)
 Resume previous diet.  Await pathology results. Continue present medications.   YOU HAD AN ENDOSCOPIC PROCEDURE TODAY AT THE [facility]:   Refer to the procedure report that was given to you for any specific questions about what was found during the examination.  If the procedure report does not answer your questions, please call your gastroenterologist to clarify.  If you requested that your care partner not be given the details of your procedure findings, then the procedure report has been included in a sealed envelope for you to review at your convenience later.  YOU SHOULD EXPECT: Some feelings of bloating in the abdomen. Passage of more gas than usual.  Walking can help get rid of the air that was put into your GI tract during the procedure and reduce the bloating. If you had a lower endoscopy (such as a colonoscopy or flexible sigmoidoscopy) you may notice spotting of blood in your stool or on the toilet paper. If you underwent a bowel prep for your procedure, you may not have a normal bowel movement for a few days.  Please Note:  You might notice some irritation and congestion in your nose or some drainage.  This is from the oxygen used during your procedure.  There is no need for concern and it should clear up in a day or so.  SYMPTOMS TO REPORT IMMEDIATELY:  Following lower endoscopy (colonoscopy or flexible sigmoidoscopy):  Excessive amounts of blood in the stool  Significant tenderness or worsening of abdominal pains  Swelling of the abdomen that is new, acute  Fever of 100F or higher  For urgent or emergent issues, a gastroenterologist can be reached at any hour by calling [phone]. Do not use MyChart messaging for urgent concerns.    DIET:  We do recommend a small meal at first, but then you may proceed to your regular diet.  Drink plenty of fluids but you should avoid alcoholic beverages for 24 hours.  ACTIVITY:  You should plan to take it easy for the rest  of today and you should NOT DRIVE or use heavy machinery until tomorrow (because of the sedation medicines used during the test).    FOLLOW UP: Our staff will call the number listed on your records the next business day following your procedure.  We will call around 7:15- 8:00 am to check on you and address any questions or concerns that you may have regarding the information given to you following your procedure. If we do not reach you, we will leave a message.     If any biopsies were taken you will be contacted by phone or by letter within the next 1-3 weeks.  Please call us  at [phone] if you have not heard about the biopsies in 3 weeks.    SIGNATURES/CONFIDENTIALITY: You and/or your care partner have signed paperwork which will be entered into your electronic medical record.  These signatures attest to the fact that that the information above on your After Visit Summary has been reviewed and is understood.  Full responsibility of the confidentiality of this discharge information lies with you and/or your care-partner.

## 2024-05-31 NOTE — Op Note (Signed)
 [facility] Patient Name: Paul Griffin Procedure Date: 05/31/2024 1:26 PM MRN: [id number] Endoscopist: Sandor Flatter , MD, [id number] Age: 58 Referring MD:  Date of Birth: 08-26-1966 Gender: Male Account #: [id number] Procedure:                Colonoscopy Indications:              Hematochezia, Change in bowel habits                           History of colon polyps                           Family history of colon cancer                           Last colonoscopy was 05/2022 and notable for benign                            polyps. Medicines:                Monitored Anesthesia Care Procedure:                Pre-Anesthesia Assessment:                           - Prior to the procedure, a History and Physical                            was performed, and patient medications and                            allergies were reviewed. The patient's tolerance of                            previous anesthesia was also reviewed. The risks                            and benefits of the procedure and the sedation                            options and risks were discussed with the patient.                            All questions were answered, and informed consent                            was obtained. Prior Anticoagulants: The patient has                            taken no anticoagulant or antiplatelet agents. ASA                            Grade Assessment: II - A patient with mild systemic  disease. After reviewing the risks and benefits,                            the patient was deemed in satisfactory condition to                            undergo the procedure.                           After obtaining informed consent, the colonoscope                            was passed under direct vision. Throughout the                            procedure, the patient's blood pressure, pulse, and                            oxygen saturations were  monitored continuously. The                            Olympus Scope [id number] was introduced through the                            anus and advanced to the the cecum, identified by                            appendiceal orifice and ileocecal valve. The                            colonoscopy was technically difficult and complex                            due to significant looping. The patient tolerated                            the procedure well. The quality of the bowel                            preparation was good. The ileocecal valve,                            appendiceal orifice, and rectum were photographed. Scope In: 1:48:38 PM Scope Out: 2:27:19 PM Scope Withdrawal Time: 0 hours 28 minutes 51 seconds  Total Procedure Duration: 0 hours 38 minutes 41 seconds  Findings:                 The perianal and digital rectal examinations were                            normal.                           Many sessile polyps were found in the transverse  colon and ascending colon. The polyps were 1 to 3                            mm in size. 16 of these polyps were removed with a                            cold snare. Resection and retrieval were complete.                            Estimated blood loss was minimal.                           Many sessile polyps were found in the descending                            colon. The polyps were 1 to 3 mm in size. 8 of                            these polyps were removed with a cold snare.                            Resection and retrieval were complete. Estimated                            blood loss was minimal.                           Many sessile polyps were found in the recto-sigmoid                            colon and sigmoid colon. The polyps were 1 to 3 mm                            in size. 9 of these polyps were removed with a cold                            snare. Resection and retrieval were complete.                             Estimated blood loss was minimal.                           Non-bleeding internal hemorrhoids were found during                            retroflexion. The hemorrhoids were small and Grade                            I (internal hemorrhoids that do not prolapse).                           The ascending colon revealed significantly  excessive looping. Advancing the scope required                            straightening and shortening the scope to obtain                            bowel loop reduction and applying abdominal                            pressure. Complications:            No immediate complications. Estimated Blood Loss:     Estimated blood loss was minimal. Impression:               - Many 1 to 3 mm polyps in the transverse colon and                            in the ascending colon, removed with a cold snare.                            Resected and retrieved.                           - Many 1 to 3 mm polyps in the descending colon,                            removed with a cold snare. Resected and retrieved.                           - Many 1 to 3 mm polyps at the recto-sigmoid colon                            and in the sigmoid colon, removed with a cold                            snare. Resected and retrieved.                           - Non-bleeding internal hemorrhoids.                           - There was significant looping of the colon. Recommendation:           - Patient has a contact number available for                            emergencies. The signs and symptoms of potential                            delayed complications were discussed with the                            patient. Return to normal activities tomorrow.  Written discharge instructions were provided to the                            patient.                           - Resume previous diet.                            - Continue present medications.                           - Await pathology results.                           - Repeat colonoscopy for surveillance based on                            pathology results.                           - Return to GI office PRN. Sandor Flatter, MD 05/31/2024 2:36:54 PM

## 2024-05-31 NOTE — Progress Notes (Signed)
 Called to room to assist during endoscopic procedure.  Patient ID and intended procedure confirmed with present staff. Received instructions for my participation in the procedure from the performing physician.

## 2024-05-31 NOTE — Progress Notes (Signed)
 GASTROENTEROLOGY PROCEDURE H&P NOTE   Primary Care Physician: [name], MD    Reason for Procedure:  Hematochezia, change in bowel habits, family history of colon cancer, personal history of colon polyps  Plan:    Colonoscopy  Patient is appropriate for endoscopic procedure(s) in the ambulatory ([facility]) setting.  The nature of the procedure, as well as the risks, benefits, and alternatives were carefully and thoroughly reviewed with the patient. Ample time for discussion and questions allowed. The patient understood, was satisfied, and agreed to proceed.     HPI: Paul Griffin is a 58 y.o. male who presents for colonoscopy for evaluation of hematochezia, change in bowel habits.  Does have a family history of colon cancer and a personal history of colon polyps.  Last colonoscopy was 05/2022 and notable for benign polyps with recommendation to repeat in 5 years (unless change in GI symptoms).  Abdominal x-ray on 04/25/2024 with moderate retained stool in ascending colon with small volume stool throughout the remainder of the colon and was recommended to start MiraLAX  1 cap daily.  Otherwise, no significant changes in clinical history since last OV with Alan Coombs, PA-C on 04/25/2024.  Past Medical History:  Diagnosis Date   Abdominal distension    Anemia    Anxiety    panic attacks   Blood in stool    Cancer (HCC)    prostate cancer   Chronic back pain    neck to sacral (01/01/2013)   Clotting disorder    PE right    Constipation    DDD (degenerative disc disease), cervical    DDD (degenerative disc disease), lumbar    DDD (degenerative disc disease), lumbosacral    DDD (degenerative disc disease), thoracolumbar    Depression    Diarrhea    Difficulty urinating    Dyslipidemia    good cholesterol isn't as high as dr would like (01/01/2013)   Erectile dysfunction    organic   Family history of anesthesia complication    Mom; PONV (01/01/2013)    Family history of colon cancer    Generalized headaches    monthly (01/01/2013)   GERD (gastroesophageal reflux disease)    History of blood transfusion 1980's   related to bleeding ulcers (01/01/2013)   Hoarseness of voice    chronic   Hyperlipidemia    Lipoma of arm 1994   right   Mitral valve prolapse    mild (01/01/2013)   Nasal congestion    Obstructive sleep apnea    miald w aith RDI of 6.4 per hour   Osteoarthritis    hips & hands (01/01/2013)   Palpitations    Peptic ulcer 1994   severe requiring vagotomy and pyloroplasty   PONV (postoperative nausea and vomiting)    Prostate cancer (HCC) 2025   Pulmonary embolism on right 01/01/2013   Rectal bleeding    Trouble swallowing    Tubular adenoma of colon 08/2012   Ulcer    gastric 1988    Past Surgical History:  Procedure Laterality Date   COLONOSCOPY  07/08/2007   showed polyps, follow up in 2013   eight shoulder replacement      EXCISIONAL HEMORRHOIDECTOMY     w/colonoscopies (01/01/2013)   HERNIA REPAIR  12/25/2012   multi inc hernia   INCISIONAL HERNIA REPAIR N/A 12/25/2012   Procedure: LAPAROSCOPIC REPAIR MULITPLE INCISIONAL HERNIA WITH MESH;  Surgeon: Elon CHRISTELLA Pacini, MD;  Location: [facility] OR;  Service: General;  Laterality: N/A;  INSERTION OF MESH N/A 12/25/2012   Procedure: INSERTION OF MESH;  Surgeon: Elon CHRISTELLA Pacini, MD;  Location: [facility] OR;  Service: General;  Laterality: N/A;   LEFT HEART CATH AND CORONARY ANGIOGRAPHY N/A 02/11/2021   Procedure: LEFT HEART CATH AND CORONARY ANGIOGRAPHY;  Surgeon: Elmira Newman PARAS, MD;  Location: [facility] INVASIVE CV LAB;  Service: Cardiovascular;  Laterality: N/A;   LEFT HEART CATHETERIZATION WITH CORONARY ANGIOGRAM N/A 10/15/2013   Procedure: LEFT HEART CATHETERIZATION WITH CORONARY ANGIOGRAM;  Surgeon: Erick JONELLE Bergamo, MD;  Location: [facility] CATH LAB;  Service: Cardiovascular;  Laterality: N/A;   LUMBAR FUSION     2020?   POSTERIOR CERVICAL FUSION/FORAMINOTOMY   09/06/2010   fusion   REVERSE SHOULDER ARTHROPLASTY Right 07/28/2023   Procedure: REVERSE SHOULDER ARTHROPLASTY;  Surgeon: Dozier Soulier, MD;  Location: [facility] ORS;  Service: Orthopedics;  Laterality: Right;   ROBOT ASSISTED LAPAROSCOPIC RADICAL PROSTATECTOMY N/A 01/25/2024   Procedure: PROSTATECTOMY, RADICAL, ROBOT-ASSISTED, LAPAROSCOPIC;  Surgeon: Alvaro Ricardo KATHEE Mickey., MD;  Location: [facility] ORS;  Service: Urology;  Laterality: N/A;   ROTATOR CUFF REPAIR Right 2023   THORACIC AORTOGRAM N/A 02/11/2021   Procedure: THORACIC AORTOGRAM;  Surgeon: Elmira Newman PARAS, MD;  Location: [facility] INVASIVE CV LAB;  Service: Cardiovascular;  Laterality: N/A;   TONSILLECTOMY  09/06/2006   VAGOTOMY AND PYLOROPLASTY  09/06/1986   partial; for ulcers (01/01/2013)    Prior to Admission medications   Medication Sig Start Date End Date Taking? Authorizing Provider  celecoxib  (CELEBREX ) 200 MG capsule Take 200 mg by mouth 2 (two) times daily.    [provider]  cephALEXin  (KEFLEX ) 500 MG capsule Take 1 capsule (500 mg total) by mouth 2 (two) times daily. X 3 days. Begin day before next Urology appointment. 01/26/24   Alvaro Ricardo KATHEE Mickey., MD  gabapentin  (NEURONTIN ) 100 MG capsule Take 100 mg by mouth at bedtime. 01/26/21   [provider]  hydrocortisone  (ANUSOL -HC) 2.5 % rectal cream Place 1 Application rectally 2 (two) times daily. 04/25/24   Craig Alan JONELLE, PA-C  hydrocortisone  (ANUSOL -HC) 25 MG suppository Place 1 suppository (25 mg total) rectally 2 (two) times daily. 04/25/24   Craig Alan JONELLE, PA-C  LORazepam  (ATIVAN ) 0.5 MG tablet Take 0.5 mg by mouth 2 (two) times daily as needed for anxiety. 11/29/17   [provider]  methocarbamol  (ROBAXIN ) 750 MG tablet Take 1 tablet (750 mg total) by mouth every 8 (eight) hours as needed for muscle spasms. 07/28/23   [place], Hospital doctor, PA-C  Multiple Vitamins-Minerals (CENTRUM SILVER 50+MEN PO) Take by mouth.    [provider]   omeprazole  (PRILOSEC) 40 MG capsule TAKE 1 CAPSULE(40 MG) BY MOUTH DAILY BEFORE BREAKFAST 02/14/23   Esterwood, Amy S, PA-C  oxyCODONE -acetaminophen  (PERCOCET) 5-325 MG tablet Take 1 tablet by mouth every 4 (four) hours as needed for severe pain (pain score 7-10) or moderate pain (pain score 4-6) (post-op pain). 01/26/24 01/25/25  Alvaro Ricardo KATHEE Mickey., MD  rosuvastatin (CRESTOR) 20 MG tablet Take 20 mg by mouth every evening.    [provider]  senna-docusate (SENOKOT-S) 8.6-50 MG tablet Take 1 tablet by mouth 2 (two) times daily. While taking strong pain meds to prevent constipation 01/26/24   Alvaro Ricardo KATHEE Mickey., MD  sertraline  (ZOLOFT ) 100 MG tablet Take 100 mg by mouth in the morning. 08/08/12   [provider]    Current Outpatient Medications  Medication Sig Dispense Refill   celecoxib  (CELEBREX ) 200 MG capsule Take 200 mg by mouth 2 (  two) times daily.     cephALEXin  (KEFLEX ) 500 MG capsule Take 1 capsule (500 mg total) by mouth 2 (two) times daily. X 3 days. Begin day before next Urology appointment. 6 capsule 0   gabapentin  (NEURONTIN ) 100 MG capsule Take 100 mg by mouth at bedtime.     hydrocortisone  (ANUSOL -HC) 2.5 % rectal cream Place 1 Application rectally 2 (two) times daily. 30 g 2   hydrocortisone  (ANUSOL -HC) 25 MG suppository Place 1 suppository (25 mg total) rectally 2 (two) times daily. 12 suppository 0   LORazepam  (ATIVAN ) 0.5 MG tablet Take 0.5 mg by mouth 2 (two) times daily as needed for anxiety.  3   methocarbamol  (ROBAXIN ) 750 MG tablet Take 1 tablet (750 mg total) by mouth every 8 (eight) hours as needed for muscle spasms. 30 tablet 0   Multiple Vitamins-Minerals (CENTRUM SILVER 50+MEN PO) Take by mouth.     omeprazole  (PRILOSEC) 40 MG capsule TAKE 1 CAPSULE(40 MG) BY MOUTH DAILY BEFORE BREAKFAST 30 capsule 11   oxyCODONE -acetaminophen  (PERCOCET) 5-325 MG tablet Take 1 tablet by mouth every 4 (four) hours as needed for severe pain (pain score 7-10) or  moderate pain (pain score 4-6) (post-op pain). 20 tablet 0   rosuvastatin (CRESTOR) 20 MG tablet Take 20 mg by mouth every evening.     senna-docusate (SENOKOT-S) 8.6-50 MG tablet Take 1 tablet by mouth 2 (two) times daily. While taking strong pain meds to prevent constipation 10 tablet 0   sertraline  (ZOLOFT ) 100 MG tablet Take 100 mg by mouth in the morning.     Current Facility-Administered Medications  Medication Dose Route Frequency Provider Last Rate Last Admin   0.9 %  sodium chloride  infusion  500 mL Intravenous Once Darina Hartwell V, DO        Allergies as of 05/31/2024 - Review Complete 05/31/2024  Allergen Reaction Noted   Buprenorphine hcl Other (See Comments) 11/22/2014   Levitra [vardenafil] Other (See Comments) 05/18/2021   Morphine and codeine Other (See Comments) 08/14/2012    Family History  Problem Relation Age of Onset   Colon cancer Mother    Lymphoma Mother        non- hodgkins   COPD Mother    Melanoma Mother    Colon polyps Mother    Kidney cancer Father    Heart disease Father    Colon polyps Father    Ovarian cancer Sister    Ulcerative colitis Sister    Colon polyps Sister    Breast cancer Sister    Colon polyps Sister    Cancer Sister        brain   Colon polyps Sister    Colon polyps Sister    Colon polyps Sister    Colon polyps Brother    Colon polyps Brother    Rectal cancer Neg Hx    Stomach cancer Neg Hx    Esophageal cancer Neg Hx     Social History   Socioeconomic History   Marital status: Married    Spouse name: Not on file   Number of children: 2   Years of education: Not on file   Highest education level: Not on file  Occupational History    Employer: [employer]  Tobacco Use   Smoking status: Never    Passive exposure: Never   Smokeless tobacco: Never  Vaping Use   Vaping status: Never Used  Substance and Sexual Activity   Alcohol use: Yes    Comment: rare   Drug  use: No   Sexual activity: Yes  Other Topics Concern    Not on file  Social History Narrative   Not on file   Social Drivers of Health   Financial Resource Strain: Not on file  Food Insecurity: No Food Insecurity (01/25/2024)   Hunger Vital Sign    Worried About Running Out of Food in the Last Year: Never true    Ran Out of Food in the Last Year: Never true  Transportation Needs: No Transportation Needs (01/25/2024)   PRAPARE - Administrator, Civil Service (Medical): No    Lack of Transportation (Non-Medical): No  Physical Activity: Not on file  Stress: Not on file  Social Connections: Unknown (01/18/2022)   [facility]   Social Network    Social Network: Not on file  Intimate Partner Violence: Not At Risk (02/22/2024)   Received from [facility]   Humiliation, Afraid, Rape, and Kick questionnaire    Within the last year, have you been afraid of your partner or ex-partner?: No    Within the last year, have you been humiliated or emotionally abused in other ways by your partner or ex-partner?: No    Within the last year, have you been kicked, hit, slapped, or otherwise physically hurt by your partner or ex-partner?: No    Within the last year, have you been raped or forced to have any kind of sexual activity by your partner or ex-partner?: No    Physical Exam: Vital signs in last 24 hours: @BP  120/69   Pulse 74   Temp 98.1 F (36.7 C)   Ht 5' 11 (1.803 m)   Wt 246 lb (111.6 kg)   SpO2 98%   BMI 34.31 kg/m  GEN: NAD EYE: Sclerae anicteric ENT: MMM CV: Non-tachycardic Pulm: CTA b/l GI: Soft, NT/ND NEURO:  Alert & Oriented x 3   Sandor Flatter, DO [facility]   05/31/2024 1:09 PM

## 2024-05-31 NOTE — Progress Notes (Signed)
 Report to PACU, RN, vss, BBS= Clear.

## 2024-06-01 ENCOUNTER — Telehealth: Payer: Self-pay

## 2024-06-01 NOTE — Telephone Encounter (Signed)
 Post procedure follow up call, no answer

## 2024-06-06 LAB — SURGICAL PATHOLOGY

## 2024-06-13 ENCOUNTER — Ambulatory Visit: Payer: Self-pay | Admitting: Gastroenterology
